# Patient Record
Sex: Male | Born: 1990
Health system: Southern US, Community
[De-identification: ages and names within clinical notes are randomized; demographics above are authoritative.]

## PROBLEM LIST (undated history)

## (undated) DIAGNOSIS — N200 Calculus of kidney: Secondary | ICD-10-CM

## (undated) DIAGNOSIS — Z87442 Personal history of urinary calculi: Secondary | ICD-10-CM

## (undated) DIAGNOSIS — F329 Major depressive disorder, single episode, unspecified: Secondary | ICD-10-CM

## (undated) DIAGNOSIS — Z8619 Personal history of other infectious and parasitic diseases: Secondary | ICD-10-CM

## (undated) DIAGNOSIS — B20 Human immunodeficiency virus [HIV] disease: Secondary | ICD-10-CM

## (undated) HISTORY — DX: Major depressive disorder, single episode, unspecified: F32.9

## (undated) HISTORY — DX: Personal history of other infectious and parasitic diseases: Z86.19

## (undated) HISTORY — DX: Human immunodeficiency virus (HIV) disease: B20

## (undated) HISTORY — PX: NOSE SURGERY: SHX723

## (undated) HISTORY — PX: FINGER SURGERY: SHX640

---
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
## 2013-01-10 ENCOUNTER — Encounter (HOSPITAL_COMMUNITY): Payer: Self-pay | Admitting: Emergency Medicine

## 2013-01-10 ENCOUNTER — Emergency Department (INDEPENDENT_AMBULATORY_CARE_PROVIDER_SITE_OTHER)
Admission: EM | Admit: 2013-01-10 | Discharge: 2013-01-10 | Disposition: A | Discharge status: Home / Self Care | Payer: Medicaid Other | Source: Home / Self Care | Attending: Family Medicine | Admitting: Family Medicine

## 2013-01-10 DIAGNOSIS — J069 Acute upper respiratory infection, unspecified: Secondary | ICD-10-CM

## 2013-01-10 DIAGNOSIS — B9789 Other viral agents as the cause of diseases classified elsewhere: Principal | ICD-10-CM

## 2013-01-10 MED ORDER — IPRATROPIUM BROMIDE 0.06 % NA SOLN: 2.0000 | Freq: Four times a day (QID) | NASAL | Status: DC

## 2013-01-10 MED ORDER — HYDROCODONE-ACETAMINOPHEN 5-325 MG PO TABS: 0.5000 | ORAL_TABLET | Freq: Every evening | ORAL | Status: DC | PRN

## 2013-01-10 NOTE — ED Provider Notes (Signed)
Steven [name] is a 23 y.o. male who presents to Urgent Care today for 2 weeks of cough congestion . No nausea vomiting diarrhea and no shortness of breath. Multiple sick contacts. No fevers or chills. Patient has tried multiple over-the-counter medications which have not helped much. He feels well otherwise.   History reviewed. No pertinent past medical history. History  Substance Use Topics  . Smoking status: Current Every Day Smoker    Types: Cigarettes  . Smokeless tobacco: Not on file  . Alcohol Use: Yes     Comment: occasional   ROS as above Medications reviewed. No current facility-administered medications for this encounter.   Current Outpatient Prescriptions  Medication Sig Dispense Refill  . HYDROcodone-acetaminophen (NORCO/VICODIN) 5-325 MG per tablet Take 0.5 tablets by mouth at bedtime as needed (cough).  6 tablet  0  . ipratropium (ATROVENT) 0.06 % nasal spray Place 2 sprays into both nostrils 4 (four) times daily.  15 mL  1  . lisdexamfetamine (VYVANSE) 40 MG capsule Take 40 mg by mouth every morning.          Exam:  BP 129/72  Pulse 77  Temp(Src) 98.6 F (37 C) (Oral)  SpO2 100% Gen: Well NAD HEENT: EOMI,  MMM posterior pharynx with cobblestoning. Tympanic membranes are normal appearing bilaterally. Lungs: Normal work of breathing. CTABL Heart: RRR no MRG Abd: NABS, Soft. NT, ND Exts: Non edematous BL  LE, warm and well perfused.    Assessment and Plan: 23 y.o. male with postviral cough. Plan treatment with Atrovent nasal spray and Norco cough medication. Followup with primary care provider if not getting better. Over-the-counter medications as needed. Discussed warning signs or symptoms. Please see discharge instructions. Patient expresses understanding.      Rodolph BongEvan S Halden Phegley, MD 01/10/13 [phone]

## 2013-01-10 NOTE — Discharge Instructions (Signed)
Thank you for coming in today. Use Atrovent nasal spray. Use one half tablet of Norco at night for cough as needed. Take 2 Aleve twice daily for pain fevers chills bodyaches. Call or go to the emergency room if you get worse, have trouble breathing, have chest pains, or palpitations.

## 2013-01-10 NOTE — ED Notes (Signed)
C/o  Persistent productive cough and chest congestion since 12/15. SOB. No relief with otc meds.  Denies fever but states felt really hot/feverish yesterday 1/5. No n/v/d

## 2013-03-11 ENCOUNTER — Emergency Department (INDEPENDENT_AMBULATORY_CARE_PROVIDER_SITE_OTHER)
Admission: EM | Admit: 2013-03-11 | Discharge: 2013-03-11 | Disposition: A | Discharge status: Home / Self Care | Payer: Medicaid Other | Source: Home / Self Care | Attending: Family Medicine | Admitting: Family Medicine

## 2013-03-11 ENCOUNTER — Emergency Department (INDEPENDENT_AMBULATORY_CARE_PROVIDER_SITE_OTHER): Payer: Medicaid Other

## 2013-03-11 ENCOUNTER — Encounter (HOSPITAL_COMMUNITY): Payer: Self-pay | Admitting: Emergency Medicine

## 2013-03-11 DIAGNOSIS — M549 Dorsalgia, unspecified: Secondary | ICD-10-CM

## 2013-03-11 LAB — POCT URINALYSIS DIP (DEVICE)
Bilirubin Urine: NEGATIVE
Glucose, UA: NEGATIVE mg/dL
Hgb urine dipstick: NEGATIVE
Ketones, ur: NEGATIVE mg/dL
Leukocytes, UA: NEGATIVE
Nitrite: NEGATIVE
Protein, ur: NEGATIVE mg/dL
Specific Gravity, Urine: 1.015 (ref 1.005–1.030)
Urobilinogen, UA: 0.2 mg/dL (ref 0.0–1.0)
pH: 8.5 — ABNORMAL HIGH (ref 5.0–8.0)

## 2013-03-11 MED ORDER — IBUPROFEN 800 MG PO TABS
800.0000 mg | ORAL_TABLET | Freq: Once | ORAL | Status: AC
  Administered 2013-03-11: 800 mg via ORAL

## 2013-03-11 MED ORDER — IBUPROFEN 800 MG PO TABS
ORAL_TABLET | ORAL | Status: AC
  Filled 2013-03-11: qty 1

## 2013-03-11 MED ORDER — DICLOFENAC SODIUM 50 MG PO TBEC: 50.0000 mg | DELAYED_RELEASE_TABLET | Freq: Three times a day (TID) | ORAL | Status: DC

## 2013-03-11 NOTE — Discharge Instructions (Signed)
Back Pain, Adult Low back pain is very common. About 1 in 5 people have back pain.The cause of low back pain is rarely dangerous. The pain often gets better over time.About half of people with a sudden onset of back pain feel better in just 2 weeks. About 8 in 10 people feel better by 6 weeks.  CAUSES Some common causes of back pain include:  Strain of the muscles or ligaments supporting the spine.  Wear and tear (degeneration) of the spinal discs.  Arthritis.  Direct injury to the back. DIAGNOSIS Most of the time, the direct cause of low back pain is not known.However, back pain can be treated effectively even when the exact cause of the pain is unknown.Answering your caregiver's questions about your overall health and symptoms is one of the most accurate ways to make sure the cause of your pain is not dangerous. If your caregiver needs more information, he or she may order lab work or imaging tests (X-rays or MRIs).However, even if imaging tests show changes in your back, this usually does not require surgery. HOME CARE INSTRUCTIONS For many people, back pain returns.Since low back pain is rarely dangerous, it is often a condition that people can learn to manageon their own.   Remain active. It is stressful on the back to sit or stand in one place. Do not sit, drive, or stand in one place for more than 30 minutes at a time. Take short walks on level surfaces as soon as pain allows.Try to increase the length of time you walk each day.  Do not stay in bed.Resting more than 1 or 2 days can delay your recovery.  Do not avoid exercise or work.Your body is made to move.It is not dangerous to be active, even though your back may hurt.Your back will likely heal faster if you return to being active before your pain is gone.  Pay attention to your body when you bend and lift. Many people have less discomfortwhen lifting if they bend their knees, keep the load close to their bodies,and  avoid twisting. Often, the most comfortable positions are those that put less stress on your recovering back.  Find a comfortable position to sleep. Use a firm mattress and lie on your side with your knees slightly bent. If you lie on your back, put a pillow under your knees.  Only take over-the-counter or prescription medicines as directed by your caregiver. Over-the-counter medicines to reduce pain and inflammation are often the most helpful.Your caregiver may prescribe muscle relaxant drugs.These medicines help dull your pain so you can more quickly return to your normal activities and healthy exercise.  Put ice on the injured area.  Put ice in a plastic bag.  Place a towel between your skin and the bag.  Leave the ice on for 15-20 minutes, 03-04 times a day for the first 2 to 3 days. After that, ice and heat may be alternated to reduce pain and spasms.  Ask your caregiver about trying back exercises and gentle massage. This may be of some benefit.  Avoid feeling anxious or stressed.Stress increases muscle tension and can worsen back pain.It is important to recognize when you are anxious or stressed and learn ways to manage it.Exercise is a great option. SEEK MEDICAL CARE IF:  You have pain that is not relieved with rest or medicine.  You have pain that does not improve in 1 week.  You have new symptoms.  You are generally not feeling well. SEEK   IMMEDIATE MEDICAL CARE IF:   You have pain that radiates from your back into your legs.  You develop new bowel or bladder control problems.  You have unusual weakness or numbness in your arms or legs.  You develop nausea or vomiting.  You develop abdominal pain.  You feel faint. Document Released: 12/22/2004 Document Revised: 06/23/2011 Document Reviewed: 05/12/2010 [employer] Patient Information 2014 [employer]. Back Exercises Back exercises help treat and prevent back injuries. The goal of back exercises is to increase  the strength of your abdominal and back muscles and the flexibility of your back. These exercises should be started when you no longer have back pain. Back exercises include:  Pelvic Tilt. Lie on your back with your knees bent. Tilt your pelvis until the lower part of your back is against the floor. Hold this position 5 to 10 sec and repeat 5 to 10 times.  Knee to Chest. Pull first 1 knee up against your chest and hold for 20 to 30 seconds, repeat this with the other knee, and then both knees. This may be done with the other leg straight or bent, whichever feels better.  Sit-Ups or Curl-Ups. Bend your knees 90 degrees. Start with tilting your pelvis, and do a partial, slow sit-up, lifting your trunk only 30 to 45 degrees off the floor. Take at least 2 to 3 seconds for each sit-up. Do not do sit-ups with your knees out straight. If partial sit-ups are difficult, simply do the above but with only tightening your abdominal muscles and holding it as directed.  Hip-Lift. Lie on your back with your knees flexed 90 degrees. Push down with your feet and shoulders as you raise your hips a couple inches off the floor; hold for 10 seconds, repeat 5 to 10 times.  Back arches. Lie on your stomach, propping yourself up on bent elbows. Slowly press on your hands, causing an arch in your low back. Repeat 3 to 5 times. Any initial stiffness and discomfort should lessen with repetition over time.  Shoulder-Lifts. Lie face down with arms beside your body. Keep hips and torso pressed to floor as you slowly lift your head and shoulders off the floor. Do not overdo your exercises, especially in the beginning. Exercises may cause you some mild back discomfort which lasts for a few minutes; however, if the pain is more severe, or lasts for more than 15 minutes, do not continue exercises until you see your caregiver. Improvement with exercise therapy for back problems is slow.  See your caregivers for assistance with  developing a proper back exercise program. Document Released: 01/30/2004 Document Revised: 03/16/2011 Document Reviewed: 10/23/2010 [employer] Patient Information 2014 [employer].  

## 2013-03-11 NOTE — ED Provider Notes (Signed)
CSN: [id number]     Arrival date & time 03/11/13  1522 History   First MD Initiated Contact with Patient 03/11/13 1729     Chief Complaint  Patient presents with  . Back Pain   (Consider location/radiation/quality/duration/timing/severity/associated sxs/prior Treatment) HPI Comments: 23 year old male presents for evaluation of lower back pain. This is been present for 3 days now. He has been taking over-the-counter medications without relief. He denies any injury. The pain is localized to his entire lower back. It does not radiate down his legs. He has no numbness in the genital area. No loss of bowel or bladder control.  Patient is a 23 y.o. male presenting with back pain.  Back Pain Associated symptoms: no abdominal pain, no chest pain, no dysuria, no fever, no numbness and no weakness     History reviewed. No pertinent past medical history. History reviewed. No pertinent past surgical history. History reviewed. No pertinent family history. History  Substance Use Topics  . Smoking status: Current Every Day Smoker    Types: Cigarettes  . Smokeless tobacco: Not on file  . Alcohol Use: Yes     Comment: occasional    Review of Systems  Constitutional: Negative for fever, chills and fatigue.  HENT: Negative for sore throat.   Eyes: Negative for visual disturbance.  Respiratory: Negative for cough and shortness of breath.   Cardiovascular: Negative for chest pain, palpitations and leg swelling.  Gastrointestinal: Negative for nausea, vomiting, abdominal pain, diarrhea and constipation.  Genitourinary: Negative for dysuria, urgency, frequency and hematuria.  Musculoskeletal: Positive for back pain. Negative for arthralgias, myalgias, neck pain and neck stiffness.  Skin: Negative for rash.  Neurological: Negative for dizziness, weakness, light-headedness and numbness.    Allergies  Review of patient's allergies indicates no known allergies.  Home Medications   Current Outpatient  Rx  Name  Route  Sig  Dispense  Refill  . diclofenac (VOLTAREN) 50 MG EC tablet   Oral   Take 1 tablet (50 mg total) by mouth 3 (three) times daily.   30 tablet   0   . HYDROcodone-acetaminophen (NORCO/VICODIN) 5-325 MG per tablet   Oral   Take 0.5 tablets by mouth at bedtime as needed (cough).   6 tablet   0   . ipratropium (ATROVENT) 0.06 % nasal spray   Each Nare   Place 2 sprays into both nostrils 4 (four) times daily.   15 mL   1   . lisdexamfetamine (VYVANSE) 40 MG capsule   Oral   Take 40 mg by mouth every morning.            BP 117/58  Pulse 83  Temp(Src) 98.7 F (37.1 C) (Oral)  Resp 20  SpO2 100% Physical Exam  Nursing note and vitals reviewed. Constitutional: He is oriented to person, place, and time. He appears well-developed and well-nourished. No distress.  HENT:  Head: Normocephalic.  Cardiovascular: Normal rate and regular rhythm.  Exam reveals friction rub.   No murmur heard. Pulmonary/Chest: Effort normal and breath sounds normal. No respiratory distress. He has no wheezes. He has no rales.  Musculoskeletal:       Lumbar back: He exhibits decreased range of motion, tenderness and bony tenderness.  Positive Waddell's signs - winces in pain when i lightly touched the skin of thoracic spine, but later denies pain in that area with palpation.  Cringes in severe pain with minimal extension of the legs with checking SLR but then observed walking with no difficulty.  Neurological: He is alert and oriented to person, place, and time. He has normal strength. He displays normal reflexes. No cranial nerve deficit or sensory deficit. He exhibits normal muscle tone. He displays a negative Romberg sign. Coordination and gait normal. GCS eye subscore is 4. GCS verbal subscore is 5. GCS motor subscore is 6.  Skin: Skin is warm and dry. No rash noted. He is not diaphoretic.  Psychiatric: He has a normal mood and affect. Judgment normal.    ED Course  Procedures  (including critical care time) Labs Review Labs Reviewed  POCT URINALYSIS DIP (DEVICE) - Abnormal; Notable for the following:    pH 8.5 (*)    All other components within normal limits   Imaging Review Dg Lumbar Spine Complete  03/11/2013   CLINICAL DATA:  Low back pain for 2 days.  EXAM: LUMBAR SPINE - COMPLETE 4+ VIEW  COMPARISON:  None.  FINDINGS: Mild levoscoliosis with the apex at L2. No fracture or spondylolisthesis. No degenerative changes. Normal soft tissues.  IMPRESSION: Mild levoscoliosis.  No other abnormality.   Electronically Signed   By: Amie Portland M.D.   On: 03/11/2013 18:30     MDM   1. Back pain    Mild levoscoliosis on the x-ray, otherwise normal. He is well appearing with normal vital signs, afebrile. No red flag symptoms. Followup with orthopedics if not improving. ED if he continues to worsen.   Meds ordered this encounter  Medications  . ibuprofen (ADVIL,MOTRIN) tablet 800 mg    Sig:   . diclofenac (VOLTAREN) 50 MG EC tablet    Sig: Take 1 tablet (50 mg total) by mouth 3 (three) times daily.    Dispense:  30 tablet    Refill:  0    Order Specific Question:  Supervising Provider    Answer:  Bradd Canary D [[id number]]       Graylon Good, PA-C 03/11/13 [phone]

## 2013-03-11 NOTE — ED Notes (Signed)
C/o  Severe lower back pain.   Pain is mainly in the center of the back.  Denies injury or heavy lifting/denies urinary symptoms.  On set 3 days ago.  No relief with otc meds.

## 2013-03-11 NOTE — ED Provider Notes (Signed)
Medical screening examination/treatment/procedure(s) were performed by resident physician or non-physician practitioner and as supervising physician I was immediately available for consultation/collaboration.   Ramiel Forti DOUGLAS MD.   Sefora Tietje D Kamie Korber, MD 03/11/13 1954 

## 2014-11-23 ENCOUNTER — Emergency Department (INDEPENDENT_AMBULATORY_CARE_PROVIDER_SITE_OTHER)
Admission: EM | Admit: 2014-11-23 | Discharge: 2014-11-23 | Disposition: A | Discharge status: Home / Self Care | Payer: Medicaid Other | Source: Home / Self Care

## 2014-11-23 ENCOUNTER — Encounter (HOSPITAL_COMMUNITY): Payer: Self-pay | Admitting: Emergency Medicine

## 2014-11-23 DIAGNOSIS — L259 Unspecified contact dermatitis, unspecified cause: Secondary | ICD-10-CM | POA: Diagnosis not present

## 2014-11-23 MED ORDER — PREDNISONE 20 MG PO TABS: ORAL_TABLET | ORAL | Status: DC

## 2014-11-23 NOTE — Discharge Instructions (Signed)
Contact Dermatitis Recommend taken in any histamines such as Allegra or Claritin daily. Dermatitis is redness, soreness, and swelling (inflammation) of the skin. Contact dermatitis is a reaction to certain substances that touch the skin. There are two types of contact dermatitis:   Irritant contact dermatitis. This type is caused by something that irritates your skin, such as dry hands from washing them too much. This type does not require previous exposure to the substance for a reaction to occur. This type is more common.  Allergic contact dermatitis. This type is caused by a substance that you are allergic to, such as a nickel allergy or poison ivy. This type only occurs if you have been exposed to the substance (allergen) before. Upon a repeat exposure, your body reacts to the substance. This type is less common. CAUSES  Many different substances can cause contact dermatitis. Irritant contact dermatitis is most commonly caused by exposure to:   Makeup.   Soaps.   Detergents.   Bleaches.   Acids.   Metal salts, such as nickel.  Allergic contact dermatitis is most commonly caused by exposure to:   Poisonous plants.   Chemicals.   Jewelry.   Latex.   Medicines.   Preservatives in products, such as clothing.  RISK FACTORS This condition is more likely to develop in:   People who have jobs that expose them to irritants or allergens.  People who have certain medical conditions, such as asthma or eczema.  SYMPTOMS  Symptoms of this condition may occur anywhere on your body where the irritant has touched you or is touched by you. Symptoms include:  Dryness or flaking.   Redness.   Cracks.   Itching.   Pain or a burning feeling.   Blisters.  Drainage of small amounts of blood or clear fluid from skin cracks. With allergic contact dermatitis, there may also be swelling in areas such as the eyelids, mouth, or genitals.  DIAGNOSIS  This condition is  diagnosed with a medical history and physical exam. A patch skin test may be performed to help determine the cause. If the condition is related to your job, you may need to see an occupational medicine specialist. TREATMENT Treatment for this condition includes figuring out what caused the reaction and protecting your skin from further contact. Treatment may also include:   Steroid creams or ointments. Oral steroid medicines may be needed in more severe cases.  Antibiotics or antibacterial ointments, if a skin infection is present.  Antihistamine lotion or an antihistamine taken by mouth to ease itching.  A bandage (dressing). HOME CARE INSTRUCTIONS Skin Care  Moisturize your skin as needed.   Apply cool compresses to the affected areas.  Try taking a bath with:  Epsom salts. Follow the instructions on the packaging. You can get these at your local pharmacy or grocery store.  Baking soda. Pour a small amount into the bath as directed by your health care provider.  Colloidal oatmeal. Follow the instructions on the packaging. You can get this at your local pharmacy or grocery store.  Try applying baking soda paste to your skin. Stir water into baking soda until it reaches a paste-like consistency.  Do not scratch your skin.  Bathe less frequently, such as every other day.  Bathe in lukewarm water. Avoid using hot water. Medicines  Take or apply over-the-counter and prescription medicines only as told by your health care provider.   If you were prescribed an antibiotic medicine, take or apply your antibiotic as  told by your health care provider. Do not stop using the antibiotic even if your condition starts to improve. General Instructions  Keep all follow-up visits as told by your health care provider. This is important.  Avoid the substance that caused your reaction. If you do not know what caused it, keep a journal to try to track what caused it. Write down:  What you  eat.  What cosmetic products you use.  What you drink.  What you wear in the affected area. This includes jewelry.  If you were given a dressing, take care of it as told by your health care provider. This includes when to change and remove it. SEEK MEDICAL CARE IF:   Your condition does not improve with treatment.  Your condition gets worse.  You have signs of infection such as swelling, tenderness, redness, soreness, or warmth in the affected area.  You have a fever.  You have new symptoms. SEEK IMMEDIATE MEDICAL CARE IF:   You have a severe headache, neck pain, or neck stiffness.  You vomit.  You feel very sleepy.  You notice red streaks coming from the affected area.  Your bone or joint underneath the affected area becomes painful after the skin has healed.  The affected area turns darker.  You have difficulty breathing.   This information is not intended to replace advice given to you by your health care provider. Make sure you discuss any questions you have with your health care provider.   Document Released: 12/20/1999 Document Revised: 09/12/2014 Document Reviewed: 05/09/2014 Elsevier Interactive Patient Education [employer]

## 2014-11-23 NOTE — ED Notes (Signed)
C/o rash all over face, abd, back, chest and arms onset x3 days Denies new meds, hygiene products, foods A&O x4... No acute distress.

## 2014-11-23 NOTE — ED Provider Notes (Signed)
CSN: [id number]     Arrival date & time 11/23/14  1301 History   None    Chief Complaint  Patient presents with  . Rash   (Consider location/radiation/quality/duration/timing/severity/associated sxs/prior Treatment) HPI Comments: 24 year old male presents with a generalized itchy rash. It is almost body surface areas. It occurred approximately 2 days ago. Patient states he is uncertain as to what may caused it. He denies any known swelling. No cough or breathing problems.   History reviewed. No pertinent past medical history. History reviewed. No pertinent past surgical history. No family history on file. Social History  Substance Use Topics  . Smoking status: Current Every Day Smoker    Types: Cigarettes  . Smokeless tobacco: None  . Alcohol Use: Yes     Comment: occasional    Review of Systems  Constitutional: Negative.   HENT: Negative.   Respiratory: Negative.  Negative for cough and shortness of breath.   Cardiovascular: Negative for chest pain.  Skin: Positive for rash.       As per history of present illness  Neurological: Negative.     Allergies  Review of patient's allergies indicates no known allergies.  Home Medications   Prior to Admission medications   Medication Sig Start Date End Date Taking? Authorizing Provider  diclofenac (VOLTAREN) 50 MG EC tablet Take 1 tablet (50 mg total) by mouth 3 (three) times daily. 03/11/13   Graylon GoodZachary H Baker, PA-C  HYDROcodone-acetaminophen (NORCO/VICODIN) 5-325 MG per tablet Take 0.5 tablets by mouth at bedtime as needed (cough). 01/10/13   Rodolph BongEvan S Corey, MD  ipratropium (ATROVENT) 0.06 % nasal spray Place 2 sprays into both nostrils 4 (four) times daily. 01/10/13   Rodolph BongEvan S Corey, MD  lisdexamfetamine (VYVANSE) 40 MG capsule Take 40 mg by mouth every morning.      Historical Provider, MD  predniSONE (DELTASONE) 20 MG tablet 3 Tabs PO Days 1-3, then 2 tabs PO Days 4-6, then 1 tab PO Day 7-9, then Half Tab PO Day 10-12. Take with food.  11/23/14   Hayden Rasmussenavid Vester Titsworth, NP   Meds Ordered and Administered this Visit  Medications - No data to display  BP 114/72 mmHg  Pulse 96  Temp(Src) 99.5 F (37.5 C) (Oral)  Resp 16  SpO2 100% No data found.   Physical Exam  Constitutional: He is oriented to person, place, and time. He appears well-developed and well-nourished. No distress.  HENT:  Mouth/Throat: Oropharynx is clear and moist.  No intraoral swelling, edema, erythema or other discoloration. Swallowing reflex is normal. Airway is widely patent.  Eyes: EOM are normal.  Neck: Normal range of motion.  Cardiovascular: Normal rate and regular rhythm.   Pulmonary/Chest: Effort normal and breath sounds normal. No respiratory distress. He has no wheezes. He has no rales.  Musculoskeletal: Normal range of motion. He exhibits no edema.  Neurological: He is alert and oriented to person, place, and time. He exhibits normal muscle tone.  Skin: Skin is warm and dry. Rash noted.  Generalized papular rash, mostly erythematous. No drainage, purulence or signs of bacterial infection.  Psychiatric: He has a normal mood and affect.  Nursing note and vitals reviewed.   ED Course  Procedures (including critical care time)  Labs Review Labs Reviewed - No data to display  Imaging Review No results found.   Visual Acuity Review  Right Eye Distance:   Left Eye Distance:   Bilateral Distance:    Right Eye Near:   Left Eye Near:  Bilateral Near:         MDM   1. Contact dermatitis    Prednisone taper dose Recommend taken in any histamines such as Allegra or Claritin daily.     Hayden Rasmussen, NP 11/23/14 1350

## 2015-01-24 ENCOUNTER — Emergency Department (HOSPITAL_COMMUNITY): Payer: Medicaid Other

## 2015-01-24 ENCOUNTER — Encounter (HOSPITAL_COMMUNITY): Payer: Self-pay | Admitting: Emergency Medicine

## 2015-01-24 ENCOUNTER — Emergency Department (HOSPITAL_COMMUNITY)
Admission: EM | Admit: 2015-01-24 | Discharge: 2015-01-24 | Disposition: A | Discharge status: Home / Self Care | Payer: Medicaid Other | Attending: Emergency Medicine | Admitting: Emergency Medicine

## 2015-01-24 DIAGNOSIS — F17210 Nicotine dependence, cigarettes, uncomplicated: Secondary | ICD-10-CM | POA: Insufficient documentation

## 2015-01-24 DIAGNOSIS — R1032 Left lower quadrant pain: Secondary | ICD-10-CM | POA: Diagnosis present

## 2015-01-24 DIAGNOSIS — N201 Calculus of ureter: Secondary | ICD-10-CM | POA: Insufficient documentation

## 2015-01-24 HISTORY — DX: Calculus of kidney: N20.0

## 2015-01-24 LAB — URINALYSIS, ROUTINE W REFLEX MICROSCOPIC
Glucose, UA: NEGATIVE mg/dL
Ketones, ur: >80 mg/dL — AB
Leukocytes, UA: NEGATIVE
Nitrite: NEGATIVE
Protein, ur: 100 mg/dL — AB
Specific Gravity, Urine: 1.026 (ref 1.005–1.030)
pH: 5.5 (ref 5.0–8.0)

## 2015-01-24 LAB — COMPREHENSIVE METABOLIC PANEL
ALT: 20 U/L (ref 17–63)
AST: 23 U/L (ref 15–41)
Albumin: 4.8 g/dL (ref 3.5–5.0)
Alkaline Phosphatase: 56 U/L (ref 38–126)
Anion gap: 15 (ref 5–15)
BUN: 9 mg/dL (ref 6–20)
CO2: 19 mmol/L — ABNORMAL LOW (ref 22–32)
Calcium: 9.7 mg/dL (ref 8.9–10.3)
Chloride: 104 mmol/L (ref 101–111)
Creatinine, Ser: 1.17 mg/dL (ref 0.61–1.24)
GFR calc Af Amer: >60 mL/min (ref >60)
GFR calc non Af Amer: >60 mL/min (ref >60)
Glucose, Bld: 146 mg/dL — ABNORMAL HIGH (ref 65–99)
Potassium: 4 mmol/L (ref 3.5–5.1)
Sodium: 138 mmol/L (ref 135–145)
Total Bilirubin: 1.1 mg/dL (ref 0.3–1.2)
Total Protein: 7.3 g/dL (ref 6.5–8.1)

## 2015-01-24 LAB — CBC
HCT: 43.8 % (ref 39.0–52.0)
Hemoglobin: 15.6 g/dL (ref 13.0–17.0)
MCH: 30 pg (ref 26.0–34.0)
MCHC: 35.6 g/dL (ref 30.0–36.0)
MCV: 84.2 fL (ref 78.0–100.0)
Platelets: 146 10*3/uL — ABNORMAL LOW (ref 150–400)
RBC: 5.2 MIL/uL (ref 4.22–5.81)
RDW: 12.7 % (ref 11.5–15.5)
WBC: 8.5 10*3/uL (ref 4.0–10.5)

## 2015-01-24 LAB — URINE MICROSCOPIC-ADD ON

## 2015-01-24 LAB — LIPASE, BLOOD: Lipase: 24 U/L (ref 11–51)

## 2015-01-24 MED ORDER — ONDANSETRON HCL 4 MG/2ML IJ SOLN
4.0000 mg | Freq: Once | INTRAMUSCULAR | Status: AC
  Administered 2015-01-24: 4 mg via INTRAVENOUS
  Filled 2015-01-24: qty 2

## 2015-01-24 MED ORDER — SODIUM CHLORIDE 0.9 % IV SOLN
1000.0000 mL | Freq: Once | INTRAVENOUS | Status: AC
  Administered 2015-01-24: 1000 mL via INTRAVENOUS

## 2015-01-24 MED ORDER — ONDANSETRON HCL 4 MG PO TABS: 4.0000 mg | ORAL_TABLET | Freq: Four times a day (QID) | ORAL | Status: DC

## 2015-01-24 MED ORDER — SODIUM CHLORIDE 0.9 % IV SOLN: 1000.0000 mL | INTRAVENOUS | Status: DC

## 2015-01-24 MED ORDER — HYDROMORPHONE HCL 1 MG/ML IJ SOLN
0.5000 mg | INTRAMUSCULAR | Status: DC | PRN
  Administered 2015-01-24 (×2): 0.5 mg via INTRAVENOUS
  Filled 2015-01-24 (×2): qty 1

## 2015-01-24 MED ORDER — HYDROCODONE-ACETAMINOPHEN 5-325 MG PO TABS: 1.0000 | ORAL_TABLET | ORAL | Status: DC | PRN

## 2015-01-24 MED ORDER — FENTANYL CITRATE (PF) 100 MCG/2ML IJ SOLN
50.0000 ug | Freq: Once | INTRAMUSCULAR | Status: AC
  Administered 2015-01-24: 50 ug via INTRAVENOUS

## 2015-01-24 MED ORDER — ONDANSETRON HCL 4 MG/2ML IJ SOLN
4.0000 mg | Freq: Once | INTRAMUSCULAR | Status: AC
  Administered 2015-01-24: 4 mg via INTRAVENOUS

## 2015-01-24 MED ORDER — ONDANSETRON HCL 4 MG/2ML IJ SOLN
INTRAMUSCULAR | Status: AC
  Administered 2015-01-24: 4 mg via INTRAVENOUS
  Filled 2015-01-24: qty 2

## 2015-01-24 MED ORDER — FENTANYL CITRATE (PF) 100 MCG/2ML IJ SOLN
INTRAMUSCULAR | Status: AC
  Administered 2015-01-24: 50 ug via INTRAVENOUS
  Filled 2015-01-24: qty 2

## 2015-01-24 NOTE — ED Provider Notes (Signed)
CSN: [id number]     Arrival date & time 01/24/15  0804 History   First MD Initiated Contact with Patient 01/24/15 [phone]     Chief Complaint  Patient presents with  . Flank Pain  . Abdominal Pain   Patient is a 25 y.o. male presenting with flank pain and abdominal pain. The history is provided by the patient.  Flank Pain This is a new problem. The current episode started 3 to 5 hours ago. The problem occurs constantly. The problem has not changed since onset.Associated symptoms include abdominal pain. Pertinent negatives include no chest pain, no headaches and no shortness of breath. Nothing aggravates the symptoms. Nothing relieves the symptoms.  Abdominal Pain Pain location:  LLQ Pain quality: sharp   Pain radiates to:  Back Onset quality:  Sudden Relieved by:  Nothing Associated symptoms: hematuria   Associated symptoms: no chest pain, no cough, no diarrhea, no dysuria, no fever, no shortness of breath and no vomiting   Risk factors comment:  Hx of prior kidney stones and this feels similar   Past Medical History  Diagnosis Date  . Kidney stone    History reviewed. No pertinent past surgical history. History reviewed. No pertinent family history. Social History  Substance Use Topics  . Smoking status: Current Every Day Smoker    Types: Cigarettes  . Smokeless tobacco: None  . Alcohol Use: Yes     Comment: occasional    Review of Systems  Constitutional: Negative for fever.  Respiratory: Negative for cough and shortness of breath.   Cardiovascular: Negative for chest pain.  Gastrointestinal: Positive for abdominal pain. Negative for vomiting and diarrhea.  Genitourinary: Positive for hematuria and flank pain. Negative for dysuria.  Neurological: Negative for headaches.  All other systems reviewed and are negative.     Allergies  Review of patient's allergies indicates no known allergies.  Home Medications   Prior to Admission medications   Medication Sig Start  Date End Date Taking? Authorizing Provider  HYDROcodone-acetaminophen (NORCO/VICODIN) 5-325 MG tablet Take 1-2 tablets by mouth every 4 (four) hours as needed. 01/24/15   Linwood Dibbles, MD  ondansetron (ZOFRAN) 4 MG tablet Take 1 tablet (4 mg total) by mouth every 6 (six) hours. 01/24/15   Linwood Dibbles, MD   BP 102/63 mmHg  Pulse 70  Temp(Src) 98.1 F (36.7 C) (Oral)  Resp 18  SpO2 98% Physical Exam  Constitutional: He appears well-developed and well-nourished. No distress.  HENT:  Head: Normocephalic and atraumatic.  Right Ear: External ear normal.  Left Ear: External ear normal.  Eyes: Conjunctivae are normal. Right eye exhibits no discharge. Left eye exhibits no discharge. No scleral icterus.  Neck: Neck supple. No tracheal deviation present.  Cardiovascular: Normal rate, regular rhythm and intact distal pulses.   Pulmonary/Chest: Effort normal and breath sounds normal. No stridor. No respiratory distress. He has no wheezes. He has no rales.  Abdominal: Soft. Bowel sounds are normal. He exhibits no distension. There is generalized tenderness. There is guarding. There is no rigidity and no rebound. No hernia.  Musculoskeletal: He exhibits no edema or tenderness.  Neurological: He is alert. He has normal strength. No cranial nerve deficit (no facial droop, extraocular movements intact, no slurred speech) or sensory deficit. He exhibits normal muscle tone. He displays no seizure activity. Coordination normal.  Skin: Skin is warm and dry. No rash noted.  Psychiatric: He has a normal mood and affect.  Nursing note and vitals reviewed.   ED Course  Procedures (including critical care time) Labs Review Labs Reviewed  COMPREHENSIVE METABOLIC PANEL - Abnormal; Notable for the following:    CO2 19 (*)    Glucose, Bld 146 (*)    All other components within normal limits  CBC - Abnormal; Notable for the following:    Platelets 146 (*)    All other components within normal limits  URINALYSIS,  ROUTINE W REFLEX MICROSCOPIC (NOT AT [facility]) - Abnormal; Notable for the following:    Color, Urine AMBER (*)    APPearance HAZY (*)    Hgb urine dipstick LARGE (*)    Bilirubin Urine SMALL (*)    Ketones, ur >80 (*)    Protein, ur 100 (*)    All other components within normal limits  URINE MICROSCOPIC-ADD ON - Abnormal; Notable for the following:    Squamous Epithelial / LPF 0-5 (*)    Bacteria, UA MANY (*)    All other components within normal limits  LIPASE, BLOOD    Imaging Review Ct Abdomen Pelvis Wo Contrast  01/24/2015  CLINICAL DATA:  Left flank pain with nausea and vomiting for 1 day EXAM: CT ABDOMEN AND PELVIS WITHOUT CONTRAST TECHNIQUE: Multidetector CT imaging of the abdomen and pelvis was performed following the standard protocol without oral or intravenous contrast material administration. COMPARISON:  November 01, 2008 FINDINGS: Lower chest: There is mild right posterior basilar atelectasis. Lung bases otherwise are clear. Hepatobiliary: No focal liver lesions are identified on this noncontrast enhanced study. Gallbladder wall is not appreciably thickened. There is no biliary duct dilatation. Pancreas: No pancreatic mass or inflammation. Spleen: No splenic lesions are identified. Adrenals/Urinary Tract: Adrenals appear normal bilaterally. There is no renal mass on either side. On the right, there is no hydronephrosis. There is no right-sided renal or ureteral calculus. On the left, there is mild hydronephrosis. There is no intrarenal calculus. There is a 2 mm calculus on the left in the proximal ureter at the L2-3 level. No other ureteral calculus is appreciable. Urinary bladder is midline with wall thickness within normal limits. Stomach/Bowel: There is no bowel obstruction. No free air or portal venous air. No bowel wall or mesenteric thickening. Vascular/Lymphatic: There is no abdominal aortic aneurysm. No vascular lesions are identified on this noncontrast enhanced study. There is no  adenopathy in the abdomen or pelvis. There are scattered subcentimeter mesenteric lymph nodes in the right abdomen. Reproductive: Prostate and seminal vesicles appear normal. No pelvic mass or pelvic fluid collection. Other: Appendix appears normal. No abscess or ascites in the abdomen or pelvis. Musculoskeletal: There are no blastic or lytic bone lesions. There are Schmorl's nodes in several lower thoracic vertebral bodies. No intramuscular or abdominal wall lesions. IMPRESSION: 2 mm calculus proximal left ureter at the L2-3 level with mild hydronephrosis on the left. No intrarenal calculi on either side. No right-sided hydronephrosis or ureteral calculus. Several subcentimeter lymph nodes in the right mid to lower abdomen are regarded as nonspecific. No adenopathy by size criteria is appreciable in the abdomen or pelvis. Appendix appears normal.  No bowel obstruction.  No abscess. Electronically Signed   By: Bretta Bang III M.D.   On: 01/24/2015 12:04   I have personally reviewed and evaluated these images and lab results as part of my medical decision-making.  Medications  0.9 %  sodium chloride infusion (1,000 mLs Intravenous New Bag/Given 01/24/15 1015)    Followed by  0.9 %  sodium chloride infusion (1,000 mLs Intravenous New Bag/Given 01/24/15 1016)  Followed by  0.9 %  sodium chloride infusion (not administered)  HYDROmorphone (DILAUDID) injection 0.5 mg (0.5 mg Intravenous Given 01/24/15 1145)  fentaNYL (SUBLIMAZE) injection 50 mcg (50 mcg Intravenous Given 01/24/15 0826)  ondansetron (ZOFRAN) injection 4 mg (4 mg Intravenous Given 01/24/15 0826)  ondansetron (ZOFRAN) injection 4 mg (4 mg Intravenous Given 01/24/15 1016)     MDM   Final diagnoses:  Left ureteral stone    The patient's CT scan shows a 2 mm left-sided ureteral stone. Labs were unremarkable with the exception of hematuria. This is consistent with his history and exam.  The patient's pain improved with treatment in the  emergency room.  Dc home with pain meds and antiemetics, follow up with urology    Linwood Dibbles, MD 01/24/15 1243

## 2015-01-24 NOTE — ED Notes (Signed)
Pt sts left sided flank and abd pain into back with some nausea and diarrhea starting last night; pt sts hx of kidney stones and feels same

## 2015-01-24 NOTE — ED Notes (Signed)
Patient transported to CT 

## 2015-01-24 NOTE — Discharge Instructions (Signed)
Kidney Stones °Kidney stones (urolithiasis) are deposits that form inside your kidneys. The intense pain is caused by the stone moving through the urinary tract. When the stone moves, the ureter goes into spasm around the stone. The stone is usually passed in the urine.  °CAUSES  °· A disorder that makes certain neck glands produce too much parathyroid hormone (primary hyperparathyroidism). °· A buildup of uric acid crystals, similar to gout in your joints. °· Narrowing (stricture) of the ureter. °· A kidney obstruction present at birth (congenital obstruction). °· Previous surgery on the kidney or ureters. °· Numerous kidney infections. °SYMPTOMS  °· Feeling sick to your stomach (nauseous). °· Throwing up (vomiting). °· Blood in the urine (hematuria). °· Pain that usually spreads (radiates) to the groin. °· Frequency or urgency of urination. °DIAGNOSIS  °· Taking a history and physical exam. °· Blood or urine tests. °· CT scan. °· Occasionally, an examination of the inside of the urinary bladder (cystoscopy) is performed. °TREATMENT  °· Observation. °· Increasing your fluid intake. °· Extracorporeal shock wave lithotripsy--This is a noninvasive procedure that uses shock waves to break up kidney stones. °· Surgery may be needed if you have severe pain or persistent obstruction. There are various surgical procedures. Most of the procedures are performed with the use of small instruments. Only small incisions are needed to accommodate these instruments, so recovery time is minimized. °The size, location, and chemical composition are all important variables that will determine the proper choice of action for you. Talk to your health care provider to better understand your situation so that you will minimize the risk of injury to yourself and your kidney.  °HOME CARE INSTRUCTIONS  °· Drink enough water and fluids to keep your urine clear or pale yellow. This will help you to pass the stone or stone fragments. °· Strain  all urine through the provided strainer. Keep all particulate matter and stones for your health care provider to see. The stone causing the pain may be as small as a grain of salt. It is very important to use the strainer each and every time you pass your urine. The collection of your stone will allow your health care provider to analyze it and verify that a stone has actually passed. The stone analysis will often identify what you can do to reduce the incidence of recurrences. °· Only take over-the-counter or prescription medicines for pain, discomfort, or fever as directed by your health care provider. °· Keep all follow-up visits as told by your health care provider. This is important. °· Get follow-up X-rays if required. The absence of pain does not always mean that the stone has passed. It may have only stopped moving. If the urine remains completely obstructed, it can cause loss of kidney function or even complete destruction of the kidney. It is your responsibility to make sure X-rays and follow-ups are completed. Ultrasounds of the kidney can show blockages and the status of the kidney. Ultrasounds are not associated with any radiation and can be performed easily in a matter of minutes. °· Make changes to your daily diet as told by your health care provider. You may be told to: °¨ Limit the amount of salt that you eat. °¨ Eat 5 or more servings of fruits and vegetables each day. °¨ Limit the amount of meat, poultry, fish, and eggs that you eat. °· Collect a 24-hour urine sample as told by your health care provider. You may need to collect another urine sample every 6-12   months. °SEEK MEDICAL CARE IF: °· You experience pain that is progressive and unresponsive to any pain medicine you have been prescribed. °SEEK IMMEDIATE MEDICAL CARE IF:  °· Pain cannot be controlled with the prescribed medicine. °· You have a fever or shaking chills. °· The severity or intensity of pain increases over 18 hours and is not  relieved by pain medicine. °· You develop a new onset of abdominal pain. °· You feel faint or pass out. °· You are unable to urinate. °  °This information is not intended to replace advice given to you by your health care provider. Make sure you discuss any questions you have with your health care provider. °  °Document Released: 12/22/2004 Document Revised: 09/12/2014 Document Reviewed: 05/25/2012 °Elsevier Interactive Patient Education ©2016 Elsevier Inc. ° °

## 2015-01-30 ENCOUNTER — Encounter (HOSPITAL_COMMUNITY): Payer: Self-pay

## 2015-01-30 ENCOUNTER — Emergency Department (HOSPITAL_COMMUNITY)
Admission: EM | Admit: 2015-01-30 | Discharge: 2015-01-30 | Disposition: A | Discharge status: Home / Self Care | Payer: Medicaid Other | Attending: Emergency Medicine | Admitting: Emergency Medicine

## 2015-01-30 DIAGNOSIS — F17210 Nicotine dependence, cigarettes, uncomplicated: Secondary | ICD-10-CM | POA: Diagnosis not present

## 2015-01-30 DIAGNOSIS — N200 Calculus of kidney: Secondary | ICD-10-CM | POA: Insufficient documentation

## 2015-01-30 DIAGNOSIS — R1032 Left lower quadrant pain: Secondary | ICD-10-CM | POA: Diagnosis present

## 2015-01-30 DIAGNOSIS — R112 Nausea with vomiting, unspecified: Secondary | ICD-10-CM

## 2015-01-30 LAB — BASIC METABOLIC PANEL
Anion gap: 10 (ref 5–15)
BUN: 8 mg/dL (ref 6–20)
CO2: 25 mmol/L (ref 22–32)
Calcium: 9.1 mg/dL (ref 8.9–10.3)
Chloride: 102 mmol/L (ref 101–111)
Creatinine, Ser: 1.05 mg/dL (ref 0.61–1.24)
GFR calc Af Amer: >60 mL/min (ref >60)
GFR calc non Af Amer: >60 mL/min (ref >60)
Glucose, Bld: 104 mg/dL — ABNORMAL HIGH (ref 65–99)
Potassium: 3.6 mmol/L (ref 3.5–5.1)
Sodium: 137 mmol/L (ref 135–145)

## 2015-01-30 LAB — URINALYSIS, ROUTINE W REFLEX MICROSCOPIC
Bilirubin Urine: NEGATIVE
Glucose, UA: NEGATIVE mg/dL
Ketones, ur: 40 mg/dL — AB
Leukocytes, UA: NEGATIVE
Nitrite: NEGATIVE
Protein, ur: NEGATIVE mg/dL
Specific Gravity, Urine: 1.013 (ref 1.005–1.030)
pH: 6 (ref 5.0–8.0)

## 2015-01-30 LAB — CBC WITH DIFFERENTIAL/PLATELET
Basophils Absolute: 0 10*3/uL (ref 0.0–0.1)
Basophils Relative: 1 %
Eosinophils Absolute: 0.1 10*3/uL (ref 0.0–0.7)
Eosinophils Relative: 2 %
HCT: 41.9 % (ref 39.0–52.0)
Hemoglobin: 14.4 g/dL (ref 13.0–17.0)
Lymphocytes Relative: 37 %
Lymphs Abs: 1.9 10*3/uL (ref 0.7–4.0)
MCH: 29.2 pg (ref 26.0–34.0)
MCHC: 34.4 g/dL (ref 30.0–36.0)
MCV: 85 fL (ref 78.0–100.0)
Monocytes Absolute: 0.6 10*3/uL (ref 0.1–1.0)
Monocytes Relative: 11 %
Neutro Abs: 2.6 10*3/uL (ref 1.7–7.7)
Neutrophils Relative %: 49 %
Platelets: 165 10*3/uL (ref 150–400)
RBC: 4.93 MIL/uL (ref 4.22–5.81)
RDW: 12.6 % (ref 11.5–15.5)
WBC: 5.1 10*3/uL (ref 4.0–10.5)

## 2015-01-30 LAB — URINE MICROSCOPIC-ADD ON

## 2015-01-30 MED ORDER — ONDANSETRON 4 MG PO TBDP
4.0000 mg | ORAL_TABLET | Freq: Once | ORAL | Status: AC
  Administered 2015-01-30: 4 mg via ORAL
  Filled 2015-01-30: qty 1

## 2015-01-30 MED ORDER — OXYCODONE-ACETAMINOPHEN 5-325 MG PO TABS: 1.0000 | ORAL_TABLET | Freq: Four times a day (QID) | ORAL | Status: DC | PRN

## 2015-01-30 MED ORDER — OXYCODONE-ACETAMINOPHEN 5-325 MG PO TABS
1.0000 | ORAL_TABLET | Freq: Once | ORAL | Status: AC
  Administered 2015-01-30: 1 via ORAL
  Filled 2015-01-30 (×2): qty 1

## 2015-01-30 MED ORDER — TAMSULOSIN HCL 0.4 MG PO CAPS: 0.4000 mg | ORAL_CAPSULE | Freq: Every day | ORAL | Status: DC

## 2015-01-30 MED ORDER — ONDANSETRON 4 MG PO TBDP: 4.0000 mg | ORAL_TABLET | Freq: Three times a day (TID) | ORAL | Status: DC | PRN

## 2015-01-30 NOTE — ED Notes (Signed)
Per EMS, pt from home.  Pt seen on 19th for same and dx with kidney stone.  Pt ran out of meds.  Pt c/o n/v and same pain.  Pt Had no n/v in route.  Vitals: 148/78, hr 70, 96% ra, resp 16.  Pain 7/10

## 2015-01-30 NOTE — ED Provider Notes (Signed)
CSN: [id number]     Arrival date & time 01/30/15  1227 History   First MD Initiated Contact with Patient 01/30/15 1412     Chief Complaint  Patient presents with  . Abdominal Pain     (Consider location/radiation/quality/duration/timing/severity/associated sxs/prior Treatment) HPI Comments: 25yo M w/ recent diagnosis kidney stone who p/w LLQ abdominal pain. Pt was evaluated here on 1/19 for LLQ pain radiating to flank. Workup revealed a 2 mm ureteral stone. He was discharged home with instructions to follow-up with urology and he states that he has a follow-up appointment tomorrow. He initially felt better when he was discharged home but states that the pain returned in the same area and he has continued to have severe abdominal pain associated with the same vomiting that he had previously. He ran out of pain medication at home. He has had some sweating but has not measured his temperature. He has not noticed any blood in his urine and he denies any dysuria.  Patient is a 25 y.o. male presenting with abdominal pain. The history is provided by the patient.  Abdominal Pain   Past Medical History  Diagnosis Date  . Kidney stone   . Kidney stone    History reviewed. No pertinent past surgical history. History reviewed. No pertinent family history. Social History  Substance Use Topics  . Smoking status: Current Every Day Smoker    Types: Cigarettes  . Smokeless tobacco: None  . Alcohol Use: Yes     Comment: occasional    Review of Systems  Gastrointestinal: Positive for abdominal pain.   10 Systems reviewed and are negative for acute change except as noted in the HPI.    Allergies  Review of patient's allergies indicates no known allergies.  Home Medications   Prior to Admission medications   Medication Sig Start Date End Date Taking? Authorizing Provider  ondansetron (ZOFRAN ODT) 4 MG disintegrating tablet Take 1 tablet (4 mg total) by mouth every 8 (eight) hours as needed  for nausea or vomiting. 01/30/15   Laurence Spates, MD  oxyCODONE-acetaminophen (PERCOCET) 5-325 MG tablet Take 1 tablet by mouth every 6 (six) hours as needed for severe pain. 01/30/15   Laurence Spates, MD  tamsulosin (FLOMAX) 0.4 MG CAPS capsule Take 1 capsule (0.4 mg total) by mouth daily. 01/30/15   Ambrose Finland Keirstyn Aydt, MD   BP 130/77 mmHg  Pulse 62  Temp(Src) 98.5 F (36.9 C) (Oral)  Resp 16  SpO2 99% Physical Exam  Constitutional: He is oriented to person, place, and time. He appears well-developed and well-nourished. No distress.  Uncomfortable  HENT:  Head: Normocephalic and atraumatic.  Moist mucous membranes  Eyes: Conjunctivae are normal. Pupils are equal, round, and reactive to light.  Neck: Neck supple.  Cardiovascular: Normal rate, regular rhythm and normal heart sounds.   No murmur heard. Pulmonary/Chest: Effort normal and breath sounds normal.  Abdominal: Soft. Bowel sounds are normal. He exhibits no distension.  Exam limited by patient non-compliance, generalized TTP without peritonitis  Musculoskeletal: He exhibits no edema.  Neurological: He is alert and oriented to person, place, and time.  Fluent speech  Skin: Skin is warm and dry.  Psychiatric: He has a normal mood and affect. Judgment normal.  Nursing note and vitals reviewed.   ED Course  Procedures (including critical care time) Labs Review Labs Reviewed  BASIC METABOLIC PANEL - Abnormal; Notable for the following:    Glucose, Bld 104 (*)    All other components within  normal limits  URINALYSIS, ROUTINE W REFLEX MICROSCOPIC (NOT AT [facility]) - Abnormal; Notable for the following:    Hgb urine dipstick TRACE (*)    Ketones, ur 40 (*)    All other components within normal limits  URINE MICROSCOPIC-ADD ON - Abnormal; Notable for the following:    Squamous Epithelial / LPF 0-5 (*)    Bacteria, UA RARE (*)    All other components within normal limits  CBC WITH DIFFERENTIAL/PLATELET    Imaging  Review No results found. I have personally reviewed and evaluated these lab results as part of my medical decision-making.   EKG Interpretation None     Medications  oxyCODONE-acetaminophen (PERCOCET/ROXICET) 5-325 MG per tablet 1 tablet (1 tablet Oral Given 01/30/15 1505)  ondansetron (ZOFRAN-ODT) disintegrating tablet 4 mg (4 mg Oral Given 01/30/15 1429)    MDM   Final diagnoses:  Kidney stone  Non-intractable vomiting with nausea, vomiting of unspecified type   Pt w/ recent diagnosis of kidney stone who presents with ongoing pain and vomiting. Patient states that the symptoms are the same symptoms which brought him in last time. On exam, he was uncomfortable but in no acute distress. Vital signs unremarkable. Abdominal exam was limited because of patient noncompliance but he demonstrated generalized abdominal tenderness without peritonitis. His lab work is reassuring and shows normal creatinine, normal WBC count, and UA without any evidence of infection, containing 0-5 rbc's. Given that he is afebrile and has reassuring round work, I feel he is safe for discharge with follow-up tomorrow with urology. Gave Zofran and Percocet in the ED and provided with short course of pain medications as well as Flomax to use at home. On reexamination, the patient was asleep in bed and comfortable. Return precautions including fever or any other infectious symptoms reviewed. Patient voiced understanding and was discharged in satisfactory condition.   Laurence Spates, MD 01/30/15 [phone]

## 2015-01-30 NOTE — ED Notes (Signed)
Patient is violently vomiting with has about 500cc of emesis at this time.

## 2015-01-30 NOTE — ED Notes (Signed)
Patient is refusing po pain medications, stating he is able to request what he wants at [facility]. Patient made aware that there was no order for IV pain medications but the provider would be made aware of this request. Dr. Clarene Duke notified.

## 2015-01-31 ENCOUNTER — Telehealth: Payer: Self-pay | Admitting: *Deleted

## 2015-01-31 NOTE — Telephone Encounter (Signed)
Pharmacy called related to Rx: ondansetron (ZOFRAN ODT) 4 MG disintegrating tablet .Marland KitchenMarland KitchenEDCM clarified with EDP to change Rx to: ondansetron (ZOFRAN ODT) 4 MG  Tablet as pt insurance does not pay for disintegrating.

## 2015-04-01 ENCOUNTER — Emergency Department (HOSPITAL_COMMUNITY)
Admission: EM | Admit: 2015-04-01 | Discharge: 2015-04-01 | Disposition: A | Discharge status: Home / Self Care | Payer: Medicaid Other | Attending: Emergency Medicine | Admitting: Emergency Medicine

## 2015-04-01 ENCOUNTER — Encounter (HOSPITAL_COMMUNITY): Payer: Self-pay | Admitting: Emergency Medicine

## 2015-04-01 DIAGNOSIS — L299 Pruritus, unspecified: Secondary | ICD-10-CM

## 2015-04-01 DIAGNOSIS — Z79899 Other long term (current) drug therapy: Secondary | ICD-10-CM | POA: Diagnosis not present

## 2015-04-01 DIAGNOSIS — Z87442 Personal history of urinary calculi: Secondary | ICD-10-CM | POA: Diagnosis not present

## 2015-04-01 DIAGNOSIS — K649 Unspecified hemorrhoids: Secondary | ICD-10-CM | POA: Insufficient documentation

## 2015-04-01 DIAGNOSIS — F17200 Nicotine dependence, unspecified, uncomplicated: Secondary | ICD-10-CM | POA: Diagnosis not present

## 2015-04-01 DIAGNOSIS — K5900 Constipation, unspecified: Secondary | ICD-10-CM | POA: Insufficient documentation

## 2015-04-01 DIAGNOSIS — R21 Rash and other nonspecific skin eruption: Secondary | ICD-10-CM | POA: Diagnosis present

## 2015-04-01 DIAGNOSIS — L259 Unspecified contact dermatitis, unspecified cause: Secondary | ICD-10-CM | POA: Insufficient documentation

## 2015-04-01 MED ORDER — DIPHENHYDRAMINE HCL 25 MG PO TABS: 25.0000 mg | ORAL_TABLET | Freq: Four times a day (QID) | ORAL | Status: DC | PRN

## 2015-04-01 MED ORDER — HYDROXYZINE HCL 10 MG PO TABS: 10.0000 mg | ORAL_TABLET | Freq: Four times a day (QID) | ORAL | Status: DC | PRN

## 2015-04-01 MED ORDER — POLYETHYLENE GLYCOL 3350 17 GM/SCOOP PO POWD: 17.0000 g | Freq: Two times a day (BID) | ORAL | Status: DC

## 2015-04-01 NOTE — ED Notes (Signed)
Pt sts he has hemorrhoids, itchy rash and "2 bumps" on the back of his neck

## 2015-04-01 NOTE — Discharge Instructions (Signed)
Hemorrhoids °Hemorrhoids are swollen veins around the rectum or anus. There are two types of hemorrhoids:  °· Internal hemorrhoids. These occur in the veins just inside the rectum. They may poke through to the outside and become irritated and painful. °· External hemorrhoids. These occur in the veins outside the anus and can be felt as a painful swelling or hard lump near the anus. °CAUSES °· Pregnancy.   °· Obesity.   °· Constipation or diarrhea.   °· Straining to have a bowel movement.   °· Sitting for long periods on the toilet. °· Heavy lifting or other activity that caused you to strain. °· Anal intercourse. °SYMPTOMS  °· Pain.   °· Anal itching or irritation.   °· Rectal bleeding.   °· Fecal leakage.   °· Anal swelling.   °· One or more lumps around the anus.   °DIAGNOSIS  °Your caregiver may be able to diagnose hemorrhoids by visual examination. Other examinations or tests that may be performed include:  °· Examination of the rectal area with a gloved hand (digital rectal exam).   °· Examination of anal canal using a small tube (scope).   °· A blood test if you have lost a significant amount of blood. °· A test to look inside the colon (sigmoidoscopy or colonoscopy). °TREATMENT °Most hemorrhoids can be treated at home. However, if symptoms do not seem to be getting better or if you have a lot of rectal bleeding, your caregiver may perform a procedure to help make the hemorrhoids get smaller or remove them completely. Possible treatments include:  °· Placing a rubber band at the base of the hemorrhoid to cut off the circulation (rubber band ligation).   °· Injecting a chemical to shrink the hemorrhoid (sclerotherapy).   °· Using a tool to burn the hemorrhoid (infrared light therapy).   °· Surgically removing the hemorrhoid (hemorrhoidectomy).   °· Stapling the hemorrhoid to block blood flow to the tissue (hemorrhoid stapling).   °HOME CARE INSTRUCTIONS  °· Eat foods with fiber, such as whole grains, beans,  nuts, fruits, and vegetables. Ask your doctor about taking products with added fiber in them (fiber supplements). °· Increase fluid intake. Drink enough water and fluids to keep your urine clear or pale yellow.   °· Exercise regularly.   °· Go to the bathroom when you have the urge to have a bowel movement. Do not wait.   °· Avoid straining to have bowel movements.   °· Keep the anal area dry and clean. Use wet toilet paper or moist towelettes after a bowel movement.   °· Medicated creams and suppositories may be used or applied as directed.   °· Only take over-the-counter or prescription medicines as directed by your caregiver.   °· Take warm sitz baths for 15-20 minutes, 3-4 times a day to ease pain and discomfort.   °· Place ice packs on the hemorrhoids if they are tender and swollen. Using ice packs between sitz baths may be helpful.   °¨ Put ice in a plastic bag.   °¨ Place a towel between your skin and the bag.   °¨ Leave the ice on for 15-20 minutes, 3-4 times a day.   °· Do not use a donut-shaped pillow or sit on the toilet for long periods. This increases blood pooling and pain.   °SEEK MEDICAL CARE IF: °· You have increasing pain and swelling that is not controlled by treatment or medicine. °· You have uncontrolled bleeding. °· You have difficulty or you are unable to have a bowel movement. °· You have pain or inflammation outside the area of the hemorrhoids. °MAKE SURE YOU: °· Understand these instructions. °·   Will watch your condition.  Will get help right away if you are not doing well or get worse.   This information is not intended to replace advice given to you by your health care provider. Make sure you discuss any questions you have with your health care provider.   Document Released: 12/20/1999 Document Revised: 12/09/2011 Document Reviewed: 10/27/2011 Elsevier Interactive Patient Education 2016 [employer].  Allergies An allergy is an abnormal reaction to a substance by the body's  defense system (immune system). Allergies can develop at any age. WHAT CAUSES ALLERGIES? An allergic reaction happens when the immune system mistakenly reacts to a normally harmless substance, called an allergen, as if it were harmful. The immune system releases antibodies to fight the substance. Antibodies eventually release a chemical called histamine into the bloodstream. The release of histamine is meant to protect the body from infection, but it also causes discomfort. An allergic reaction can be triggered by:  Eating an allergen.  Inhaling an allergen.  Touching an allergen. WHAT TYPES OF ALLERGIES ARE THERE? There are many types of allergies. Common types include:  Seasonal allergies. People with this type of allergy are usually allergic to substances that are only present during certain seasons, such as molds and pollens.  Food allergies.  Drug allergies.  Insect allergies.  Animal dander allergies. WHAT ARE SYMPTOMS OF ALLERGIES? Possible allergy symptoms include:  Swelling of the lips, face, tongue, mouth, or throat.  Sneezing, coughing, or wheezing.  Nasal congestion.  Tingling in the mouth.  Rash.  Itching.  Itchy, red, swollen areas of skin (hives).  Watery eyes.  Vomiting.  Diarrhea.  Dizziness.  Lightheadedness.  Fainting.  Trouble breathing or swallowing.  Chest tightness.  Rapid heartbeat. HOW ARE ALLERGIES DIAGNOSED? Allergies are diagnosed with a medical and family history and one or more of the following:  Skin tests.  Blood tests.  A food diary. A food diary is a record of all the foods and drinks you have in a day and of all the symptoms you experience.  The results of an elimination diet. An elimination diet involves eliminating foods from your diet and then adding them back in one by one to find out if a certain food causes an allergic reaction. HOW ARE ALLERGIES TREATED? There is no cure for allergies, but allergic reactions  can be treated with medicine. Severe reactions usually need to be treated at a hospital. HOW CAN REACTIONS BE PREVENTED? The best way to prevent an allergic reaction is by avoiding the substance you are allergic to. Allergy shots and medicines can also help prevent reactions in some cases. People with severe allergic reactions may be able to prevent a life-threatening reaction called anaphylaxis with a medicine given right after exposure to the allergen.   This information is not intended to replace advice given to you by your health care provider. Make sure you discuss any questions you have with your health care provider.   Document Released: 03/17/2002 Document Revised: 01/12/2014 Document Reviewed: 10/03/2013 Elsevier Interactive Patient Education 2016 Elsevier Inc. Seborrheic Dermatitis Seborrheic dermatitis involves pink or red skin with greasy, flaky scales. It usually occurs on the scalp, and it is often called dandruff. This condition may also affect the eyebrows, nose, ears, chest, and the bearded area of men's faces. It often occurs where skin has more oil (sebaceous) glands. It may come and go for no known reason, and it is often long-lasting (chronic). CAUSES The cause is not known. RISK FACTORS This condition  is more like to develop in:  People who are stressed or tired.  People who have skin conditions, such as acne.  People who have certain conditions, such as:  HIV (human immunodeficiency virus).  AIDS (acquired immunodeficiency syndrome).  Parkinson disease.  An eating disorder.  Stroke.  Depression.  Epilepsy.  Alcoholism.  People who live in places that have extreme weather.  People who have a family history of seborrheic dermatitis.  People who use skin creams that are made with alcohol.  People who are 5330-472 years old.  People who take certain medicines. SYMPTOMS Symptoms of this condition include:  Thick scales on the scalp.  Redness on the  face or in the armpits.  Skin that is flaky. The flakes may be white or yellow.  Skin that seems oily or dry but is not helped with moisturizers.  Itching or burning in the affected areas. DIAGNOSIS This condition is diagnosed with a medical history and physical exam. A sample of your skin may be tested (skin biopsy). You may need to see a skin specialist (dermatologist). TREATMENT There is no cure for this condition, but treatment can help to manage the symptoms. Treatment may include:  Cortisone (steroid) ointments, creams, and lotions.  Over-the-counter or prescription shampoos. HOME CARE INSTRUCTIONS  Apply over-the-counter and prescription medicines only as told by your health care provider.  Keep all follow-up visits as told by your health care provider. This is important.  Try to reduce your stress, such as with yoga or mediation. If you need help to reduce stress, ask your health care provider.  Shower or bathe as told by your health care provider.  Use any medicated shampoos as told by your health care provider. SEEK MEDICAL CARE IF:  Your symptoms do not improve with treatment.  Your symptoms get worse.  You have new symptoms.   This information is not intended to replace advice given to you by your health care provider. Make sure you discuss any questions you have with your health care provider.   Document Released: 12/22/2004 Document Revised: 09/12/2014 Document Reviewed: 05/09/2014 Elsevier Interactive Patient Education [employer] Elsevier Inc.

## 2015-04-01 NOTE — ED Provider Notes (Signed)
CSN: [id number]     Arrival date & time 04/01/15  1217 History  By signing my name below, I, Tanda Rockers, attest that this documentation has been prepared under the direction and in the presence of [name], PA-C. Electronically Signed: Tanda Rockers, ED Scribe. 04/01/2015. 2:26 PM.   Chief Complaint  Patient presents with  . Rash  . Hemorrhoids   The history is provided by the patient. No language interpreter was used.     HPI Comments: Steven [name] is a 25 y.o. male who presents to the Emergency Department with multiple complaints. Pt is complaining of gradual onset, constant, itching sensation to diffuse body, worse on the scalp, x a couple of days. He does mention that he has been exposed to a new dog recently and is concerned that he could be allergic. Pt also complains of hemorrhoids with bright red stool and an itching, painful sensation to rectum x 2-3 weeks. He admits to straining while having bowel movements. Pt has been applying hemorrhoid cream without relief.  Pt mentions that he has a scaling rash to his penile area as well that he noticed about 1 week ago. He mentions using oil to the area prior to the rash appearing. Denies penile tenderness, testicular tenderness, penile discharge, or any other associated symptoms.    Past Medical History  Diagnosis Date  . Kidney stone   . Kidney stone    History reviewed. No pertinent past surgical history. History reviewed. No pertinent family history. Social History  Substance Use Topics  . Smoking status: Current Every Day Smoker    Types: Cigarettes  . Smokeless tobacco: None  . Alcohol Use: Yes     Comment: occasional    Review of Systems  Constitutional: Negative for fever and chills.  HENT: Negative for congestion and sore throat.   Eyes: Negative for visual disturbance.  Respiratory: Negative for cough, shortness of breath and wheezing.   Cardiovascular: Negative for chest pain and palpitations.   Gastrointestinal: Positive for constipation, blood in stool and rectal pain. Negative for nausea, vomiting, abdominal pain and diarrhea.       + Hemorrhoids  Genitourinary: Negative for dysuria, discharge, penile pain and testicular pain.  Musculoskeletal: Negative for back pain and neck pain.  Skin: Positive for rash (Penile and testicular rash).       + Itching to body  Neurological: Negative for headaches.   Allergies  Review of patient's allergies indicates no known allergies.  Home Medications   Prior to Admission medications   Medication Sig Start Date End Date Taking? Authorizing Provider  hydrOXYzine (ATARAX/VISTARIL) 10 MG tablet Take 1 tablet (10 mg total) by mouth every 6 (six) hours as needed for itching. 04/01/15   [name], PA-C  ondansetron (ZOFRAN ODT) 4 MG disintegrating tablet Take 1 tablet (4 mg total) by mouth every 8 (eight) hours as needed for nausea or vomiting. 01/30/15   Laurence Spates, MD  oxyCODONE-acetaminophen (PERCOCET) 5-325 MG tablet Take 1 tablet by mouth every 6 (six) hours as needed for severe pain. 01/30/15   Laurence Spates, MD  polyethylene glycol powder (GLYCOLAX/MIRALAX) powder Take 17 g by mouth 2 (two) times daily. Until daily soft stools  OTC 04/01/15   [name], PA-C  tamsulosin (FLOMAX) 0.4 MG CAPS capsule Take 1 capsule (0.4 mg total) by mouth daily. 01/30/15   [name], MD   BP 129/81 mmHg  Pulse 83  Temp(Src) 98.7 F (37.1 C) (Oral)  Resp 18  SpO2 100%   Physical Exam  Constitutional: He appears well-developed and well-nourished. No distress.  Nontoxic appearing.  HENT:  Head: Normocephalic and atraumatic.  Mouth/Throat: Oropharynx is clear and moist.  Eyes: Conjunctivae are normal. Pupils are equal, round, and reactive to light. Right eye exhibits no discharge. Left eye exhibits no discharge.  Neck: Neck supple.  2 bumps on back of neck.   Cardiovascular: Normal rate, regular rhythm, normal heart  sounds and intact distal pulses.   Pulmonary/Chest: Effort normal and breath sounds normal. No respiratory distress. He has no wheezes. He has no rales.  Abdominal: Soft. There is no tenderness.  Genitourinary: Penis normal. Rectal exam shows no external hemorrhoid. No penile tenderness.  Chaperone present.  Dry slightly erythematous rash noted to penis and testicles that appears to be contact dermatitis.  Dry red skin to rectum. No external hemorrhoids. No thrombosed hemorrhoids. No fissures.   Lymphadenopathy:    He has no cervical adenopathy.  Neurological: He is alert. Coordination normal.  Skin: Skin is warm and dry. Rash noted. He is not diaphoretic.  Dry scaly skin noted to front of scalp. No vesicles or bulla.   Psychiatric: He has a normal mood and affect. His behavior is normal.  Nursing note and vitals reviewed.   ED Course  Procedures (including critical care time)  DIAGNOSTIC STUDIES: Oxygen Saturation is 100% on RA, normal by my interpretation.    COORDINATION OF CARE: 2:23 PM-Discussed treatment plan which includes Benadryl, dandruff shampoo, and following up with PCP with pt at bedside and pt agreed to plan.    MDM   Meds given in ED:  Medications - No data to display  New Prescriptions   HYDROXYZINE (ATARAX/VISTARIL) 10 MG TABLET    Take 1 tablet (10 mg total) by mouth every 6 (six) hours as needed for itching.   POLYETHYLENE GLYCOL POWDER (GLYCOLAX/MIRALAX) POWDER    Take 17 g by mouth 2 (two) times daily. Until daily soft stools  OTC    Final diagnoses:  Constipation, unspecified constipation type  Itchy scalp  Contact dermatitis    This is a 25 y.o. male who presents to the Emergency Department with multiple complaints. Pt is complaining of gradual onset, constant, itching sensation to diffuse body, worse on the scalp, x a couple of days. He does mention that he has been exposed to a new dog recently and is concerned that he could be allergic. Pt also  complains of hemorrhoids with bright red stool and an itching, painful sensation to rectum x 2-3 weeks. He admits to straining while having bowel movements. Pt has been applying hemorrhoid cream without relief.  Pt mentions that he has a scaling rash to his penile area as well that he noticed about 1 week ago. He mentions using oil to the area prior to the rash appearing. On exam the patient is afebrile nontoxic appearing. His abdomen is soft nontender to palpation. He has some dry scaly skin to the front of his scalp. Suspect some dandruff. No vesicles or other lesions. Patient has some dry erythematous patches to his penis and testicles. This appeared after he used a lotion on his penis and testicles while masturbating. I suspected contact dermatitis. I advised to stop using this lotion. No other lesions noted. Patient has no external hemorrhoids. He has some dry red skin around his rectum. Will provide with MiraLAX for his constipation I encouraged him to follow with health department for STD screening. I advised the patient to follow-up  with their primary care provider this week. I advised the patient to return to the emergency department with new or worsening symptoms or new concerns. The patient verbalized understanding and agreement with plan.    I personally performed the services described in this documentation, which was scribed in my presence. The recorded information has been reviewed and is accurate.          [name], PA-C 04/01/15 1438  Arby Barrette, MD 04/03/15 [phone]

## 2015-04-01 NOTE — ED Notes (Signed)
Declined W/C at D/C and was escorted to lobby by RN. 

## 2015-06-19 ENCOUNTER — Emergency Department (HOSPITAL_COMMUNITY)
Admission: EM | Admit: 2015-06-19 | Discharge: 2015-06-19 | Disposition: A | Discharge status: Home / Self Care | Payer: Medicaid Other | Attending: Emergency Medicine | Admitting: Emergency Medicine

## 2015-06-19 ENCOUNTER — Encounter (HOSPITAL_COMMUNITY): Payer: Self-pay

## 2015-06-19 DIAGNOSIS — Z79899 Other long term (current) drug therapy: Secondary | ICD-10-CM | POA: Diagnosis not present

## 2015-06-19 DIAGNOSIS — Z791 Long term (current) use of non-steroidal anti-inflammatories (NSAID): Secondary | ICD-10-CM | POA: Diagnosis not present

## 2015-06-19 DIAGNOSIS — J020 Streptococcal pharyngitis: Secondary | ICD-10-CM | POA: Diagnosis not present

## 2015-06-19 DIAGNOSIS — R0981 Nasal congestion: Secondary | ICD-10-CM | POA: Diagnosis present

## 2015-06-19 DIAGNOSIS — F17210 Nicotine dependence, cigarettes, uncomplicated: Secondary | ICD-10-CM | POA: Insufficient documentation

## 2015-06-19 LAB — RAPID STREP SCREEN (MED CTR MEBANE ONLY): Streptococcus, Group A Screen (Direct): POSITIVE — AB

## 2015-06-19 MED ORDER — PENICILLIN G BENZATHINE 1200000 UNIT/2ML IM SUSP
1.2000 10*6.[IU] | Freq: Once | INTRAMUSCULAR | Status: AC
  Administered 2015-06-19: 1.2 10*6.[IU] via INTRAMUSCULAR
  Filled 2015-06-19: qty 2

## 2015-06-19 MED ORDER — IBUPROFEN 800 MG PO TABS
800.0000 mg | ORAL_TABLET | Freq: Once | ORAL | Status: AC
  Administered 2015-06-19: 800 mg via ORAL
  Filled 2015-06-19: qty 1

## 2015-06-19 NOTE — ED Provider Notes (Signed)
CSN: [id number]     Arrival date & time 06/19/15  0005 History  By signing my name below, I, Steven [name], attest that this documentation has been prepared under the direction and in the presence of [name], MD. Electronically Signed: Angelene GiovanniEmmanuella [name], ED Scribe. 06/19/2015. 2:56 AM.     Chief Complaint  Patient presents with  . URI   Patient is a 25 y.o. male presenting with URI. The history is provided by the patient. No language interpreter was used.  URI Presenting symptoms: congestion and sore throat   Presenting symptoms: no cough and no fever   Severity:  Moderate Onset quality:  Gradual Duration:  2 days Timing:  Constant Progression:  Worsening Chronicity:  New Relieved by:  Nothing Worsened by:  Nothing tried Ineffective treatments:  OTC medications Associated symptoms: no arthralgias and no wheezing   Risk factors: not elderly and no sick contacts    HPI Comments: Steven [name] is a 25 y.o. male who presents to the Emergency Department complaining of gradually worsening URI symptoms onset 2 days ago. He reports associated sore throat, congestion, and chest wall pain. He denies any sick contacts. He states that he has tried motrin with no relief. No other alleviating factors noted. No cough, wheezing, fever, chills, or SOB.   Past Medical History  Diagnosis Date  . Kidney stone   . Kidney stone    History reviewed. No pertinent past surgical history. History reviewed. No pertinent family history. Social History  Substance Use Topics  . Smoking status: Current Every Day Smoker    Types: Cigarettes  . Smokeless tobacco: None  . Alcohol Use: Yes     Comment: occasional    Review of Systems  Constitutional: Negative for fever and chills.  HENT: Positive for congestion and sore throat. Negative for trouble swallowing and voice change.   Respiratory: Negative for cough, shortness of breath and wheezing.   Musculoskeletal: Negative for arthralgias.  All  other systems reviewed and are negative.     Allergies  Review of patient's allergies indicates no known allergies.  Home Medications   Prior to Admission medications   Medication Sig Start Date End Date Taking? Authorizing Provider  ibuprofen (ADVIL,MOTRIN) 200 MG tablet Take 600 mg by mouth every 6 (six) hours as needed for moderate pain.   Yes Historical Provider, MD  diphenhydrAMINE (BENADRYL) 25 MG tablet Take 1 tablet (25 mg total) by mouth every 6 (six) hours as needed for itching (Rash). Patient not taking: Reported on 06/19/2015 04/01/15   Everlene FarrierWilliam Dansie, PA-C  hydrOXYzine (ATARAX/VISTARIL) 10 MG tablet Take 1 tablet (10 mg total) by mouth every 6 (six) hours as needed for itching. Patient not taking: Reported on 06/19/2015 04/01/15   Everlene FarrierWilliam Dansie, PA-C  ondansetron (ZOFRAN ODT) 4 MG disintegrating tablet Take 1 tablet (4 mg total) by mouth every 8 (eight) hours as needed for nausea or vomiting. Patient not taking: Reported on 06/19/2015 01/30/15   [name], MD  oxyCODONE-acetaminophen (PERCOCET) 5-325 MG tablet Take 1 tablet by mouth every 6 (six) hours as needed for severe pain. Patient not taking: Reported on 06/19/2015 01/30/15   [name], MD  polyethylene glycol powder (GLYCOLAX/MIRALAX) powder Take 17 g by mouth 2 (two) times daily. Until daily soft stools  OTC Patient not taking: Reported on 06/19/2015 04/01/15   Everlene FarrierWilliam Dansie, PA-C  tamsulosin (FLOMAX) 0.4 MG CAPS capsule Take 1 capsule (0.4 mg total) by mouth daily. Patient not taking: Reported on 06/19/2015 01/30/15  [name], MD   BP 111/82 mmHg  Pulse 88  Temp(Src) 98.9 F (37.2 C) (Oral)  Resp 20  Ht  (1.803 m)  Wt 236 lb (107.049 kg)  BMI 32.93 kg/m2  SpO2 98% Physical Exam  Constitutional: He is oriented to person, place, and time. He appears well-developed and well-nourished.  HENT:  Head: Normocephalic and atraumatic.  Mouth/Throat: Oropharynx is clear and moist. No  oropharyngeal exudate.  Eyes: Pupils are equal, round, and reactive to light.  Neck: Normal range of motion. Neck supple. No tracheal deviation present.  No bruits intact phonation  Cardiovascular: Normal rate and regular rhythm.   Pulmonary/Chest: Effort normal and breath sounds normal. No stridor. He has no wheezes. He has no rales.  Lungs clear  Abdominal: Soft. Bowel sounds are normal. There is no tenderness. There is no rebound and no guarding.  Musculoskeletal: Normal range of motion.  Neurological: He is alert and oriented to person, place, and time.  Skin: Skin is warm and dry.  Psychiatric: He has a normal mood and affect.  Nursing note and vitals reviewed.   ED Course  Procedures (including critical care time) DIAGNOSTIC STUDIES: Oxygen Saturation is 98% on RA, normal by my interpretation.    COORDINATION OF CARE: 2:44 AM- Pt advised of plan for treatment and pt agrees. Pt will receive rapid strep screen for further evaluation.     Labs Review Labs Reviewed - No data to display  Imaging Review No results found.   [name], MD has personally reviewed and evaluated these images and lab results as part of her medical decision-making.   EKG Interpretation None      MDM   Final diagnoses:  None   Filed Vitals:   06/19/15 0011  BP: 111/82  Pulse: 88  Temp: 98.9 F (37.2 C)  Resp: 20   Results for orders placed or performed during the hospital encounter of 06/19/15  Rapid strep screen  Result Value Ref Range   Streptococcus, Group A Screen (Direct) POSITIVE (A) NEGATIVE   No results found.  Medications  penicillin g benzathine (BICILLIN LA) 1200000 UNIT/2ML injection 1.2 Million Units (not administered)    Tylenol and motrin at home for pain.  Follow up with your PMD strict return precautions given  I personally performed the services described in this documentation, which was scribed in my presence. The recorded information has been reviewed and  is accurate.     Cy Blamer, MD 06/19/15 [id number]

## 2015-06-19 NOTE — ED Notes (Signed)
Pt complains of flu like sx for two days

## 2015-06-19 NOTE — ED Notes (Signed)
Pt is waiting on his mom to get here to pick him up

## 2015-06-19 NOTE — Discharge Instructions (Signed)
Rapid Strep Test Strep throat is a bacterial infection caused by the bacteria Streptococcus pyogenes. A rapid strep test is the quickest way to check if these bacteria are causing your sore throat. The test can be done at your health care provider's office. Results are usually ready in 10-20 minutes. You may have this test if you have symptoms of strep throat. These include:   A red throat with yellow or white spots.  Neck swelling and tenderness.  Fever.  Loss of appetite.  Trouble breathing or swallowing.  Rash.  Dehydration. This test requires a sample of fluid from the back of your throat and tonsils. Your health care provider may hold down your tongue with a tongue depressor and use a swab to collect the sample.  Your health care provider may collect a second sample at the same time. The second sample may be used for a throat culture. In a culture test, the sample is combined with a substance that encourages bacteria to grow. It takes longer to get the results of the throat culture test, but they are more accurate. They can confirm the results from a rapid strep test, or show that those results were wrong. RESULTS  It is your responsibility to obtain your test results. Ask the lab or department performing the test when and how you will get your results. Contact your health care provider to discuss any questions you have about your results.  The results of the rapid strep test will be negative or positive.  Meaning of Negative Test Results If the result of your rapid strep test is negative, then it means:   It is likely that you do not have strep throat.  A virus may be causing your sore throat. Your health care provider may do a throat culture to confirm the results of the rapid strep test. The throat culture can also identify the different strains of strep bacteria. Meaning of Positive Test Results If the result of your rapid strep test is positive, then it means:  It is likely  that you do have strep throat.  You may have to take antibiotics. Your health care provider may do a throat culture to confirm the results of the rapid strep test. Strep throat usually requires a course of antibiotics.    This information is not intended to replace advice given to you by your health care provider. Make sure you discuss any questions you have with your health care provider.   Document Released: 01/30/2004 Document Revised: 01/12/2014 Document Reviewed: 03/30/2013 Elsevier Interactive Patient Education 2016 [employer]  

## 2015-09-20 ENCOUNTER — Emergency Department (HOSPITAL_COMMUNITY)
Admission: EM | Admit: 2015-09-20 | Discharge: 2015-09-20 | Disposition: A | Discharge status: Home / Self Care | Payer: Medicaid Other | Attending: Emergency Medicine | Admitting: Emergency Medicine

## 2015-09-20 ENCOUNTER — Encounter (HOSPITAL_COMMUNITY): Payer: Self-pay

## 2015-09-20 ENCOUNTER — Emergency Department (HOSPITAL_COMMUNITY): Payer: Medicaid Other

## 2015-09-20 DIAGNOSIS — R059 Cough, unspecified: Secondary | ICD-10-CM

## 2015-09-20 DIAGNOSIS — R05 Cough: Secondary | ICD-10-CM | POA: Diagnosis present

## 2015-09-20 DIAGNOSIS — F17210 Nicotine dependence, cigarettes, uncomplicated: Secondary | ICD-10-CM | POA: Insufficient documentation

## 2015-09-20 DIAGNOSIS — J020 Streptococcal pharyngitis: Secondary | ICD-10-CM | POA: Diagnosis not present

## 2015-09-20 LAB — RAPID STREP SCREEN (MED CTR MEBANE ONLY): Streptococcus, Group A Screen (Direct): POSITIVE — AB

## 2015-09-20 MED ORDER — PENICILLIN G BENZATHINE 1200000 UNIT/2ML IM SUSP
1.2000 10*6.[IU] | Freq: Once | INTRAMUSCULAR | Status: AC
  Administered 2015-09-20: 1.2 10*6.[IU] via INTRAMUSCULAR
  Filled 2015-09-20: qty 2

## 2015-09-20 MED ORDER — BENZONATATE 100 MG PO CAPS: 100.0000 mg | ORAL_CAPSULE | Freq: Three times a day (TID) | ORAL | 0 refills | Status: DC

## 2015-09-20 NOTE — ED Notes (Signed)
Pt departed in NAD.  

## 2015-09-20 NOTE — Discharge Instructions (Signed)
Please read and follow all provided instructions.  Your diagnoses today include:  1. Streptococcal pharyngitis   2. Cough     Tests performed today include:  Strep test: was POSITIVE for strep throat  Vital signs. See below for your results today.   Medications prescribed:  You were given a one-time shot of penicillin to treat your strep throat.    Tessalon Perles - cough suppressant medication  Take any medications prescribed only as directed.   Home care instructions:  Please read the educational materials provided and follow any instructions contained in this packet.  Follow-up instructions: Please follow-up with your primary care provider as needed for further evaluation of your symptoms.  Return instructions:   Please return to the Emergency Department if you experience worsening symptoms.   Return if you have worsening problems swallowing, your neck becomes swollen, you cannot swallow your saliva or your voice becomes muffled.   Return with high persistent fever, persistent vomiting, or if you have trouble breathing.   Please return if you have any other emergent concerns.  Additional Information:  Your vital signs today were: BP 107/73 (BP Location: Right Arm)    Pulse 71    Temp 98.3 F (36.8 C) (Oral)    Resp 18    Ht 5\' 11"  (1.803 m)    Wt 108.9 kg    SpO2 97%    BMI 33.47 kg/m  If your blood pressure (BP) was elevated above 135/85 this visit, please have this repeated by your doctor within one month. --------------

## 2015-09-20 NOTE — ED Provider Notes (Signed)
[facility]-EMERGENCY DEPT Provider Note   CSN: [id number] Arrival date & time: 09/20/15  1727     History   Chief Complaint Chief Complaint  Patient presents with  . Cough  . Chills  . Headache    HPI Steven [name] is a 25 y.o. male.  Patient presents with complaint of 10 days of nasal congestion, cough, sore throat, chills and subjective fevers. Cough is productive of yellow sputum at times. Over-the-counter medications have not been helping. No known sick contacts. No chest pain or shortness of breath. No abdominal pain, nausea, vomiting, or diarrhea. No urinary symptoms. Patient has had a mild headache. No neck pain or stiffness. No confusion. Onset of symptoms acute. Course is constant. Nothing makes symptoms better or worse.      Past Medical History:  Diagnosis Date  . Kidney stone   . Kidney stone     There are no active problems to display for this patient.   History reviewed. No pertinent surgical history.     Home Medications    Prior to Admission medications   Medication Sig Start Date End Date Taking? Authorizing Provider  ibuprofen (ADVIL,MOTRIN) 200 MG tablet Take 600 mg by mouth every 6 (six) hours as needed for moderate pain.   Yes Historical Provider, MD    Family History History reviewed. No pertinent family history.  Social History Social History  Substance Use Topics  . Smoking status: Current Some Day Smoker    Types: Cigarettes  . Smokeless tobacco: Never Used  . Alcohol use Yes     Comment: occasional     Allergies   Review of patient's allergies indicates no known allergies.   Review of Systems Review of Systems  Constitutional: Positive for chills and fever. Negative for fatigue.  HENT: Positive for congestion, rhinorrhea and sore throat. Negative for ear pain and sinus pressure.   Eyes: Negative for redness.  Respiratory: Positive for cough. Negative for wheezing.   Cardiovascular: Negative for chest pain.    Gastrointestinal: Negative for abdominal pain, diarrhea, nausea and vomiting.  Genitourinary: Negative for dysuria.  Musculoskeletal: Negative for myalgias and neck stiffness.  Skin: Negative for rash.  Neurological: Positive for headaches.  Hematological: Negative for adenopathy.     Physical Exam Updated Vital Signs BP 107/73 (BP Location: Right Arm)   Pulse 71   Temp 98.3 F (36.8 C) (Oral)   Resp 18   Ht 5\' 11"  (1.803 m)   Wt 108.9 kg   SpO2 97%   BMI 33.47 kg/m   Physical Exam  Constitutional: He appears well-developed and well-nourished.  HENT:  Head: Normocephalic and atraumatic.  Right Ear: Tympanic membrane, external ear and ear canal normal.  Left Ear: Tympanic membrane, external ear and ear canal normal.  Nose: Mucosal edema present. No rhinorrhea.  Mouth/Throat: Uvula is midline and mucous membranes are normal. Mucous membranes are not dry. No trismus in the jaw. No uvula swelling. Posterior oropharyngeal erythema present. No oropharyngeal exudate, posterior oropharyngeal edema or tonsillar abscesses.  Eyes: Conjunctivae are normal. Right eye exhibits no discharge. Left eye exhibits no discharge.  Neck: Normal range of motion. Neck supple.  Cardiovascular: Normal rate, regular rhythm and normal heart sounds.   Pulmonary/Chest: Effort normal and breath sounds normal. No respiratory distress. He has no wheezes. He has no rales.  Abdominal: Soft. There is no tenderness.  Neurological: He is alert.  Skin: Skin is warm and dry.  Psychiatric: He has a normal mood and affect.  Nursing note  and vitals reviewed.    ED Treatments / Results  Labs (all labs ordered are listed, but only abnormal results are displayed) Labs Reviewed  RAPID STREP SCREEN (NOT AT [facility]) - Abnormal; Notable for the following:       Result Value   Streptococcus, Group A Screen (Direct) POSITIVE (*)    All other components within normal limits     Radiology Dg Chest 2 View  Result  Date: 09/20/2015 CLINICAL DATA:  Patient with productive cough. EXAM: CHEST  2 VIEW COMPARISON:  Chest radiograph 04/30/2006 FINDINGS: The heart size and mediastinal contours are within normal limits. Both lungs are clear. The visualized skeletal structures are unremarkable. IMPRESSION: No active cardiopulmonary disease. Electronically Signed   By: Annia Beltrew  Davis M.D.   On: 09/20/2015 18:42    Procedures Procedures (including critical care time)  Medications Ordered in ED Medications  penicillin g benzathine (BICILLIN LA) 1200000 UNIT/2ML injection 1.2 Million Units (not administered)     Initial Impression / Assessment and Plan / ED Course  I have reviewed the triage vital signs and the nursing notes.  Pertinent labs & imaging results that were available during my care of the patient were reviewed by me and considered in my medical decision making (see chart for details).  Clinical Course   Patient seen and examined. Patient informed of results. Will treat possible streptococcal pharyngitis with intramuscular Bicillin. Will give prescription for Tessalon for home.   Vital signs reviewed and are as follows: BP 107/73 (BP Location: Right Arm)   Pulse 71   Temp 98.3 F (36.8 C) (Oral)   Resp 18   Ht 5\' 11"  (1.803 m)   Wt 108.9 kg   SpO2 97%   BMI 33.47 kg/m   Patient counseled on supportive care for viral URI and s/s to return including worsening symptoms, persistent fever, persistent vomiting, or if they have any other concerns. Urged to see PCP if symptoms persist for more than 3 days. Patient verbalizes understanding and agrees with plan.    Final Clinical Impressions(s) / ED Diagnoses   Final diagnoses:  Streptococcal pharyngitis  Cough   Patient with constellation of symptoms suggestive of viral syndrome, however he does have a positive strep test here. Several positive strep test over the past several years. It is possible that he may be colonized, however with sore throat  will treat with penicillin. Otherwise, symptomatic treatment. Return instructions as above. Patient appears well, nontoxic. Discussed good hygiene as patient is around a family member who is pregnant.   New Prescriptions New Prescriptions   No medications on file     Renne CriglerJoshua Martisha Toulouse, Cordelia Poche-C 09/20/15 2218    Zadie Rhineonald Wickline, MD 09/20/15 2330

## 2015-09-20 NOTE — ED Triage Notes (Signed)
Pt here with c/o of generalized body aches, headache and chills X10 days. Pt also reports headache. No fever in triage. Skin warm and dry.

## 2015-10-14 ENCOUNTER — Emergency Department (HOSPITAL_COMMUNITY)
Admission: EM | Admit: 2015-10-14 | Discharge: 2015-10-14 | Disposition: A | Discharge status: Home / Self Care | Payer: Medicaid Other | Attending: Emergency Medicine | Admitting: Emergency Medicine

## 2015-10-14 ENCOUNTER — Encounter (HOSPITAL_COMMUNITY): Payer: Self-pay

## 2015-10-14 ENCOUNTER — Emergency Department (HOSPITAL_COMMUNITY)
Admission: EM | Admit: 2015-10-14 | Discharge: 2015-10-14 | Discharge status: Against Medical Advice | Payer: Medicaid Other

## 2015-10-14 DIAGNOSIS — F17210 Nicotine dependence, cigarettes, uncomplicated: Secondary | ICD-10-CM | POA: Insufficient documentation

## 2015-10-14 DIAGNOSIS — M791 Myalgia: Secondary | ICD-10-CM | POA: Insufficient documentation

## 2015-10-14 DIAGNOSIS — Z5321 Procedure and treatment not carried out due to patient leaving prior to being seen by health care provider: Secondary | ICD-10-CM | POA: Insufficient documentation

## 2015-10-14 LAB — URINALYSIS, ROUTINE W REFLEX MICROSCOPIC
Bilirubin Urine: NEGATIVE
Glucose, UA: NEGATIVE mg/dL
Hgb urine dipstick: NEGATIVE
Ketones, ur: 15 mg/dL — AB
Leukocytes, UA: NEGATIVE
Nitrite: NEGATIVE
Protein, ur: NEGATIVE mg/dL
Specific Gravity, Urine: 1.031 — ABNORMAL HIGH (ref 1.005–1.030)
pH: 6 (ref 5.0–8.0)

## 2015-10-14 LAB — CBC
HCT: 45.5 % (ref 39.0–52.0)
Hemoglobin: 15.5 g/dL (ref 13.0–17.0)
MCH: 29.2 pg (ref 26.0–34.0)
MCHC: 34.1 g/dL (ref 30.0–36.0)
MCV: 85.7 fL (ref 78.0–100.0)
Platelets: 125 10*3/uL — ABNORMAL LOW (ref 150–400)
RBC: 5.31 MIL/uL (ref 4.22–5.81)
RDW: 12.6 % (ref 11.5–15.5)
WBC: 3.7 10*3/uL — ABNORMAL LOW (ref 4.0–10.5)

## 2015-10-14 LAB — COMPREHENSIVE METABOLIC PANEL
ALT: 30 U/L (ref 17–63)
AST: 27 U/L (ref 15–41)
Albumin: 4.4 g/dL (ref 3.5–5.0)
Alkaline Phosphatase: 51 U/L (ref 38–126)
Anion gap: 8 (ref 5–15)
BUN: 7 mg/dL (ref 6–20)
CO2: 27 mmol/L (ref 22–32)
Calcium: 9.1 mg/dL (ref 8.9–10.3)
Chloride: 100 mmol/L — ABNORMAL LOW (ref 101–111)
Creatinine, Ser: 1.15 mg/dL (ref 0.61–1.24)
GFR calc Af Amer: >60 mL/min (ref >60)
GFR calc non Af Amer: >60 mL/min (ref >60)
Glucose, Bld: 83 mg/dL (ref 65–99)
Potassium: 3.9 mmol/L (ref 3.5–5.1)
Sodium: 135 mmol/L (ref 135–145)
Total Bilirubin: 0.3 mg/dL (ref 0.3–1.2)
Total Protein: 6.8 g/dL (ref 6.5–8.1)

## 2015-10-14 LAB — LIPASE, BLOOD: Lipase: 32 U/L (ref 11–51)

## 2015-10-14 NOTE — ED Triage Notes (Signed)
Pt complaining of body aches since this am. Pt states 4 episodes of diarrhea today. Pt denies any nausea or vomiting.

## 2015-10-14 NOTE — ED Notes (Signed)
Pt just walked outside in a hurry on cell phone

## 2015-10-14 NOTE — ED Notes (Signed)
[facility] pt [facility], pt very understanding. Nurse first also aware of move.

## 2015-10-14 NOTE — ED Notes (Signed)
Called for pt in lobby for rooming, no response.

## 2015-10-14 NOTE — ED Notes (Signed)
Pt called for triage. No response.  

## 2015-10-14 NOTE — ED Notes (Signed)
Called for pt again for reassess, no response.

## 2015-10-14 NOTE — ED Notes (Signed)
Calleed patient to triage. Unable to locate patient at this time..Marland Kitchen

## 2015-10-16 ENCOUNTER — Encounter (HOSPITAL_COMMUNITY): Payer: Self-pay | Admitting: *Deleted

## 2015-10-16 ENCOUNTER — Emergency Department (HOSPITAL_COMMUNITY): Payer: Medicaid Other

## 2015-10-16 ENCOUNTER — Emergency Department (HOSPITAL_COMMUNITY)
Admission: EM | Admit: 2015-10-16 | Discharge: 2015-10-16 | Disposition: A | Discharge status: Home / Self Care | Payer: Medicaid Other | Attending: Emergency Medicine | Admitting: Emergency Medicine

## 2015-10-16 DIAGNOSIS — K529 Noninfective gastroenteritis and colitis, unspecified: Secondary | ICD-10-CM | POA: Diagnosis not present

## 2015-10-16 DIAGNOSIS — F17210 Nicotine dependence, cigarettes, uncomplicated: Secondary | ICD-10-CM | POA: Insufficient documentation

## 2015-10-16 DIAGNOSIS — R197 Diarrhea, unspecified: Secondary | ICD-10-CM | POA: Diagnosis present

## 2015-10-16 LAB — COMPREHENSIVE METABOLIC PANEL
ALT: 26 U/L (ref 17–63)
AST: 28 U/L (ref 15–41)
Albumin: 4.1 g/dL (ref 3.5–5.0)
Alkaline Phosphatase: 46 U/L (ref 38–126)
Anion gap: 8 (ref 5–15)
BUN: 7 mg/dL (ref 6–20)
CO2: 22 mmol/L (ref 22–32)
Calcium: 8.8 mg/dL — ABNORMAL LOW (ref 8.9–10.3)
Chloride: 105 mmol/L (ref 101–111)
Creatinine, Ser: 0.87 mg/dL (ref 0.61–1.24)
GFR calc Af Amer: >60 mL/min (ref >60)
GFR calc non Af Amer: >60 mL/min (ref >60)
Glucose, Bld: 92 mg/dL (ref 65–99)
Potassium: 4 mmol/L (ref 3.5–5.1)
Sodium: 135 mmol/L (ref 135–145)
Total Bilirubin: 0.3 mg/dL (ref 0.3–1.2)
Total Protein: 7 g/dL (ref 6.5–8.1)

## 2015-10-16 LAB — URINALYSIS, ROUTINE W REFLEX MICROSCOPIC
Bilirubin Urine: NEGATIVE
Glucose, UA: NEGATIVE mg/dL
Ketones, ur: NEGATIVE mg/dL
Leukocytes, UA: NEGATIVE
Nitrite: NEGATIVE
Protein, ur: NEGATIVE mg/dL
Specific Gravity, Urine: 1.029 (ref 1.005–1.030)
pH: 6 (ref 5.0–8.0)

## 2015-10-16 LAB — CBC
HCT: 42.9 % (ref 39.0–52.0)
Hemoglobin: 15.1 g/dL (ref 13.0–17.0)
MCH: 29 pg (ref 26.0–34.0)
MCHC: 35.2 g/dL (ref 30.0–36.0)
MCV: 82.5 fL (ref 78.0–100.0)
Platelets: 105 10*3/uL — ABNORMAL LOW (ref 150–400)
RBC: 5.2 MIL/uL (ref 4.22–5.81)
RDW: 12.9 % (ref 11.5–15.5)
WBC: 3.3 10*3/uL — ABNORMAL LOW (ref 4.0–10.5)

## 2015-10-16 LAB — URINE MICROSCOPIC-ADD ON

## 2015-10-16 LAB — RAPID HIV SCREEN (HIV 1/2 AB+AG)
HIV 1/2 Antibodies: NONREACTIVE
HIV-1 P24 Antigen - HIV24: NONREACTIVE

## 2015-10-16 LAB — LIPASE, BLOOD: Lipase: 28 U/L (ref 11–51)

## 2015-10-16 MED ORDER — HYDROCODONE-ACETAMINOPHEN 5-325 MG PO TABS
1.0000 | ORAL_TABLET | Freq: Once | ORAL | Status: AC
  Administered 2015-10-16: 1 via ORAL
  Filled 2015-10-16: qty 1

## 2015-10-16 MED ORDER — SODIUM CHLORIDE 0.9 % IV SOLN
INTRAVENOUS | Status: DC
  Administered 2015-10-16: 17:00:00 via INTRAVENOUS

## 2015-10-16 MED ORDER — ONDANSETRON HCL 4 MG PO TABS: 4.0000 mg | ORAL_TABLET | Freq: Four times a day (QID) | ORAL | 0 refills | Status: DC

## 2015-10-16 MED ORDER — ONDANSETRON HCL 4 MG/2ML IJ SOLN
4.0000 mg | Freq: Once | INTRAMUSCULAR | Status: AC
  Administered 2015-10-16: 4 mg via INTRAVENOUS
  Filled 2015-10-16: qty 2

## 2015-10-16 MED ORDER — DEXTROSE 5 % IN LACTATED RINGERS IV BOLUS
500.0000 mL | Freq: Once | INTRAVENOUS | Status: AC
  Administered 2015-10-16: 500 mL via INTRAVENOUS

## 2015-10-16 NOTE — ED Notes (Signed)
Pt up to bathroom. C/o aches in muscles in leg.

## 2015-10-16 NOTE — Discharge Instructions (Signed)
Rest and be sure you are drinking plenty of fluids. Return as needed for worsening symptoms.

## 2015-10-16 NOTE — ED Provider Notes (Signed)
[facility] Provider Note   CSN: [id number] Arrival date & time: 10/16/15  1337     History   Chief Complaint Chief Complaint  Patient presents with  . Fever  . Diarrhea    HPI Steven [name] is a 25 y.o. male who presents to the ED with diarrhea that started 2 days ago. He has lower abdominal cramping. The pain is worse when the diarrhea comes. Patient reports having at least 3 watery yellow/green stools per day. Patient has not taken his temperature at home. Patient also reports coughing up "black stuff" for the past month. Patient complains of night sweats. He denies weight loss. He has a new male sex partner x 2 months. 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 He reports at that time he had "spots all over his body".   HPI  Past Medical History:  Diagnosis Date  . Kidney stone   . Kidney stone     There are no active problems to display for this patient.   History reviewed. No pertinent surgical history.     Home Medications    Prior to Admission medications   Medication Sig Start Date End Date Taking? Authorizing Provider  benzonatate (TESSALON) 100 MG capsule Take 1 capsule (100 mg total) by mouth every 8 (eight) hours. 09/20/15   Renne Crigler, PA-C  ibuprofen (ADVIL,MOTRIN) 200 MG tablet Take 600 mg by mouth every 6 (six) hours as needed for moderate pain.    Historical Provider, MD  ondansetron (ZOFRAN) 4 MG tablet Take 1 tablet (4 mg total) by mouth every 6 (six) hours. 10/16/15   Filbert Craze Orlene Och, NP    Family History History reviewed. No pertinent family history.  Social History Social History  Substance Use Topics  . Smoking status: Current Some Day Smoker    Types: Cigarettes  . Smokeless tobacco: Never Used  . Alcohol use Yes     Comment: occasional     Allergies   Review of patient's allergies indicates no known allergies.   Review of Systems Review  of Systems  Constitutional: Positive for chills. Negative for unexpected weight change. Fever: ?  HENT: Positive for congestion. Negative for sore throat.   Eyes: Negative for pain, redness and visual disturbance.  Respiratory: Positive for shortness of breath. Negative for chest tightness.   Cardiovascular: Negative for chest pain.  Gastrointestinal: Positive for abdominal pain, diarrhea and nausea. Negative for vomiting.  Genitourinary: Negative for decreased urine volume, difficulty urinating, discharge, dysuria, frequency, penile pain, scrotal swelling and testicular pain.  Musculoskeletal: Positive for back pain and myalgias.  Skin: Negative for rash.  Neurological: Positive for dizziness and headaches. Negative for syncope.  Hematological: Negative for adenopathy.  Psychiatric/Behavioral: Negative for confusion.       Hx of depression but has not taking medication.     Physical Exam Updated Vital Signs BP 116/64 (BP Location: Right Arm)   Pulse 86   Temp 99.2 F (37.3 C) (Oral)   Resp 16   SpO2 96%   Physical Exam  Constitutional: He is oriented to person, place, and time. He appears well-developed and well-nourished. No distress.  HENT:  Head: Normocephalic and atraumatic.  Right Ear: Tympanic membrane normal.  Left Ear: Tympanic membrane normal.  Mouth/Throat: Uvula is midline. No posterior oropharyngeal edema or posterior oropharyngeal erythema.  Eyes: EOM are normal. Pupils are equal, round, and reactive to light.  Neck:  Neck supple.  Cardiovascular: Normal rate and regular rhythm.   Pulmonary/Chest: Effort normal.  Abdominal: Soft. Bowel sounds are normal. There is tenderness in the epigastric area.  Musculoskeletal: Normal range of motion.  Neurological: He is alert and oriented to person, place, and time. No cranial nerve deficit.  Skin: Skin is warm and dry.  Psychiatric: He has a normal mood and affect. His behavior is normal.  Nursing note and vitals  reviewed.    ED Treatments / Results  Labs (all labs ordered are listed, but only abnormal results are displayed) Labs Reviewed  COMPREHENSIVE METABOLIC PANEL - Abnormal; Notable for the following:       Result Value   Calcium 8.8 (*)    All other components within normal limits  CBC - Abnormal; Notable for the following:    WBC 3.3 (*)    Platelets 105 (*)    All other components within normal limits  URINALYSIS, ROUTINE W REFLEX MICROSCOPIC (NOT [facility]) - Abnormal; Notable for the following:    Hgb urine dipstick SMALL (*)    All other components within normal limits  URINE MICROSCOPIC-ADD ON - Abnormal; Notable for the following:    Squamous Epithelial / LPF 0-5 (*)    Bacteria, UA FEW (*)    All other components within normal limits  LIPASE, BLOOD  RAPID HIV SCREEN (HIV 1/2 AB+AG)    Radiology Dg Chest 2 View  Result Date: 10/16/2015 CLINICAL DATA:  Cough since September. Body aches, fever and diarrhea. EXAM: CHEST  2 VIEW COMPARISON:  09/20/2015 FINDINGS: The heart size and mediastinal contours are within normal limits. Both lungs are clear. The visualized skeletal structures are unremarkable. IMPRESSION: Normal chest x-ray. Electronically Signed   By: Rudie MeyerP.  Gallerani M.D.   On: 10/16/2015 17:50    Procedures Procedures (including critical care time)  Medications Ordered in ED Medications  0.9 %  sodium chloride infusion ( Intravenous Stopped 10/16/15 1903)  dextrose 5% lactated ringers bolus 500 mL (500 mLs Intravenous New Bag/Given 10/16/15 2058)  ondansetron (ZOFRAN) injection 4 mg (4 mg Intravenous Given 10/16/15 1718)  HYDROcodone-acetaminophen (NORCO/VICODIN) 5-325 MG per tablet 1 tablet (1 tablet Oral Given 10/16/15 1936)     Initial Impression / Assessment and Plan / ED Course  I have reviewed the triage vital signs and the nursing notes.  Pertinent labs & imaging results that were available during my care of the patient were reviewed by me and considered  in my medical decision making (see chart for details).  Clinical Course    Final Clinical Impressions(s) / ED Diagnoses  25 y.o. male with n/v/d stable for d/c after IV hydration and Zofran. Patient feeling better and eating and drinking without difficulty. Discussed with the patient clinical and lab findings and plan of care. All questioned fully answered. He will call me if any problems arise.   Final diagnoses:  Gastroenteritis    New Prescriptions New Prescriptions   ONDANSETRON (ZOFRAN) 4 MG TABLET    Take 1 tablet (4 mg total) by mouth every 6 (six) hours.     [place] Miguel[place] M Arieh Bogue, [place]NP 10/16/15 2143    Jerelyn ScottMartha Linker, MD 10/16/15 [phone]

## 2015-10-16 NOTE — ED Triage Notes (Signed)
Pt reports having bodyaches, fever, diarrhea, nausea and fatigue since yesterday. No acute distress noted at triage.

## 2015-10-17 ENCOUNTER — Telehealth (HOSPITAL_BASED_OUTPATIENT_CLINIC_OR_DEPARTMENT_OTHER): Payer: Self-pay | Admitting: Emergency Medicine

## 2016-03-02 ENCOUNTER — Encounter (HOSPITAL_COMMUNITY): Payer: Self-pay | Admitting: Emergency Medicine

## 2016-03-02 ENCOUNTER — Emergency Department (HOSPITAL_COMMUNITY)
Admission: EM | Admit: 2016-03-02 | Discharge: 2016-03-02 | Disposition: A | Discharge status: Home / Self Care | Payer: Medicaid Other | Attending: Emergency Medicine | Admitting: Emergency Medicine

## 2016-03-02 DIAGNOSIS — Z79899 Other long term (current) drug therapy: Secondary | ICD-10-CM | POA: Insufficient documentation

## 2016-03-02 DIAGNOSIS — F17210 Nicotine dependence, cigarettes, uncomplicated: Secondary | ICD-10-CM | POA: Diagnosis not present

## 2016-03-02 DIAGNOSIS — R220 Localized swelling, mass and lump, head: Secondary | ICD-10-CM | POA: Diagnosis present

## 2016-03-02 DIAGNOSIS — R599 Enlarged lymph nodes, unspecified: Secondary | ICD-10-CM | POA: Diagnosis not present

## 2016-03-02 DIAGNOSIS — R590 Localized enlarged lymph nodes: Secondary | ICD-10-CM

## 2016-03-02 DIAGNOSIS — B350 Tinea barbae and tinea capitis: Secondary | ICD-10-CM | POA: Diagnosis not present

## 2016-03-02 MED ORDER — GRISEOFULVIN ULTRAMICROSIZE 125 MG PO TABS: 375.0000 mg | ORAL_TABLET | Freq: Every day | ORAL | 0 refills | Status: DC

## 2016-03-02 NOTE — ED Triage Notes (Signed)
Pt reports an abscess to the back of his head. Pt states it was bigger but has gone down in size. Pt reports this has been going on now for 2 weeks 

## 2016-03-02 NOTE — ED Provider Notes (Signed)
[facility] Provider Note   CSN: [id number] Arrival date & time: 03/02/16  1914     History   Chief Complaint Chief Complaint  Patient presents with  . Abscess    back of head    HPI Steven [name] is a 26 y.o. male.  Patient presents with complaint of swelling to the back of his head for 2 weeks. Patient also notes flaking to his scalp. Patient states that he recently had braids in. The swelling on the back of the head became more painful prompting emergency department visit. No fever, nausea or vomiting. The onset of this condition was acute. The course is gradually worse. Aggravating factors: none. Alleviating factors: none.        Past Medical History:  Diagnosis Date  . Kidney stone   . Kidney stone     There are no active problems to display for this patient.   History reviewed. No pertinent surgical history.     Home Medications    Prior to Admission medications   Medication Sig Start Date End Date Taking? Authorizing Provider  benzonatate (TESSALON) 100 MG capsule Take 1 capsule (100 mg total) by mouth every 8 (eight) hours. 09/20/15   Renne Crigler, PA-C  griseofulvin (GRIS-PEG) 125 MG tablet Take 3 tablets (375 mg total) by mouth daily. Take for 6 weeks. 03/02/16   Renne Crigler, PA-C  ibuprofen (ADVIL,MOTRIN) 200 MG tablet Take 600 mg by mouth every 6 (six) hours as needed for moderate pain.    Historical Provider, MD  ondansetron (ZOFRAN) 4 MG tablet Take 1 tablet (4 mg total) by mouth every 6 (six) hours. 10/16/15   Hope Orlene Och, NP    Family History No family history on file.  Social History Social History  Substance Use Topics  . Smoking status: Current Some Day Smoker    Packs/day: 0.50    Types: Cigarettes  . Smokeless tobacco: Never Used  . Alcohol use Yes     Comment: occasional     Allergies   Patient has no known allergies.   Review of Systems Review of Systems  Constitutional: Negative for fever.  Gastrointestinal:  Negative for nausea and vomiting.  Skin: Negative for color change.       Positive for scalp swelling  Hematological: Negative for adenopathy.     Physical Exam Updated Vital Signs BP 101/64 (BP Location: Right Arm)   Pulse 80   Temp 97.8 F (36.6 C) (Oral)   Resp 17   Ht 5\' 11"  (1.803 m)   Wt 108.9 kg   SpO2 100%   BMI 33.47 kg/m   Physical Exam  Constitutional: He appears well-developed and well-nourished.  HENT:  Head: Normocephalic and atraumatic.  Patient was swollen area to the occiput. There is mild scabbing. No fluctuance. Also severe flaking of the anterior scalp consistent with tinea capitis.  Eyes: Conjunctivae are normal.  Neck: Normal range of motion. Neck supple.  Pulmonary/Chest: No respiratory distress.  Neurological: He is alert.  Skin: Skin is warm and dry.  Psychiatric: He has a normal mood and affect.  Nursing note and vitals reviewed.    ED Treatments / Results   Procedures Procedures (including critical care time)  Medications Ordered in ED Medications - No data to display   Initial Impression / Assessment and Plan / ED Course  I have reviewed the triage vital signs and the nursing notes.  Pertinent labs & imaging results that were available during my care of the patient were  reviewed by me and considered in my medical decision making (see chart for details).     Patient seen and examined.   Vital signs reviewed and are as follows: BP 101/64 (BP Location: Right Arm)   Pulse 80   Temp 97.8 F (36.6 C) (Oral)   Resp 17   Ht 5\' 11"  (1.803 m)   Wt 108.9 kg   SpO2 100%   BMI 33.47 kg/m   Will place on griseofulvin 6 weeks. Patient encouraged to return with worsening pain, purulent drainage. Also encouraged to use Selsun Blue shampoo on the flaky areas.  Final Clinical Impressions(s) / ED Diagnoses   Final diagnoses:  Tinea capitis  Occipital lymphadenopathy   Patient with what appears to be tinea capitis on the scalp. Area of  swelling on the occiput is likely a reactive lymph node. Less likely karion. Will treat as above. Patient appears well, nontoxic. Return instructions as above.   New Prescriptions Discharge Medication List as of 03/02/2016  7:18 AM    START taking these medications   Details  griseofulvin (GRIS-PEG) 125 MG tablet Take 3 tablets (375 mg total) by mouth daily. Take for 6 weeks., Starting Mon 03/02/2016, Print         Charter OakJoshua Chiyeko Ferre, PA-C 03/02/16 [id number]    Nira ConnPedro Eduardo Cardama, MD 03/02/16 [phone]

## 2016-03-02 NOTE — Discharge Instructions (Signed)
Please read and follow all provided instructions.  Your diagnoses today include:  1. Tinea capitis   2. Occipital lymphadenopathy     Tests performed today include:  Vital signs. See below for your results today.   Medications prescribed:   Griseofulvin - medication for ringworm  Take any prescribed medications only as directed.  Home care instructions:  Follow any educational materials contained in this packet.  BE VERY CAREFUL not to take multiple medicines containing Tylenol (also called acetaminophen). Doing so can lead to an overdose which can damage your liver and cause liver failure and possibly death.   Follow-up instructions: Please follow-up with your primary care provider in the next 3 days for further evaluation of your symptoms.   Return instructions:   Please return to the Emergency Department if you experience worsening symptoms.   Please return if you have any other emergent concerns.  Additional Information:  Your vital signs today were: BP 116/71 (BP Location: Right Arm)    Pulse 99    Temp 97.8 F (36.6 C) (Oral)    Resp 20    Ht 5\' 11"  (1.803 m)    Wt 108.9 kg    SpO2 99%    BMI 33.47 kg/m  If your blood pressure (BP) was elevated above 135/85 this visit, please have this repeated by your doctor within one month. --------------

## 2016-03-02 NOTE — ED Notes (Signed)
Pt reports an abscess to the back of his head. Pt states it was bigger but has gone down in size. Pt reports this has been going on now for 2 weeks

## 2016-05-03 ENCOUNTER — Emergency Department (HOSPITAL_COMMUNITY)
Admission: EM | Admit: 2016-05-03 | Discharge: 2016-05-03 | Disposition: A | Discharge status: Home / Self Care | Payer: Medicaid Other | Attending: Emergency Medicine | Admitting: Emergency Medicine

## 2016-05-03 ENCOUNTER — Encounter (HOSPITAL_COMMUNITY): Payer: Self-pay

## 2016-05-03 DIAGNOSIS — H60501 Unspecified acute noninfective otitis externa, right ear: Secondary | ICD-10-CM | POA: Diagnosis not present

## 2016-05-03 DIAGNOSIS — H9201 Otalgia, right ear: Secondary | ICD-10-CM | POA: Diagnosis present

## 2016-05-03 DIAGNOSIS — F17210 Nicotine dependence, cigarettes, uncomplicated: Secondary | ICD-10-CM | POA: Insufficient documentation

## 2016-05-03 MED ORDER — OFLOXACIN 0.3 % OT SOLN: 5.0000 [drp] | Freq: Every day | OTIC | 0 refills | Status: DC

## 2016-05-03 NOTE — Discharge Instructions (Signed)
Take the ear drops as directed.  You can alternate ibuprofen and Tylenol for pain.  Return to the emergency department for any worsening pain, fever, difficult hearing, chest pain or difficulty breathing.  If you do not have a primary care doctor you see regularly, please you the list below. Please call them to arrange for follow-up.    No Primary Care Doctor Call [facility]  [phone] Other agencies that provide inexpensive medical care    [facility]  [phone]    [facility]  [phone]    [facility]  [phone]    [facility]  [phone]    [facility]  [phone]    [facility]  [phone]

## 2016-05-03 NOTE — ED Triage Notes (Signed)
Patient complains of right ear pain x 2 weeks. Reports that his ear feels swollen, NAD

## 2016-05-03 NOTE — ED Provider Notes (Signed)
[facility] Provider Note   CSN: [id number] Arrival date & time: 05/03/16  1049     History   Chief Complaint Chief Complaint  Patient presents with  . Otalgia    HPI Steven [name] is a 26 y.o. male who presents with 2 weeks of progressively worsening right ear pain. He states his pain is an 8 out of 10 currently describes it as a "throbbing." He reports associated intermittent headache and facial pain secondary to his ear pain. He denies any drainage from the ear but states that he feels like it is swollen. He denies any hearing loss or tinnitus. He has taken ibuprofen with minimal relief of pain. He states that he does use Q-tips in his ear every day. He denies any fevers, chest pain, difficulty breathing, vomiting, weakness or any other concerns.  The history is provided by the patient.    Past Medical History:  Diagnosis Date  . Kidney stone   . Kidney stone     There are no active problems to display for this patient.   History reviewed. No pertinent surgical history.     Home Medications    Prior to Admission medications   Medication Sig Start Date End Date Taking? Authorizing Provider  benzonatate (TESSALON) 100 MG capsule Take 1 capsule (100 mg total) by mouth every 8 (eight) hours. 09/20/15   Renne Crigler, PA-C  griseofulvin (GRIS-PEG) 125 MG tablet Take 3 tablets (375 mg total) by mouth daily. Take for 6 weeks. 03/02/16   Renne Crigler, PA-C  ibuprofen (ADVIL,MOTRIN) 200 MG tablet Take 600 mg by mouth every 6 (six) hours as needed for moderate pain.    Historical Provider, MD  ofloxacin (FLOXIN OTIC) 0.3 % otic solution Place 5 drops into the right ear daily. 05/03/16   Maxwell Caul, PA-C  ondansetron (ZOFRAN) 4 MG tablet Take 1 tablet (4 mg total) by mouth every 6 (six) hours. 10/16/15   Hope Orlene Och, NP    Family History No family history on file.  Social History Social History  Substance Use Topics  . Smoking status: Current Some Day Smoker     Packs/day: 0.50    Types: Cigarettes  . Smokeless tobacco: Never Used  . Alcohol use Yes     Comment: occasional     Allergies   Patient has no known allergies.   Review of Systems Review of Systems  Constitutional: Negative for fever.  HENT: Positive for ear pain. Negative for congestion, ear discharge, hearing loss, sore throat and tinnitus.   Respiratory: Negative for shortness of breath.   Cardiovascular: Negative for chest pain.  Gastrointestinal: Negative for abdominal pain, nausea and vomiting.  Genitourinary: Negative for dysuria and hematuria.  Musculoskeletal: Negative for back pain and neck pain.  Skin: Negative for rash.  Neurological: Positive for headaches. Negative for weakness and numbness.  All other systems reviewed and are negative.    Physical Exam Updated Vital Signs BP 125/84 (BP Location: Left Arm)   Pulse 65   Temp 98 F (36.7 C) (Oral)   Resp 12   Ht  (1.778 m)   Wt 104.4 kg   SpO2 100%   BMI 33.02 kg/m   Physical Exam  Constitutional: He appears well-developed and well-nourished.  HENT:  Head: Normocephalic and atraumatic.  Right Ear: No drainage. No mastoid tenderness. Tympanic membrane is erythematous. Tympanic membrane is not injected.  Left Ear: No mastoid tenderness. Tympanic membrane is not injected and not erythematous.  Mouth/Throat: Uvula  is midline, oropharynx is clear and moist and mucous membranes are normal.  Ears in normal position and is not pushed forward bilaterally. Bilateral TMs are intact without rupture. Left auditory canal with some cerumen but TM is normal. Right external auditory canal with significant erythema. TM is slightly erythematous but no injection. Patient has pain with movement of the right tragus. No mastoid tenderness, swelling, erythema or warmth. No facial swelling, warmth, tenderness.  Multiple dental caries scattered throughout but no signs of gingival swelling or dental abscess. No trismus.    No tenderness to palpation of the scalp.   Eyes: Conjunctivae and EOM are normal. Right eye exhibits no discharge. Left eye exhibits no discharge. No scleral icterus.  Pulmonary/Chest: Effort normal.  Musculoskeletal: He exhibits no deformity.  Lymphadenopathy:       Head (right side): Posterior auricular adenopathy present.       Head (left side): No posterior auricular adenopathy present.  Neurological: He is alert.  Skin: Skin is warm and dry.  Psychiatric: He has a normal mood and affect. His speech is normal and behavior is normal.     ED Treatments / Results  Labs (all labs ordered are listed, but only abnormal results are displayed) Labs Reviewed - No data to display  EKG  EKG Interpretation None       Radiology No results found.  Procedures Procedures (including critical care time)  Medications Ordered in ED Medications - No data to display   Initial Impression / Assessment and Plan / ED Course  I have reviewed the triage vital signs and the nursing notes.  Pertinent labs & imaging results that were available during my care of the patient were reviewed by me and considered in my medical decision making (see chart for details).     27 year old male who presents with 2 weeks of worsening right ear pain. No hearing loss or tinnitus associated with symptoms. No fever. Patient is afebrile, non-toxic appearing, sitting comfortably on examination table. Physical examination shows a right external auditory canal is very erythematous. TM is slightly erythematous but no injection. He has significant pain with palpation of the right tragus. No erythema, swelling or tenderness to mastoid process bilaterally. History/physical exam is not concerning for mastoiditis. Consider otitis externa given physical exam findings.  Discussed findings with patient. Will plan to treat with ofloxacin eardrops. Provided patient with a list of clinic resources to use if he does not have a PCP.  Instructed to call them today to arrange follow-up in the next 24-48 hours. Return precautions discussed. Patient expresses understanding and agreement to plan.     Final Clinical Impressions(s) / ED Diagnoses   Final diagnoses:  Acute otitis externa of right ear, unspecified type    New Prescriptions New Prescriptions   OFLOXACIN (FLOXIN OTIC) 0.3 % OTIC SOLUTION    Place 5 drops into the right ear daily.     Maxwell Caul, PA-C 05/03/16 1313    Samuel Jester, DO 05/06/16 1952

## 2016-05-11 ENCOUNTER — Emergency Department (HOSPITAL_COMMUNITY)
Admission: EM | Admit: 2016-05-11 | Discharge: 2016-05-11 | Disposition: A | Discharge status: Home / Self Care | Payer: Medicaid Other | Attending: Physician Assistant | Admitting: Physician Assistant

## 2016-05-11 ENCOUNTER — Encounter (HOSPITAL_COMMUNITY): Payer: Self-pay | Admitting: Emergency Medicine

## 2016-05-11 DIAGNOSIS — H6691 Otitis media, unspecified, right ear: Secondary | ICD-10-CM | POA: Insufficient documentation

## 2016-05-11 DIAGNOSIS — H9201 Otalgia, right ear: Secondary | ICD-10-CM | POA: Diagnosis present

## 2016-05-11 DIAGNOSIS — Z79899 Other long term (current) drug therapy: Secondary | ICD-10-CM | POA: Insufficient documentation

## 2016-05-11 DIAGNOSIS — F17210 Nicotine dependence, cigarettes, uncomplicated: Secondary | ICD-10-CM | POA: Insufficient documentation

## 2016-05-11 MED ORDER — AMOXICILLIN 500 MG PO CAPS
500.0000 mg | ORAL_CAPSULE | Freq: Once | ORAL | Status: AC
  Administered 2016-05-11: 500 mg via ORAL
  Filled 2016-05-11: qty 1

## 2016-05-11 MED ORDER — AMOXICILLIN 500 MG PO CAPS: 500.0000 mg | ORAL_CAPSULE | Freq: Three times a day (TID) | ORAL | 0 refills | Status: DC

## 2016-05-11 MED ORDER — NAPROXEN 500 MG PO TABS: 500.0000 mg | ORAL_TABLET | Freq: Two times a day (BID) | ORAL | 0 refills | Status: DC

## 2016-05-11 MED ORDER — NEOMYCIN-POLYMYXIN-HC 3.5-10000-1 OT SUSP: 4.0000 [drp] | Freq: Three times a day (TID) | OTIC | 0 refills | Status: DC

## 2016-05-11 NOTE — Discharge Instructions (Signed)
Take the prescribed medication as directed.  Can continue using warm compresses at home. Follow-up with ENT if you are having ongoing issues. Return to the ED for new or worsening symptoms.

## 2016-05-11 NOTE — ED Triage Notes (Signed)
Pt c/o continues to have right ear pain. Pt seen here for same on the 29th of April. Pt reports that he has tried prescribed medications and over the counter remedies without relief.

## 2016-05-11 NOTE — ED Provider Notes (Signed)
[facility]PT Provider Note   CSN: [id number] Arrival date & time: 05/11/16  9629     History   Chief Complaint Chief Complaint  Patient presents with  . Otalgia    HPI Steven [name] is a 26 y.o. male.  The history is provided by the patient and medical records.  Otalgia     26 year old male here with continued right ear pain. States this is been ongoing since 04/20/2016. He was seen here on 05/03/2016 and prescribed Ciprodex drops which she use as directed without any noted relief. States now his ear actually feels worse. He reports he has also noticed a swollen lymph node in the right side of his neck. He denies any difficulty swallowing. States he can still hear out of his right ear, but it doesn't sound muffled. He has not had any drainage or bleeding from the ear. States he has been taking over-the-counter pain relievers and using warm compresses as well.  Past Medical History:  Diagnosis Date  . Kidney stone   . Kidney stone     There are no active problems to display for this patient.   History reviewed. No pertinent surgical history.     Home Medications    Prior to Admission medications   Medication Sig Start Date End Date Taking? Authorizing Provider  benzonatate (TESSALON) 100 MG capsule Take 1 capsule (100 mg total) by mouth every 8 (eight) hours. 09/20/15   Renne Crigler, PA-C  griseofulvin (GRIS-PEG) 125 MG tablet Take 3 tablets (375 mg total) by mouth daily. Take for 6 weeks. 03/02/16   Renne Crigler, PA-C  ibuprofen (ADVIL,MOTRIN) 200 MG tablet Take 600 mg by mouth every 6 (six) hours as needed for moderate pain.    [provider]  ofloxacin (FLOXIN OTIC) 0.3 % otic solution Place 5 drops into the right ear daily. 05/03/16   Maxwell Caul, PA-C  ondansetron (ZOFRAN) 4 MG tablet Take 1 tablet (4 mg total) by mouth every 6 (six) hours. 10/16/15   Janne Napoleon, NP    Family History No family history on file.  Social History Social  History  Substance Use Topics  . Smoking status: Current Some Day Smoker    Packs/day: 0.50    Types: Cigarettes  . Smokeless tobacco: Never Used  . Alcohol use Yes     Comment: occasional     Allergies   Patient has no known allergies.   Review of Systems Review of Systems  HENT: Positive for ear pain.   All other systems reviewed and are negative.    Physical Exam Updated Vital Signs BP 132/76 (BP Location: Left Arm)   Pulse 80   Temp 98 F (36.7 C) (Oral)   Resp 18   SpO2 96%   Physical Exam  Constitutional: He is oriented to person, place, and time. He appears well-developed and well-nourished.  HENT:  Head: Normocephalic and atraumatic.  Mouth/Throat: Oropharynx is clear and moist.  Right EAC is swollen but TM still visible and appears erythematous; there is no drainage or signs of perforation  Eyes: Conjunctivae and EOM are normal. Pupils are equal, round, and reactive to light.  Neck: Normal range of motion.  Cardiovascular: Normal rate, regular rhythm and normal heart sounds.   Pulmonary/Chest: Effort normal and breath sounds normal.  Abdominal: Soft. Bowel sounds are normal.  Musculoskeletal: Normal range of motion.  Lymphadenopathy:       Head (right side): Submental adenopathy present. No tonsillar adenopathy present.  Neurological: He  is alert and oriented to person, place, and time.  Skin: Skin is warm and dry.  Psychiatric: He has a normal mood and affect.  Nursing note and vitals reviewed.    ED Treatments / Results  Labs (all labs ordered are listed, but only abnormal results are displayed) Labs Reviewed - No data to display  EKG  EKG Interpretation None       Radiology No results found.  Procedures Procedures (including critical care time)  Medications Ordered in ED Medications  amoxicillin (AMOXIL) capsule 500 mg (500 mg Oral Given 05/11/16 0714)     Initial Impression / Assessment and Plan / ED Course  I have reviewed the  triage vital signs and the nursing notes.  Pertinent labs & imaging results that were available during my care of the patient were reviewed by me and considered in my medical decision making (see chart for details).  26 y.o. M here with right ear pain.  Appears to have ongoing OE as well as OM at this time.  He has some enlarged lymph nodes on the right side as well which I suspect are reactive.  Will treat with amoxicillin and cortisporin.  He was given ENT follow-up for any ongoing issues.  Discussed plan with patient, he acknowledged understanding and agreed with plan of care.  Return precautions given for new or worsening symptoms.  Final Clinical Impressions(s) / ED Diagnoses   Final diagnoses:  Acute ear infection, right    New Prescriptions Discharge Medication List as of 05/11/2016  7:06 AM    START taking these medications   Details  amoxicillin (AMOXIL) 500 MG capsule Take 1 capsule (500 mg total) by mouth 3 (three) times daily., Starting Mon 05/11/2016, Print    naproxen (NAPROSYN) 500 MG tablet Take 1 tablet (500 mg total) by mouth 2 (two) times daily with a meal., Starting Mon 05/11/2016, Print    neomycin-polymyxin-hydrocortisone (CORTISPORIN) 3.5-10000-1 otic suspension Place 4 drops into the right ear 3 (three) times daily., Starting Mon 05/11/2016, Print         Garlon HatchetSanders, Ravan Schlemmer M, PA-C 05/11/16 0758    Abelino DerrickMackuen, Courteney Lyn, MD 05/14/16 [phone]

## 2016-06-10 ENCOUNTER — Encounter (HOSPITAL_COMMUNITY): Payer: Self-pay

## 2016-06-10 ENCOUNTER — Emergency Department (HOSPITAL_COMMUNITY): Payer: Medicaid Other

## 2016-06-10 ENCOUNTER — Emergency Department (HOSPITAL_COMMUNITY)
Admission: EM | Admit: 2016-06-10 | Discharge: 2016-06-10 | Disposition: A | Discharge status: Home / Self Care | Payer: Medicaid Other | Attending: Emergency Medicine | Admitting: Emergency Medicine

## 2016-06-10 DIAGNOSIS — H9201 Otalgia, right ear: Secondary | ICD-10-CM | POA: Diagnosis present

## 2016-06-10 DIAGNOSIS — F17210 Nicotine dependence, cigarettes, uncomplicated: Secondary | ICD-10-CM | POA: Insufficient documentation

## 2016-06-10 DIAGNOSIS — R591 Generalized enlarged lymph nodes: Secondary | ICD-10-CM | POA: Diagnosis not present

## 2016-06-10 LAB — CBC WITH DIFFERENTIAL/PLATELET
Basophils Absolute: 0 10*3/uL (ref 0.0–0.1)
Basophils Relative: 1 %
Eosinophils Absolute: 0.6 10*3/uL (ref 0.0–0.7)
Eosinophils Relative: 14 %
HCT: 36.7 % — ABNORMAL LOW (ref 39.0–52.0)
Hemoglobin: 12.5 g/dL — ABNORMAL LOW (ref 13.0–17.0)
Lymphocytes Relative: 47 %
Lymphs Abs: 2.1 10*3/uL (ref 0.7–4.0)
MCH: 28.3 pg (ref 26.0–34.0)
MCHC: 34.1 g/dL (ref 30.0–36.0)
MCV: 83.2 fL (ref 78.0–100.0)
Monocytes Absolute: 0.3 10*3/uL (ref 0.1–1.0)
Monocytes Relative: 7 %
Neutro Abs: 1.3 10*3/uL — ABNORMAL LOW (ref 1.7–7.7)
Neutrophils Relative %: 31 %
Platelets: 138 10*3/uL — ABNORMAL LOW (ref 150–400)
RBC: 4.41 MIL/uL (ref 4.22–5.81)
RDW: 12.9 % (ref 11.5–15.5)
WBC: 4.3 10*3/uL (ref 4.0–10.5)

## 2016-06-10 MED ORDER — IOPAMIDOL (ISOVUE-300) INJECTION 61%
INTRAVENOUS | Status: AC
  Administered 2016-06-10: 75 mL
  Filled 2016-06-10: qty 75

## 2016-06-10 MED ORDER — KETOROLAC TROMETHAMINE 30 MG/ML IJ SOLN
30.0000 mg | Freq: Once | INTRAMUSCULAR | Status: AC
  Administered 2016-06-10: 30 mg via INTRAVENOUS
  Filled 2016-06-10: qty 1

## 2016-06-10 NOTE — ED Notes (Signed)
Patient transported to CT 

## 2016-06-10 NOTE — ED Notes (Signed)
Unable to pull blood from IV. CT tech states she "does not have to have it for the CT." Pt taken to CT.

## 2016-06-10 NOTE — ED Notes (Signed)
PA in. 

## 2016-06-10 NOTE — Discharge Planning (Signed)
Spoke with pt at bedside regarding process of changing PCP on Medicaid Card.  Pt states he will contact DSS promptly [facility].

## 2016-06-10 NOTE — ED Provider Notes (Signed)
[facility] Provider Note   CSN: [id number] Arrival date & time: 06/10/16  0944   By signing my name below, I, Freida Busman, attest that this documentation has been prepared under the direction and in the presence of  Tonika Eden PA-C. Electronically Signed: Freida Busman, Scribe. 06/10/2016. 10:29 AM.  History   Chief Complaint Chief Complaint  Patient presents with  . Otalgia    The history is provided by the patient. No language interpreter was used.    HPI Comments:  Steven [name] is a 26 y.o. male who presents to the Emergency Department complaining of persistent right ear pain that initially began ~1.5 months ago. Pt has been evaluated for the same twice and has been treated with ofloxacin and amoxicillin. He states the amoxicillin provided relief for a few days, but his ear pain returned as soon as he stopped the abx. No recent trauma. No recent swimming. He has associated high pitched tinnitus on the right. He also notes nasal congestion that he has had for ~ 2 months. He denies  fever, sore throat, cough, SOB, CP, sinus pressure, chills, eye pain, and vision changes.  Pt is a current smoker. He denies illicit drug use. He Reports having an intermittent male sexual partner, and he was last tested in March for STDs. At that time he was positive for syphilis, and he thinks he got treatment.    Past Medical History:  Diagnosis Date  . Kidney stone   . Kidney stone     There are no active problems to display for this patient.   History reviewed. No pertinent surgical history.     Home Medications    Prior to Admission medications   Medication Sig Start Date End Date Taking? Authorizing Provider  amoxicillin (AMOXIL) 500 MG capsule Take 1 capsule (500 mg total) by mouth 3 (three) times daily. 05/11/16   Garlon Hatchet, PA-C  benzonatate (TESSALON) 100 MG capsule Take 1 capsule (100 mg total) by mouth every 8 (eight) hours. 09/20/15   Renne Crigler, PA-C    griseofulvin (GRIS-PEG) 125 MG tablet Take 3 tablets (375 mg total) by mouth daily. Take for 6 weeks. 03/02/16   Renne Crigler, PA-C  ibuprofen (ADVIL,MOTRIN) 200 MG tablet Take 600 mg by mouth every 6 (six) hours as needed for moderate pain.    [provider]  naproxen (NAPROSYN) 500 MG tablet Take 1 tablet (500 mg total) by mouth 2 (two) times daily with a meal. 05/11/16   Garlon Hatchet, PA-C  neomycin-polymyxin-hydrocortisone (CORTISPORIN) 3.5-10000-1 otic suspension Place 4 drops into the right ear 3 (three) times daily. 05/11/16   Garlon Hatchet, PA-C  ofloxacin (FLOXIN OTIC) 0.3 % otic solution Place 5 drops into the right ear daily. 05/03/16   Maxwell Caul, PA-C  ondansetron (ZOFRAN) 4 MG tablet Take 1 tablet (4 mg total) by mouth every 6 (six) hours. 10/16/15   Janne Napoleon, NP    Family History No family history on file.  Social History Social History  Substance Use Topics  . Smoking status: Current Some Day Smoker    Packs/day: 0.50    Types: Cigarettes  . Smokeless tobacco: Never Used  . Alcohol use Yes     Comment: occasional     Allergies   Patient has no known allergies.   Review of Systems Review of Systems  Constitutional: Negative for chills and fever.  HENT: Positive for congestion, ear pain and tinnitus. Negative for ear discharge, sinus pain,  sinus pressure and sore throat.   Eyes: Negative for pain and visual disturbance.  Respiratory: Negative for shortness of breath.   Cardiovascular: Negative for chest pain.  Gastrointestinal: Negative for abdominal pain.  Genitourinary: Negative for discharge, dysuria, flank pain, frequency, hematuria, penile pain, penile swelling, scrotal swelling and testicular pain.  Musculoskeletal: Negative for neck pain and neck stiffness.  Skin: Negative for rash.  Allergic/Immunologic: Negative for immunocompromised state.  Neurological: Negative for dizziness, light-headedness and headaches.  Hematological:  Positive for adenopathy.     Physical Exam Updated Vital Signs BP 102/77 (BP Location: Right Arm)   Pulse 69   Temp 98 F (36.7 C) (Oral)   Resp 18   SpO2 100%   Physical Exam  Constitutional: He is oriented to person, place, and time. He appears well-developed and well-nourished. No distress.  HENT:  Head: Normocephalic and atraumatic.  Right Ear: Tympanic membrane, external ear and ear canal normal. Tympanic membrane is not injected, not erythematous, not retracted and not bulging.  Left Ear: Tympanic membrane, external ear and ear canal normal. Tympanic membrane is not injected, not erythematous, not retracted and not bulging.  Nose: Mucosal edema present. Right sinus exhibits no maxillary sinus tenderness and no frontal sinus tenderness. Left sinus exhibits no maxillary sinus tenderness and no frontal sinus tenderness.  Mouth/Throat: Uvula is midline, oropharynx is clear and moist and mucous membranes are normal. No dental caries.  Posterior auricular lymphadenopathy on the right  TTP to external ear   Eyes: Conjunctivae, EOM and lids are normal. Pupils are equal, round, and reactive to light.  Cardiovascular: Normal rate, regular rhythm, normal heart sounds and intact distal pulses.   Pulmonary/Chest: Effort normal and breath sounds normal. No respiratory distress.  Abdominal: He exhibits no distension.  Lymphadenopathy:    He has cervical adenopathy (LAD visible and TTP).  Neurological: He is alert and oriented to person, place, and time.  Skin: Skin is warm and dry.  Nursing note and vitals reviewed.    ED Treatments / Results  DIAGNOSTIC STUDIES:  Oxygen Saturation is 98% on RA, normal by my interpretation.    COORDINATION OF CARE:  10:19 AM Discussed treatment plan with pt at bedside and pt agreed to plan.  Labs (all labs ordered are listed, but only abnormal results are displayed) Labs Reviewed  CBC WITH DIFFERENTIAL/PLATELET - Abnormal; Notable for the  following:       Result Value   Hemoglobin 12.5 (*)    HCT 36.7 (*)    Platelets 138 (*)    Neutro Abs 1.3 (*)    All other components within normal limits  RPR  HIV ANTIBODY (ROUTINE TESTING)  I-STAT CHEM 8, ED  GC/CHLAMYDIA PROBE AMP ([place]) NOT AT [facility]    EKG  EKG Interpretation None       Radiology Ct Soft Tissue Neck W Contrast  Result Date: 06/10/2016 CLINICAL DATA:  26 year old male with right side year and neck pain for 2 weeks. Swelling anterior to the ear. Some hearing loss. Lymphadenopathy. EXAM: CT NECK WITH CONTRAST TECHNIQUE: Multidetector CT imaging of the neck was performed using the standard protocol following the bolus administration of intravenous contrast. CONTRAST:  75mL ISOVUE-300 IOPAMIDOL (ISOVUE-300) INJECTION 61% COMPARISON:  Head CT without contrast 01/12/2011. FINDINGS: Pharynx and larynx: Negative larynx. The hypopharynx is within normal limits. Effaced oropharyngeal and nasopharyngeal airways appears related to generalized tonsillar hypertrophy. No discrete tonsillar mass or heterogeneity. Negative parapharyngeal spaces. Negative retropharyngeal space. Salivary glands: Sublingual space and  submandibular glands appear normal. The parotid glands appear within normal limits, although there are multiple rounded hyperdense nodules in both parotid spaces which appear to be increased bilateral parotid space lymph nodes. See series 3, images 19-21. There are also associated right greater than left enlarged pre-auricular lymph nodes. See sagittal image 9 on the right. Thyroid: Mild thyromegaly. Lymph nodes: Generalized bilateral cervical lymphadenopathy. All nodal stations appear a affected, including level 5 nodes up to 11 mm, level 1 a nodes up to 14 mm, and level 4 nodes up to 13 mm short axis. The largest nodes are at the level IIa stations measuring up to 14 mm short axis. No cystic or necrotic nodes are identified. Vascular: Major vascular structures in the neck  and at the skullbase remain patent. Limited intracranial: Negative. Visualized orbits: Negative. Mastoids and visualized paranasal sinuses: Bilateral tympanic cavities and mastoids are well pneumatized. There may be some chronic mastoid sclerosis which is greater on the right. The paranasal sinuses are clear aside from mild mucosal thickening in the left maxillary alveolar recess. Skeleton: No acute or suspicious osseous lesion identified. There is some bilateral molar dental caries. Upper chest: Possible at hypertrophy of the thymus in the visible anterior superior mediastinum on series 3, image 108. No other superior mediastinal lymphadenopathy identified. Negative lung apices. IMPRESSION: 1. Generalized lymphadenopathy throughout the neck, including probable increased bilateral parotid space and pre-auricular nodes. The appearance is suspicious for a Lymphoproliferative disorder, including Lymphoma and Leukemia. The largest nodes should be amenable to ultrasound-guided needle biopsy if necessary. 2. Superimposed nonspecific tonsillar hypertrophy, and possibly also thymus hypertrophy in the anterior superior mediastinum, which can also be seen with lymphoproliferative diseases. Electronically Signed   By: Odessa Fleming M.D.   On: 06/10/2016 12:34    Procedures Procedures (including critical care time)  Medications Ordered in ED Medications  iopamidol (ISOVUE-300) 61 % injection (75 mLs  Contrast Given 06/10/16 1211)  ketorolac (TORADOL) 30 MG/ML injection 30 mg (30 mg Intravenous Given 06/10/16 1309)     Initial Impression / Assessment and Plan / ED Course  I have reviewed the triage vital signs and the nursing notes.  Pertinent labs & imaging results that were available during my care of the patient were reviewed by me and considered in my medical decision making (see chart for details).     Initial assessment concerning for patient's visible and palpable lymphadenopathy along his neck. On further  evaluation, patient's ear pain is not within or on the ear, but on his neck where the lymphadenopathy is. Physical exam showed no abnormalities of the ear, ear canal, or tympanic membrane. Will order CT of neck for further evaluation for lymphadenopathy. Lab with slightly decreased platelets.  CT shows no sign of abscess or infection the neck, but concern for etiology of lymphadenopathy. Suggest biopsy for further evaluation. Will work with case management to set up follow-up appointment primary care so that primary care can arrange for biopsy. Patient to return to ED if pain worsens, he develops fever, chills, or symptoms change. Patient to continue to manage pain using over-the-counter antiinflammatories. Patient agrees to plan, and will assist in trying to arrange appointment with primary care.   Final Clinical Impressions(s) / ED Diagnoses   Final diagnoses:  Lymphadenopathy    New Prescriptions Discharge Medication List as of 06/10/2016  2:07 PM     I personally performed the services described in this documentation, which was scribed in my presence. The recorded information has been reviewed and  is accurate.     Alveria Apley, PA-C 06/10/16 1423    Nira Conn, MD 06/13/16 [phone]

## 2016-06-10 NOTE — Discharge Instructions (Signed)
Follow up with primary care (medicaid assigned or [facility]) as soon as possible. Can also contact ENT for follow up (number above). Return to ED if there is change in symptoms, including worsening pain, neck stiffness, fever, or chills. Can take anti-inflammatories as needed for pain.

## 2016-06-10 NOTE — Discharge Planning (Signed)
EDCM contacted to assist with follow-up appointment.  Pt has [employer] with an assigned PCP  [facility]   Address: [address] [place], [place] [id number] [phone]   Pt must contact DSS to have PCP changed in order to be seen by another MD.   Jaleena Viviani J. Lucretia RoersWood, RN, BSN, [place] [phone]

## 2016-06-10 NOTE — ED Triage Notes (Signed)
Patient complains of ongoing right ear pain x 1 month, no other associated symptoms

## 2016-06-11 LAB — RPR: RPR Ser Ql: NONREACTIVE

## 2016-06-11 LAB — GC/CHLAMYDIA PROBE AMP (~~LOC~~) NOT AT ARMC
Chlamydia: NEGATIVE
Neisseria Gonorrhea: NEGATIVE

## 2016-06-11 LAB — HIV ANTIBODY (ROUTINE TESTING W REFLEX): HIV Screen 4th Generation wRfx: REACTIVE — AB

## 2016-06-12 LAB — HIV 1/2 AB DIFFERENTIATION
HIV 1 Ab: POSITIVE — AB
HIV 2 Ab: NEGATIVE

## 2016-06-28 ENCOUNTER — Emergency Department (HOSPITAL_COMMUNITY): Payer: Medicaid Other

## 2016-06-28 ENCOUNTER — Emergency Department (HOSPITAL_COMMUNITY)
Admission: EM | Admit: 2016-06-28 | Discharge: 2016-06-28 | Disposition: A | Discharge status: Home / Self Care | Payer: Medicaid Other | Attending: Emergency Medicine | Admitting: Emergency Medicine

## 2016-06-28 DIAGNOSIS — M5441 Lumbago with sciatica, right side: Secondary | ICD-10-CM | POA: Insufficient documentation

## 2016-06-28 DIAGNOSIS — M545 Low back pain: Secondary | ICD-10-CM | POA: Diagnosis present

## 2016-06-28 DIAGNOSIS — F17210 Nicotine dependence, cigarettes, uncomplicated: Secondary | ICD-10-CM | POA: Insufficient documentation

## 2016-06-28 MED ORDER — METHYLPREDNISOLONE 4 MG PO TBPK: ORAL_TABLET | ORAL | 0 refills | Status: DC

## 2016-06-28 MED ORDER — KETOROLAC TROMETHAMINE 30 MG/ML IJ SOLN
30.0000 mg | Freq: Once | INTRAMUSCULAR | Status: AC
  Administered 2016-06-28: 30 mg via INTRAMUSCULAR
  Filled 2016-06-28: qty 1

## 2016-06-28 MED ORDER — CYCLOBENZAPRINE HCL 10 MG PO TABS: 10.0000 mg | ORAL_TABLET | Freq: Three times a day (TID) | ORAL | 0 refills | Status: DC | PRN

## 2016-06-28 MED ORDER — CYCLOBENZAPRINE HCL 10 MG PO TABS
5.0000 mg | ORAL_TABLET | Freq: Once | ORAL | Status: AC
  Administered 2016-06-28: 5 mg via ORAL
  Filled 2016-06-28: qty 1

## 2016-06-28 NOTE — Discharge Instructions (Signed)
X-ray showed no signs of fracture. This may be related to your sciatic nerve pain. Please the the Flexeril for muscle relaxation. This medication will make you drowsy so avoid situation that could place you in danger. Take the Medrol Dosepak which is a steroid as prescribed help with inflammation. Motrin and Tylenol for pain. Warm compresses. Low back stretches. Follow-up with orthopedist.\  Workup has been normal. Please take medications as prescribed and instructed.  SEEK IMMEDIATE MEDICAL ATTENTION IF: New numbness, tingling, weakness, or problem with the use of your arms or legs.  Severe back pain not relieved with medications.  Change in bowel or bladder control.  Urinary retention.  Numbness in your groin.  Increasing pain in any areas of the body (such as chest or abdominal pain).  Shortness of breath, dizziness or fainting.  Nausea (feeling sick to your stomach), vomiting, fever, or sweats.

## 2016-06-28 NOTE — ED Notes (Signed)
Called pt and notified him that he had left his prescriptions.  Pt stated he thought the 1 shot he received was going to heal him, but that he would come to pick it up.

## 2016-06-28 NOTE — ED Provider Notes (Signed)
[facility]-EMERGENCY DEPT Provider Note   CSN: [id number] Arrival date & time: 06/28/16  [id number]  By signing my name below, I, Steven [name], attest that this documentation has been prepared under the direction and in the presence of Demetrios LollKenneth Yanni Ruberg, PA-C. Electronically Signed: Karren CobbleNy'kea [name], ED Scribe. 06/28/16. 10:17 AM.   History   Chief Complaint Chief Complaint  Patient presents with  . Back Pain  . Leg Pain   HPI HPI Comments: Steven [name] is a 26 y.o. male with a history of nephrolithiasis brought in by ambulance, who presents to the Emergency Department complaining sudden onset, persistent right sided lower back pain that radiates down into the leg, than began yesterday. Pt notes associated Intermittent paresthesias. Yesterday pt had an onset of lower back that he reports worsens with positional movements. Patient thinks that it is "his sciatica". Patient states the pain is worse with standing and sitting up for prolonged periods. Denies any known trauma or injury. He denies any loss of bowel or bladder, urinary retention, saddle paresthesias, urinary symptoms including dysuria, hematuria. Patient is not currently for her symptoms prior to arrival.  Past Medical History:  Diagnosis Date  . Kidney stone   . Kidney stone    There are no active problems to display for this patient.  No past surgical history on file.  Home Medications    Prior to Admission medications   Medication Sig Start Date End Date Taking? Authorizing Provider  amoxicillin (AMOXIL) 500 MG capsule Take 1 capsule (500 mg total) by mouth 3 (three) times daily. 05/11/16   Garlon HatchetSanders, Lisa M, PA-C  benzonatate (TESSALON) 100 MG capsule Take 1 capsule (100 mg total) by mouth every 8 (eight) hours. 09/20/15   Renne CriglerGeiple, Joshua, PA-C  griseofulvin (GRIS-PEG) 125 MG tablet Take 3 tablets (375 mg total) by mouth daily. Take for 6 weeks. 03/02/16   Renne CriglerGeiple, Joshua, PA-C  ibuprofen (ADVIL,MOTRIN) 200 MG tablet Take 600 mg by mouth  every 6 (six) hours as needed for moderate pain.    [provider]  naproxen (NAPROSYN) 500 MG tablet Take 1 tablet (500 mg total) by mouth 2 (two) times daily with a meal. 05/11/16   Garlon HatchetSanders, Lisa M, PA-C  neomycin-polymyxin-hydrocortisone (CORTISPORIN) 3.5-10000-1 otic suspension Place 4 drops into the right ear 3 (three) times daily. 05/11/16   Garlon HatchetSanders, Lisa M, PA-C  ofloxacin (FLOXIN OTIC) 0.3 % otic solution Place 5 drops into the right ear daily. 05/03/16   Maxwell CaulLayden, Lindsey A, PA-C  ondansetron (ZOFRAN) 4 MG tablet Take 1 tablet (4 mg total) by mouth every 6 (six) hours. 10/16/15   Janne NapoleonNeese, Hope M, NP    Family History No family history on file.  Social History Social History  Substance Use Topics  . Smoking status: Current Some Day Smoker    Packs/day: 0.50    Types: Cigarettes  . Smokeless tobacco: Never Used  . Alcohol use Yes     Comment: occasional   Allergies   Patient has no known allergies.  Review of Systems Review of Systems  Genitourinary: Negative for dysuria and hematuria.  Musculoskeletal: Positive for back pain.  Neurological: Positive for numbness.       Tingling reported of the right leg.    Physical Exam Updated Vital Signs BP 110/65 (BP Location: Right Arm)   Pulse 70   Resp 16   Ht 5\' 10"  (1.778 m)   SpO2 98%   Physical Exam  Constitutional: He is oriented to person, place, and time. He appears well-developed  and well-nourished.  Patient is overall well-appearing. Nontoxic-appearing testing on the cell phone in no acute distress  HENT:  Head: Normocephalic.  Eyes: EOM are normal.  Neck: Normal range of motion.  Cardiovascular: Intact distal pulses.   DP pulses are 2+ bilaterally.  Pulmonary/Chest: Effort normal.  Abdominal: He exhibits no distension.  Musculoskeletal: Normal range of motion.  No midline T spine or L spine tenderness. No deformities or step offs noted. Full ROM. Pelvis is stable.  Patient with right-sided paraspinal  lumbar tenderness that radiates to his right buttocks down to his right thigh. No lower extremity edema or calf tenderness.   Neurological: He is alert and oriented to person, place, and time.  Strength 5 out of 5 in lower extremities. Sensation intact to sharp/dull.  Skin: Skin is warm and dry. Capillary refill takes less than 2 seconds.  Psychiatric: He has a normal mood and affect.  Nursing note and vitals reviewed.  ED Treatments / Results  DIAGNOSTIC STUDIES: Oxygen Saturation is 98% on RA, normal by my interpretation.   COORDINATION OF CARE: 10:11 AM-Discussed next steps with pt. Pt verbalized understanding and is agreeable with the plan.   Labs (all labs ordered are listed, but only abnormal results are displayed) Labs Reviewed - No data to display  EKG  EKG Interpretation None      Radiology Dg Lumbar Spine Complete  Result Date: 06/28/2016 CLINICAL DATA:  Sudden onset persisted right-sided low back pain radiating down NG leg beginning yesterday. History of nephrolithiasis. EXAM: LUMBAR SPINE - COMPLETE 4+ VIEW COMPARISON:  KUB dated 02/06/2015. FINDINGS: Minimal levoscoliosis is stable. No acute or suspicious osseous lesion. No significant degenerative change. No evidence of pars interarticularis defect. Paravertebral soft tissues are unremarkable. Small calcification in the left lower pelvis is stable, compatible with benign phlebolith. IMPRESSION: No acute or significant findings. Electronically Signed   By: Bary Richard M.D.   On: 06/28/2016 10:38    Procedures Procedures (including critical care time)  Medications Ordered in ED Medications - No data to display  Initial Impression / Assessment and Plan / ED Course  I have reviewed the triage vital signs and the nursing notes.  Pertinent labs & imaging results that were available during my care of the patient were reviewed by me and considered in my medical decision making (see chart for details).     Patient  with back pain.  Patient does have history of kidney stones however he denies any urinary symptoms and states is not like a kidney stone. Low suspicion for ureteral stone at this time. No neurological deficits and normal neuro exam.  Patient can walk but states is painful.  X-ray of lumbar region reveals no nephrolithiasis or acute findings. No loss of bowel or bladder control.  No concern for cauda equina.  No fever, night sweats, weight loss, h/o cancer, IVDU.  Patient's symptoms seemed consistent with sciatic nerve pain. RICE protocol and pain medicine indicated and discussed with patient. We'll give Medrol Dosepak and muscle relaxers. Have given orthopedic follow-up. Patient able to abnormal gait.   Pt is hemodynamically stable, in NAD, & able to ambulate in the ED. Evaluation does not show pathology that would require ongoing emergent intervention or inpatient treatment. I explained the diagnosis to the patient. Pain has been managed & has no complaints prior to dc. Pt is comfortable with above plan and is stable for discharge at this time. All questions were answered prior to disposition. Strict return precautions for f/u to  the ED were discussed. Encouraged follow up with PCP.    Final Clinical Impressions(s) / ED Diagnoses   Final diagnoses:  Acute right-sided low back pain with right-sided sciatica   New Prescriptions Discharge Medication List as of 06/28/2016 11:06 AM    START taking these medications   Details  cyclobenzaprine (FLEXERIL) 10 MG tablet Take 1 tablet (10 mg total) by mouth 3 (three) times daily as needed for muscle spasms., Starting Sun 06/28/2016, Print    methylPREDNISolone (MEDROL DOSEPAK) 4 MG TBPK tablet Take as prescribed, Print      I personally performed the services described in this documentation, which was scribed in my presence. The recorded information has been reviewed and is accurate.     Rise Mu, PA-C 06/28/16 1721    Alvira Monday,  MD 07/01/16 [phone]

## 2016-06-28 NOTE — ED Triage Notes (Signed)
Pt. Brought in by EMS for back pain and leg pain for 2 days.

## 2016-06-29 ENCOUNTER — Encounter: Payer: Self-pay | Admitting: Infectious Diseases

## 2016-06-29 ENCOUNTER — Ambulatory Visit: Payer: Medicaid Other | Admitting: Infectious Diseases

## 2016-06-29 DIAGNOSIS — Z21 Asymptomatic human immunodeficiency virus [HIV] infection status: Secondary | ICD-10-CM

## 2016-06-29 DIAGNOSIS — B20 Human immunodeficiency virus [HIV] disease: Secondary | ICD-10-CM

## 2016-06-29 HISTORY — DX: Human immunodeficiency virus (HIV) disease: B20

## 2016-06-29 HISTORY — DX: Asymptomatic human immunodeficiency virus (hiv) infection status: Z21

## 2016-07-01 ENCOUNTER — Inpatient Hospital Stay (INDEPENDENT_AMBULATORY_CARE_PROVIDER_SITE_OTHER): Payer: Medicaid Other | Admitting: Physician Assistant

## 2016-07-09 ENCOUNTER — Ambulatory Visit (INDEPENDENT_AMBULATORY_CARE_PROVIDER_SITE_OTHER): Payer: Medicaid Other | Admitting: Infectious Diseases

## 2016-07-09 ENCOUNTER — Other Ambulatory Visit (HOSPITAL_COMMUNITY)
Admission: RE | Admit: 2016-07-09 | Discharge: 2016-07-09 | Disposition: A | Discharge status: Home / Self Care | Payer: Medicaid Other | Source: Ambulatory Visit | Attending: Infectious Diseases | Admitting: Infectious Diseases

## 2016-07-09 ENCOUNTER — Encounter: Payer: Self-pay | Admitting: Infectious Diseases

## 2016-07-09 VITALS — BP 103/68 | HR 78 | Temp 98.3°F | Wt 238.0 lb

## 2016-07-09 DIAGNOSIS — Z8619 Personal history of other infectious and parasitic diseases: Secondary | ICD-10-CM

## 2016-07-09 DIAGNOSIS — R454 Irritability and anger: Secondary | ICD-10-CM

## 2016-07-09 DIAGNOSIS — B20 Human immunodeficiency virus [HIV] disease: Secondary | ICD-10-CM | POA: Diagnosis not present

## 2016-07-09 DIAGNOSIS — Z113 Encounter for screening for infections with a predominantly sexual mode of transmission: Secondary | ICD-10-CM

## 2016-07-09 DIAGNOSIS — F32A Depression, unspecified: Secondary | ICD-10-CM

## 2016-07-09 HISTORY — DX: Depression, unspecified: F32.A

## 2016-07-09 HISTORY — DX: Personal history of other infectious and parasitic diseases: Z86.19

## 2016-07-09 LAB — CBC WITH DIFFERENTIAL/PLATELET
Basophils Absolute: 0 cells/uL (ref 0–200)
Basophils Relative: 0 %
Eosinophils Absolute: 136 cells/uL (ref 15–500)
Eosinophils Relative: 4 %
HCT: 38.8 % (ref 38.5–50.0)
Hemoglobin: 13.3 g/dL (ref 13.2–17.1)
Lymphocytes Relative: 33 %
Lymphs Abs: 1122 cells/uL (ref 850–3900)
MCH: 28.4 pg (ref 27.0–33.0)
MCHC: 34.3 g/dL (ref 32.0–36.0)
MCV: 82.7 fL (ref 80.0–100.0)
MPV: 9.3 fL (ref 7.5–12.5)
Monocytes Absolute: 340 cells/uL (ref 200–950)
Monocytes Relative: 10 %
Neutro Abs: 1802 cells/uL (ref 1500–7800)
Neutrophils Relative %: 53 %
Platelets: 144 10*3/uL (ref 140–400)
RBC: 4.69 MIL/uL (ref 4.20–5.80)
RDW: 13.9 % (ref 11.0–15.0)
WBC: 3.4 10*3/uL — ABNORMAL LOW (ref 3.8–10.8)

## 2016-07-09 MED ORDER — BICTEGRAVIR-EMTRICITAB-TENOFOV 50-200-25 MG PO TABS: 1.0000 | ORAL_TABLET | Freq: Every day | ORAL | 5 refills | Status: DC

## 2016-07-09 MED FILL — BIKTARVY 50-200-25 MG TABS: 50-200-25 | 30 days supply | Qty: 30 | Fill #0

## 2016-07-09 NOTE — Assessment & Plan Note (Signed)
New patient here to establish for HIV care. I discussed with Steven [name] treatment options/side effects, benefits of treatment and long-term outcomes. I discussed how HIV is transmitted and the process of untreated HIV including increased risk for opportunistic infections, cancer, dementia and renal failure. He was counseled on routine HIV care including medication adherence, blood monitoring, necessary vaccines and follow up visits. Counseled regarding safe sex practices including: condom use, partner disclosure, limiting partners. He spent time talking with our pharmacist Ladona Ridgelaylor regarding successful practices of ART and understands to reach out to our clinic in the future with questions.   I decided to start him on Biktarvy today. He currently has Medicaid and draws SSI for income. Will return to clinic in 4 weeks to review lab work, continue HIV education and monitor for drug tolerability.  I spent greater than 45 minutes with the patient today. Greater than 50% of the time spent face-to-face counseling and coordination of care re: HIV and health maintenance.
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
## 2016-07-09 NOTE — Patient Instructions (Addendum)
Wonderful to meet you today.   We are going to start you on a new medication called BIKTARVY. Take this medication every day around the same time with or without food.   We will check lab work today - please set up your MyChart so you can have direct access to them.   When you come back to see pharmacy please bring your medications you are taking.   You will come back to see Steven [name] in 6 weeks with labs the week before.

## 2016-07-09 NOTE — Progress Notes (Signed)
HPI: Steven [name] is a 26 y.o. male who presents today for his initial HIV visit.  Allergies: No Known Allergies  Vitals: Temp: 98.3 F (36.8 C) (07/05 1104) Temp Source: Oral (07/05 1104) BP: 103/68 (07/05 1104) Pulse Rate: 78 (07/05 1104)  Past Medical History: Past Medical History:  Diagnosis Date  . History of syphilis 07/09/2016  . HIV (human immunodeficiency virus infection) ([facility]) 06/29/2016   Dx 06/10/2016  . Kidney Braden Deloach     Social History: Social History   Social History  . Marital status: Single    Spouse name: N/A  . Number of children: N/A  . Years of education: N/A   Social History Main Topics  . Smoking status: Current Some Day Smoker    Packs/day: 0.50    Types: Cigarettes  . Smokeless tobacco: Never Used  . Alcohol use Yes     Comment: occasional  . Drug use: No  . Sexual activity: Yes    Partners: Male    Birth control/ protection: Condom   Other Topics Concern  . None   Social History Narrative  . None    Previous Regimen: N/A  Current Regimen: None  Labs: No results found for: HIV1RNAQUANT, HIV1RNAVL, CD4TABS, HEPBSAB, HEPBSAG, HCVAB  CrCl: CrCl cannot be calculated (Patient's most recent lab result is older than the maximum 21 days allowed.).  Lipids: No results found for: CHOL, TRIG, HDL, CHOLHDL, VLDL, LDLCALC  Assessment: Mr. Steven [name] is a 26 year old male who presents today for his initial HIV visit to discuss starting treatment. He currently takes a daily depression medication but cannot recall the name. [facility] and will bring it at his next appointment.  We discussed the importance of therapy and not missing any doses. He was concerned that having HIV meant "it will kill me". I reassured him that if he takes ART daily and does not miss doses he should live a very similar life as someone without HIV. He was relieved and confirmed that he will take his medication everyday.   After discussion with  Rexene AlbertsStephanie Dixon, NP Steven [name] will start Biktarvy for treatment of his HIV. Steven [name] mentioned that he has started taking vitamins and probiotics since finding out he was HIV-positive. I informed him that his multivitamins may interact with Biktarvy and advised him to stop taking those for now. I also recommended he call us if he is going to start any new medications, OTCs or herbal supplements.   Steven [name] [facility] and have it sent to his home. I confirmed his address and telephone number. I also provided the patient with a pill box and key chain to help with adherence.  Recommendations: - Start Biktarvy daily - [facility] and mail to patient's home - Follow up appointment scheduled with pharmacy clinic 8/2 at 10:30 AM  Steven [name], PharmD, BCPS PGY-2 Infectious Diseases Pharmacy Resident Pager: [phone] 07/09/2016, 11:59 AM

## 2016-07-09 NOTE — Assessment & Plan Note (Addendum)
Will reassess titer today. No active s/s concerning for reinfection, however he is at high risk. Urethral and rectal swabs to assess for GC/C. Suspect chlamydia infection with complaints of intermittent perineal/scrotal pain.

## 2016-07-09 NOTE — Assessment & Plan Note (Addendum)
Currently being treated with medications but uncertain as to name. [facility]. Will see if we can request medication records to ensure no interaction with ART. Seems to be under control with new medication and he reports incorporating calming techniques into his life and making healthier changes that do not trigger anger/depression. I informed him that we have a counselor here that can assist should he need that. He declined at this time and [facility].

## 2016-07-10 LAB — COMPLETE METABOLIC PANEL WITH GFR
ALT: 25 U/L (ref 9–46)
AST: 24 U/L (ref 10–40)
Albumin: 4.3 g/dL (ref 3.6–5.1)
Alkaline Phosphatase: 37 U/L — ABNORMAL LOW (ref 40–115)
BUN: 12 mg/dL (ref 7–25)
CO2: 24 mmol/L (ref 20–31)
Calcium: 9 mg/dL (ref 8.6–10.3)
Chloride: 100 mmol/L (ref 98–110)
Creat: 0.83 mg/dL (ref 0.60–1.35)
GFR, Est African American: >89 mL/min (ref >=60)
GFR, Est Non African American: >89 mL/min (ref >=60)
Glucose, Bld: 80 mg/dL (ref 65–99)
Potassium: 4.3 mmol/L (ref 3.5–5.3)
Sodium: 135 mmol/L (ref 135–146)
Total Bilirubin: 0.3 mg/dL (ref 0.2–1.2)
Total Protein: 7 g/dL (ref 6.1–8.1)

## 2016-07-10 LAB — HEPATITIS A ANTIBODY, TOTAL: Hep A Total Ab: NONREACTIVE

## 2016-07-10 LAB — URINALYSIS
Bilirubin Urine: NEGATIVE
Glucose, UA: NEGATIVE
Hgb urine dipstick: NEGATIVE
Ketones, ur: NEGATIVE
Leukocytes, UA: NEGATIVE
Nitrite: NEGATIVE
Protein, ur: NEGATIVE
Specific Gravity, Urine: 1.018 (ref 1.001–1.035)
pH: 6 (ref 5.0–8.0)

## 2016-07-10 LAB — HEPATITIS C ANTIBODY: HCV Ab: NEGATIVE

## 2016-07-10 LAB — CYTOLOGY, (ORAL, ANAL, URETHRAL) ANCILLARY ONLY
Chlamydia: NEGATIVE
Neisseria Gonorrhea: NEGATIVE

## 2016-07-10 LAB — LIPID PANEL
Cholesterol: 107 mg/dL (ref <200)
HDL: 57 mg/dL (ref >40)
LDL Cholesterol: 44 mg/dL (ref <100)
Total CHOL/HDL Ratio: 1.9 Ratio (ref <5.0)
Triglycerides: 30 mg/dL (ref <150)
VLDL: 6 mg/dL (ref <30)

## 2016-07-10 LAB — T-HELPER CELL (CD4) - (RCID CLINIC ONLY)
CD4 % Helper T Cell: 21 % — ABNORMAL LOW (ref 33–55)
CD4 T Cell Abs: 280 /uL — ABNORMAL LOW (ref 400–2700)

## 2016-07-10 LAB — HEPATITIS B SURFACE ANTIGEN: Hepatitis B Surface Ag: NEGATIVE

## 2016-07-10 LAB — HEPATITIS B SURFACE ANTIBODY,QUALITATIVE: Hep B S Ab: NEGATIVE

## 2016-07-10 LAB — RPR

## 2016-07-12 LAB — QUANTIFERON TB GOLD ASSAY (BLOOD)
Interferon Gamma Release Assay: NEGATIVE
Mitogen-Nil: 8.26 IU/mL
Quantiferon Nil Value: 0.1 IU/mL
Quantiferon Tb Ag Minus Nil Value: <0 IU/mL

## 2016-07-13 ENCOUNTER — Telehealth: Payer: Self-pay | Admitting: *Deleted

## 2016-07-13 LAB — CYTOLOGY, (ORAL, ANAL, URETHRAL) ANCILLARY ONLY
Chlamydia: NEGATIVE
Neisseria Gonorrhea: NEGATIVE

## 2016-07-13 NOTE — Telephone Encounter (Signed)
Call from StarkeGloria in the lab stating she has a specimen and the tube is labeled oral; however the order was placed as urethral. As the patient was having penile issues, as noted in chart she wanted to be sure that it was suppose to be oral. Message [facility] to [facility], pharmacist, who is contacting NP, [name]. Wendall MolaJacqueline Ariana Cavenaugh

## 2016-07-13 NOTE — Telephone Encounter (Signed)
Spoke with Theotis BurrowStepahnie Dixon, NP. Confirmed patient had urethral and rectal swab completed at last visit on 7/5.  Casilda Carlsaylor Hairo Garraway, PharmD, BCPS PGY-2 Infectious Diseases Pharmacy Resident Pager: [phone] 07/13/2016, 10:42 AM

## 2016-07-13 NOTE — Telephone Encounter (Signed)
Malachi BondsGloria at lab notified.

## 2016-07-14 LAB — HLA B*5701: HLA-B*5701 w/rflx HLA-B High: NEGATIVE

## 2016-07-14 LAB — HIV-1 RNA,QN PCR W/REFLEX GENOTYPE
HIV-1 RNA, QN PCR: 4.74 Log cps/mL — ABNORMAL HIGH
HIV-1 RNA, QN PCR: 55300 Copies/mL — ABNORMAL HIGH

## 2016-07-24 LAB — HIV-1 GENOTYPR PLUS

## 2016-08-05 ENCOUNTER — Encounter (INDEPENDENT_AMBULATORY_CARE_PROVIDER_SITE_OTHER): Payer: Self-pay | Admitting: Physician Assistant

## 2016-08-05 ENCOUNTER — Ambulatory Visit (INDEPENDENT_AMBULATORY_CARE_PROVIDER_SITE_OTHER): Payer: Medicaid Other | Admitting: Physician Assistant

## 2016-08-05 VITALS — BP 108/75 | HR 77 | Temp 98.2°F | Ht 71.26 in | Wt 219.8 lb

## 2016-08-05 DIAGNOSIS — Z0000 Encounter for general adult medical examination without abnormal findings: Secondary | ICD-10-CM

## 2016-08-05 DIAGNOSIS — M5441 Lumbago with sciatica, right side: Secondary | ICD-10-CM

## 2016-08-05 MED ORDER — ACETAMINOPHEN 500 MG PO TABS: 1000.0000 mg | ORAL_TABLET | Freq: Three times a day (TID) | ORAL | 0 refills | Status: AC | PRN

## 2016-08-05 MED ORDER — CYCLOBENZAPRINE HCL 10 MG PO TABS: 10.0000 mg | ORAL_TABLET | Freq: Every day | ORAL | 0 refills | Status: DC

## 2016-08-05 MED FILL — BIKTARVY 50-200-25 MG TABS: 50-200-25 | 30 days supply | Qty: 30 | Fill #1

## 2016-08-05 NOTE — Patient Instructions (Signed)
Sciatica Sciatica is pain, numbness, weakness, or tingling along the path of the sciatic nerve. The sciatic nerve starts in the lower back and runs down the back of each leg. The nerve controls the muscles in the lower leg and in the back of the knee. It also provides feeling (sensation) to the back of the thigh, the lower leg, and the sole of the foot. Sciatica is a symptom of another medical condition that pinches or puts pressure on the sciatic nerve. Generally, sciatica only affects one side of the body. Sciatica usually goes away on its own or with treatment. In some cases, sciatica may keep coming back (recur). What are the causes? This condition is caused by pressure on the sciatic nerve, or pinching of the sciatic nerve. This may be the result of:  A disk in between the bones of the spine (vertebrae) bulging out too far (herniated disk).  Age-related changes in the spinal disks (degenerative disk disease).  A pain disorder that affects a muscle in the buttock (piriformis syndrome).  Extra bone growth (bone spur) near the sciatic nerve.  An injury or break (fracture) of the pelvis.  Pregnancy.  Tumor (rare). What increases the risk? The following factors may make you more likely to develop this condition:  Playing sports that place pressure or stress on the spine, such as football or weight lifting.  Having poor strength and flexibility.  A history of back injury.  A history of back surgery.  Sitting for long periods of time.  Doing activities that involve repetitive bending or lifting.  Obesity. What are the signs or symptoms? Symptoms can vary from mild to very severe, and they may include:  Any of these problems in the lower back, leg, hip, or buttock:  Mild tingling or dull aches.  Burning sensations.  Sharp pains.  Numbness in the back of the calf or the sole of the foot.  Leg weakness.  Severe back pain that makes movement difficult. These symptoms may  get worse when you cough, sneeze, or laugh, or when you sit or stand for long periods of time. Being overweight may also make symptoms worse. In some cases, symptoms may recur over time. How is this diagnosed? This condition may be diagnosed based on:  Your symptoms.  A physical exam. Your health care provider may ask you to do certain movements to check whether those movements trigger your symptoms.  You may have tests, including:  Blood tests.  X-rays.  MRI.  CT scan. How is this treated? In many cases, this condition improves on its own, without any treatment. However, treatment may include:  Reducing or modifying physical activity during periods of pain.  Exercising and stretching to strengthen your abdomen and improve the flexibility of your spine.  Icing and applying heat to the affected area.  Medicines that help:  To relieve pain and swelling.  To relax your muscles.  Injections of medicines that help to relieve pain, irritation, and inflammation around the sciatic nerve (steroids).  Surgery. Follow these instructions at home: Medicines   Take over-the-counter and prescription medicines only as told by your health care provider.  Do not drive or operate heavy machinery while taking prescription pain medicine. Managing pain   If directed, apply ice to the affected area.  Put ice in a plastic bag.  Place a towel between your skin and the bag.  Leave the ice on for 20 minutes, 2-3 times a day.  After icing, apply heat to the   affected area before you exercise or as often as told by your health care provider. Use the heat source that your health care provider recommends, such as a moist heat pack or a heating pad.  Place a towel between your skin and the heat source.  Leave the heat on for 20-30 minutes.  Remove the heat if your skin turns bright red. This is especially important if you are unable to feel pain, heat, or cold. You may have a greater risk of  getting burned. Activity   Return to your normal activities as told by your health care provider. Ask your health care provider what activities are safe for you.  Avoid activities that make your symptoms worse.  Take brief periods of rest throughout the day. Resting in a lying or standing position is usually better than sitting to rest.  When you rest for longer periods, mix in some mild activity or stretching between periods of rest. This will help to prevent stiffness and pain.  Avoid sitting for long periods of time without moving. Get up and move around at least one time each hour.  Exercise and stretch regularly, as told by your health care provider.  Do not lift anything that is heavier than 10 lb (4.5 kg) while you have symptoms of sciatica. When you do not have symptoms, you should still avoid heavy lifting, especially repetitive heavy lifting.  When you lift objects, always use proper lifting technique, which includes:  Bending your knees.  Keeping the load close to your body.  Avoiding twisting. General instructions   Use good posture.  Avoid leaning forward while sitting.  Avoid hunching over while standing.  Maintain a healthy weight. Excess weight puts extra stress on your back and makes it difficult to maintain good posture.  Wear supportive, comfortable shoes. Avoid wearing high heels.  Avoid sleeping on a mattress that is too soft or too hard. A mattress that is firm enough to support your back when you sleep may help to reduce your pain.  Keep all follow-up visits as told by your health care provider. This is important. Contact a health care provider if:  You have pain that wakes you up when you are sleeping.  You have pain that gets worse when you lie down.  Your pain is worse than you have experienced in the past.  Your pain lasts longer than 4 weeks.  You experience unexplained weight loss. Get help right away if:  You lose control of your bowel  or bladder (incontinence).  You have:  Weakness in your lower back, pelvis, buttocks, or legs that gets worse.  Redness or swelling of your back.  A burning sensation when you urinate. This information is not intended to replace advice given to you by your health care provider. Make sure you discuss any questions you have with your health care provider. Document Released: 12/16/2000 Document Revised: 05/28/2015 Document Reviewed: 08/31/2014 Elsevier Interactive Patient Education  2017 Elsevier Inc.  

## 2016-08-05 NOTE — Progress Notes (Signed)
Subjective:  Patient ID: Steven [name], male    DOB: 01/19/1990  Age: 26 y.o. MRN: [id number]  CC: annual physical  HPI Steven Polkarimaine Coy is a 26 y.o. male with a PMH of depression, syphilis, HIV, and kidney stones presents for an annual physical. Complains of occasional right sided LBP with right sided sciatica. No saddle paresthesia, urinary incontinence, or fecal incontinence. Denies CP, palpitations, SOB, HA, abdominal pain, f/c/n/v, rash, swelling, or GI/GU sxs.    Outpatient Medications Prior to Visit  Medication Sig Dispense Refill  . bictegravir-emtricitabine-tenofovir AF (BIKTARVY) 50-200-25 MG TABS tablet Take 1 tablet by mouth daily. Try to take at the same time each day with or without food. 30 tablet 5  . cyclobenzaprine (FLEXERIL) 10 MG tablet Take 1 tablet (10 mg total) by mouth 3 (three) times daily as needed for muscle spasms. 12 tablet 0  . griseofulvin (GRIS-PEG) 125 MG tablet Take 3 tablets (375 mg total) by mouth daily. Take for 6 weeks. 126 tablet 0  . ibuprofen (ADVIL,MOTRIN) 200 MG tablet Take 600 mg by mouth every 6 (six) hours as needed for moderate pain.    . methylPREDNISolone (MEDROL DOSEPAK) 4 MG TBPK tablet Take as prescribed 21 tablet 0  . naproxen (NAPROSYN) 500 MG tablet Take 1 tablet (500 mg total) by mouth 2 (two) times daily with a meal. 30 tablet 0  . neomycin-polymyxin-hydrocortisone (CORTISPORIN) 3.5-10000-1 otic suspension Place 4 drops into the right ear 3 (three) times daily. 10 mL 0  . ofloxacin (FLOXIN OTIC) 0.3 % otic solution Place 5 drops into the right ear daily. 5 mL 0   No facility-administered medications prior to visit.      ROS Review of Systems  Constitutional: Negative for chills, fever and malaise/fatigue.  Eyes: Negative for blurred vision.  Respiratory: Negative for shortness of breath.   Cardiovascular: Negative for chest pain and palpitations.  Gastrointestinal: Negative for abdominal pain and nausea.  Genitourinary:  Negative for dysuria and hematuria.  Musculoskeletal: Negative for joint pain and myalgias.  Skin: Negative for rash.  Neurological: Negative for tingling and headaches.  Psychiatric/Behavioral: Negative for depression. The patient is not nervous/anxious.     Objective:  BP 108/75 (BP Location: Right Arm, Patient Position: Sitting, Cuff Size: Large)   Pulse 77   Temp 98.2 F (36.8 C) (Oral)   Ht 5' 11.26" (1.81 m)   Wt 219 lb 12.8 oz (99.7 kg)   SpO2 96%   BMI 30.43 kg/m   BP/Weight 08/05/2016 07/09/2016 06/28/2016  Systolic BP 108 103 110  Diastolic BP 75 68 65  Wt. (Lbs) 219.8 238 -  BMI 30.43 34.15 -  Some encounter information is confidential and restricted. Go to Review Flowsheets activity to see all data.      Physical Exam  Constitutional: He is oriented to person, place, and time.  Well developed, well nourished, NAD, polite  HENT:  Head: Normocephalic and atraumatic.  Eyes: Conjunctivae and EOM are normal. No scleral icterus.  Neck: Normal range of motion. Neck supple. No thyromegaly present.  Cardiovascular: Normal rate, regular rhythm and normal heart sounds.   Pulmonary/Chest: Effort normal and breath sounds normal.  Abdominal: Soft. Bowel sounds are normal. There is no tenderness.  Musculoskeletal: He exhibits no edema.  LEs, UEs, and back with full aROM  Neurological: He is alert and oriented to person, place, and time. He has normal reflexes. No cranial nerve deficit. Coordination normal.  Skin: Skin is warm and dry. No rash noted. No erythema.  No pallor.  Psychiatric: He has a normal mood and affect. His behavior is normal. Thought content normal.  Vitals reviewed.    Assessment & Plan:   1. Annual physical exam - CBC with Differential - Comprehensive metabolic panel - Lipid Panel  2. Acute right-sided low back pain with right-sided sciatica - Ambulatory referral to Physical Therapy - cyclobenzaprine (FLEXERIL) 10 MG tablet; Take 1 tablet (10 mg  total) by mouth at bedtime.  Dispense: 10 tablet; Refill: 0 - acetaminophen (TYLENOL) 500 MG tablet; Take 2 tablets (1,000 mg total) by mouth every 8 (eight) hours as needed.  Dispense: 21 tablet; Refill: 0   Meds ordered this encounter  Medications  . cyclobenzaprine (FLEXERIL) 10 MG tablet    Sig: Take 1 tablet (10 mg total) by mouth at bedtime.    Dispense:  10 tablet    Refill:  0    Order Specific Question:   Supervising Provider    Answer:   Quentin AngstJEGEDE, OLUGBEMIGA E [id number]]  . acetaminophen (TYLENOL) 500 MG tablet    Sig: Take 2 tablets (1,000 mg total) by mouth every 8 (eight) hours as needed.    Dispense:  21 tablet    Refill:  0    Order Specific Question:   Supervising Provider    Answer:   Quentin AngstJEGEDE, OLUGBEMIGA E [id number]    Follow-up: Return if symptoms worsen or fail to improve.   Loletta Specteroger David Arcola Freshour PA

## 2016-08-06 ENCOUNTER — Ambulatory Visit: Payer: Medicaid Other

## 2016-08-06 LAB — CBC WITH DIFFERENTIAL/PLATELET
Basophils Absolute: 0 10*3/uL (ref 0.0–0.2)
Basos: 1 %
EOS (ABSOLUTE): 0.2 10*3/uL (ref 0.0–0.4)
Eos: 4 %
Hematocrit: 41.4 % (ref 37.5–51.0)
Hemoglobin: 14.8 g/dL (ref 13.0–17.7)
Immature Grans (Abs): 0 10*3/uL (ref 0.0–0.1)
Immature Granulocytes: 1 %
Lymphocytes Absolute: 2.3 10*3/uL (ref 0.7–3.1)
Lymphs: 47 %
MCH: 29.3 pg (ref 26.6–33.0)
MCHC: 35.7 g/dL (ref 31.5–35.7)
MCV: 82 fL (ref 79–97)
Monocytes Absolute: 0.4 10*3/uL (ref 0.1–0.9)
Monocytes: 8 %
Neutrophils Absolute: 1.9 10*3/uL (ref 1.4–7.0)
Neutrophils: 39 %
Platelets: 188 10*3/uL (ref 150–379)
RBC: 5.05 x10E6/uL (ref 4.14–5.80)
RDW: 13.3 % (ref 12.3–15.4)
WBC: 4.8 10*3/uL (ref 3.4–10.8)

## 2016-08-06 LAB — COMPREHENSIVE METABOLIC PANEL
ALT: 22 IU/L (ref 0–44)
AST: 20 IU/L (ref 0–40)
Albumin/Globulin Ratio: 1.5 (ref 1.2–2.2)
Albumin: 4.6 g/dL (ref 3.5–5.5)
Alkaline Phosphatase: 54 IU/L (ref 39–117)
BUN/Creatinine Ratio: 17 (ref 9–20)
BUN: 16 mg/dL (ref 6–20)
Bilirubin Total: 0.4 mg/dL (ref 0.0–1.2)
CO2: 24 mmol/L (ref 20–29)
Calcium: 9.4 mg/dL (ref 8.7–10.2)
Chloride: 101 mmol/L (ref 96–106)
Creatinine, Ser: 0.95 mg/dL (ref 0.76–1.27)
GFR calc Af Amer: 130 mL/min/{1.73_m2} (ref >59)
GFR calc non Af Amer: 112 mL/min/{1.73_m2} (ref >59)
Globulin, Total: 3 g/dL (ref 1.5–4.5)
Glucose: 92 mg/dL (ref 65–99)
Potassium: 4.3 mmol/L (ref 3.5–5.2)
Sodium: 140 mmol/L (ref 134–144)
Total Protein: 7.6 g/dL (ref 6.0–8.5)

## 2016-08-06 LAB — LIPID PANEL
Chol/HDL Ratio: 2.3 ratio (ref 0.0–5.0)
Cholesterol, Total: 141 mg/dL (ref 100–199)
HDL: 61 mg/dL (ref >39)
LDL Calculated: 73 mg/dL (ref 0–99)
Triglycerides: 33 mg/dL (ref 0–149)
VLDL Cholesterol Cal: 7 mg/dL (ref 5–40)

## 2016-08-11 ENCOUNTER — Telehealth (INDEPENDENT_AMBULATORY_CARE_PROVIDER_SITE_OTHER): Payer: Self-pay | Admitting: Physician Assistant

## 2016-08-11 ENCOUNTER — Telehealth: Payer: Self-pay | Admitting: *Deleted

## 2016-08-11 ENCOUNTER — Telehealth: Payer: Self-pay

## 2016-08-11 DIAGNOSIS — B20 Human immunodeficiency virus [HIV] disease: Secondary | ICD-10-CM

## 2016-08-11 NOTE — Telephone Encounter (Signed)
Referral received to connect with the patient. It appears he has a lot of good questions and I would love to connect with him.  RN contacted the patient and had to leave a message with my contact information. Asked the patient to please return my call. Offered my number, email address and text

## 2016-08-11 NOTE — Telephone Encounter (Signed)
Pt said that he received a call from you but I don't see nothing in his chart  Thank you

## 2016-08-11 NOTE — Telephone Encounter (Signed)
Thank you for your help Steven [name]. I appreciate you reaching out to support [name].

## 2016-08-11 NOTE — Telephone Encounter (Signed)
Thanks Ambre  He was very receptive to care.  He should call you back. If not I will make a follow up call.

## 2016-08-11 NOTE — Telephone Encounter (Signed)
Patient calling with multiple questions.  He first wants to confirm HIV testing since he really does not believe he is positive.   I confirmed HIV testing as positive.  Patient stated he really just needed to hear it again.  He has been taking his ARV and missed 3 days since starting.   I explained his test results as it relates to HIV and stressed the importance of adherence.  Patient had many questions but within our conversation I discovered he thought participating in un protective sex was ok since he was taking HIV medications.  He thought the medications prevented transmission.   On yesterday he was sexually active as the receptive partner without protection. He states his sexual partner knew he was HIV positive but they both thought the medication protected them.   I have offered assistance for education in our office but patient prefers someone visit his home.  I think this would be a perfect referral for Ambre. Patient was very receptive to suggestion and looking forward to hearing from CurticeAmbre.  [facility]     Patient reminded of upcoming return visit with Rexene AlbertsStephanie Dixon, NP   Laurell Josephsammy K Tanielle Emigh, RN

## 2016-08-11 NOTE — Telephone Encounter (Signed)
Thank you for your help with Rayne, Tammy.

## 2016-08-18 ENCOUNTER — Ambulatory Visit: Payer: Self-pay | Admitting: *Deleted

## 2016-08-18 ENCOUNTER — Telehealth: Payer: Self-pay | Admitting: *Deleted

## 2016-08-18 DIAGNOSIS — B20 Human immunodeficiency virus [HIV] disease: Secondary | ICD-10-CM

## 2016-08-18 NOTE — Telephone Encounter (Signed)
F/U on referral and was able to speak with Steven [name]. We had a great conversation about current HIV treatment, s/s of Viral load elevation, importance of medication adherence, high risk behaviors and ways to protect himself. Home visit will be conducted today for consent to be signed and begin services

## 2016-08-20 ENCOUNTER — Ambulatory Visit: Payer: Medicaid Other | Admitting: Infectious Diseases

## 2016-08-24 ENCOUNTER — Ambulatory Visit: Payer: Self-pay | Admitting: *Deleted

## 2016-08-24 DIAGNOSIS — B20 Human immunodeficiency virus [HIV] disease: Secondary | ICD-10-CM

## 2016-08-24 NOTE — Progress Notes (Signed)
RN made a home visit with the patient. Patient is not open about his status so visit is conducted in the car. Steven [name] stated prior to our visit that he did not have transportation for his upcoming appt. Prior to our visit today RN traveled to [facility] and was able to get 4 bus passes to ensure that the patient has transportation for tomorrow's appts. Steven [name] stated he is taking his medications each day but states he has been having some diarrhea. Patient stated the diarrhea is about 3 times a day and is soft but not watery. He also stated he at times does not always have food to eat. We discussed the different food pantries and he was already aware of them.

## 2016-08-25 ENCOUNTER — Ambulatory Visit: Payer: Medicaid Other

## 2016-08-25 ENCOUNTER — Ambulatory Visit: Payer: Medicaid Other | Admitting: Infectious Diseases

## 2016-08-25 ENCOUNTER — Ambulatory Visit (INDEPENDENT_AMBULATORY_CARE_PROVIDER_SITE_OTHER): Payer: Medicaid Other | Admitting: Infectious Diseases

## 2016-08-25 ENCOUNTER — Other Ambulatory Visit (HOSPITAL_COMMUNITY)
Admission: RE | Admit: 2016-08-25 | Discharge: 2016-08-25 | Disposition: A | Discharge status: Home / Self Care | Payer: Medicaid Other | Source: Ambulatory Visit | Attending: Infectious Diseases | Admitting: Infectious Diseases

## 2016-08-25 ENCOUNTER — Telehealth: Payer: Self-pay | Admitting: *Deleted

## 2016-08-25 VITALS — BP 115/79 | HR 80 | Temp 98.5°F | Wt 231.0 lb

## 2016-08-25 DIAGNOSIS — Z202 Contact with and (suspected) exposure to infections with a predominantly sexual mode of transmission: Secondary | ICD-10-CM | POA: Insufficient documentation

## 2016-08-25 DIAGNOSIS — Z113 Encounter for screening for infections with a predominantly sexual mode of transmission: Secondary | ICD-10-CM

## 2016-08-25 DIAGNOSIS — B20 Human immunodeficiency virus [HIV] disease: Secondary | ICD-10-CM | POA: Diagnosis not present

## 2016-08-25 DIAGNOSIS — Z0000 Encounter for general adult medical examination without abnormal findings: Secondary | ICD-10-CM | POA: Diagnosis not present

## 2016-08-25 DIAGNOSIS — Z23 Encounter for immunization: Secondary | ICD-10-CM

## 2016-08-25 DIAGNOSIS — A64 Unspecified sexually transmitted disease: Secondary | ICD-10-CM | POA: Diagnosis not present

## 2016-08-25 MED ORDER — DIPHENHYDRAMINE HCL 25 MG PO CAPS: 25.0000 mg | ORAL_CAPSULE | Freq: Four times a day (QID) | ORAL | 1 refills | Status: DC | PRN

## 2016-08-25 MED ORDER — LOPERAMIDE HCL 2 MG PO CAPS: 2.0000 mg | ORAL_CAPSULE | ORAL | 0 refills | Status: DC | PRN

## 2016-08-25 MED ORDER — CEFTRIAXONE SODIUM 250 MG IJ SOLR
250.0000 mg | Freq: Once | INTRAMUSCULAR | Status: AC
  Administered 2016-08-25: 250 mg via INTRAMUSCULAR

## 2016-08-25 MED ORDER — AZITHROMYCIN 250 MG PO TABS
1000.0000 mg | ORAL_TABLET | Freq: Once | ORAL | Status: AC
  Administered 2016-08-25: 1000 mg via ORAL

## 2016-08-25 NOTE — Patient Instructions (Addendum)
Nice to see you today!  Continue taking your Biktarvy every day. Can do imodium for diarrhea and benadryl at night for itching.   We did labs today and I can call you with these results.   Please come back to see me in 1 month.   Eating Healthy on a Budget There are many ways to save money at the grocery store and continue to eat healthy. You can be successful if you plan your meals according to your budget, purchase according to your budget and grocery list, and prepare food yourself. How can I buy more food on a limited budget? Plan  Plan meals and snacks according to a grocery list and budget you create.  Look for recipes where you can cook once and make enough food for two meals.  Include meals that will "stretch" more expensive foods such as stews, casseroles, and stir-fry dishes.  Make a grocery list and make sure to bring it with you to the store. If you have a smart phone, you could use your phone to create your shopping list. Purchase  When grocery shopping, buy only the items on your grocery list and go only to the areas of the store that have the items on your list. Prepare  Some meal items can be prepared in advance. Pre-cook on days when you have extra time.  Make extra food (such as by doubling recipes) and freeze the extras in meal-sized containers or in individual portions for fast meals and snacks.  Use leftovers in your meal plan for the week.  Try some meatless meals or try "no cook" meals like salads.  When you come home from the grocery store, wash and prepare your fruits and vegetables so they are ready to use and eat. This will help reduce food waste. How can I buy more food on a limited budget? Try these tips the next time you go shopping:  [employer] store brands or generic brands.  Use coupons only for foods and brands you normally buy. Avoid buying items you wouldn't normally buy simply because they are on sale.  Check online and in newspapers for weekly  deals.  Buy healthy items from the bulk bins when available, such as herbs, spices, flours, pastas, nuts, and dried fruit.  Buy fruits and vegetables that are in season. Prices are usually lower on in-season produce.  Compare and contrast different items. You can do this by looking at the unit price on the price tag. Use it to compare different brands and sizes to find out which item is the best deal.  Choose naturally low-cost healthy items, such as carrots, potatoes, apples, bananas, and oranges. Dried or canned beans are a low-cost protein source.  Buy in bulk and freeze extra food. Items you can buy in bulk include meats, fish, poultry, frozen fruits, and frozen vegetables.  Limit the purchase of prepared or "ready-to-eat" foods, such as pre-cut fruits and vegetables and pre-made salads.  If possible, shop around to discover which grocery store offers the best prices. Some stores charge much more than other stores for the same items.  Do not shop when you are hungry. If you shop while hungry, It may be hard to stick to your list and budget.  Stick to your list and resist impulse buys. Treat your list as your official plan for the week.  Buy a variety of vegetables and fruit by purchasing fresh, frozen, and canned items.  Look beyond eye level. Foods at eye level (adult  or child eye level) are more expensive. Look at the top and bottom shelves for deals.  Be efficient with your time when shopping. The more time you spend at the store, the more money you are likely to spend.  Consider other retailers such as dollar stores, larger [employer], local fruit and vegetable stands, and farmers markets.  What are some tips for less expensive food substitutions? When choosing more expensive foods like meats and dairy, try these tips to save money:  Choose cheaper cuts of meat, such as bone-in chicken thighs and drumsticks instead skinless and boneless chicken. When you are ready to  prepare the chicken, you can remove the skin yourself to make it healthier.  Choose lean meats like chicken or [place]. When choosing ground beef, make sure it is lean ground beef (92% lean, 8% fat). If you do buy a fattier ground beef, drain the fat before eating.  Buy dried beans and peas, such as lentils, split peas, or kidney beans.  For seafood, choose canned tuna, salmon, or sardines.  Eggs are a low-cost source of protein.  Buy the larger tubs of yogurt instead of individual-sized containers.  Choose water instead of sodas and other sweetened beverages.  Skip buying chips, cookies, and other "junk food". These items are usually expensive, high in calories, and low in nutritional value.  How can I prepare the foods I buy in the healthiest way? Practice these tips for cooking foods in the healthiest way to reduce excess fat and calorie intake:  Steam, saute, grill, or bake foods instead of frying them.  Make sure half your plate is filled with fruits or vegetables. Choose from fresh, frozen, or canned fruits and vegetables. If eating canned, remember to rinse them before eating. This will remove any excess salt added for packaging.  Trim all fat from meat before cooking. Remove the skin from chicken or [place].  Spoon off fat from meat dishes once they have been chilled in the refrigerator and the fat has hardened on the top.  Use skim milk, low-fat milk, or evaporated skim milk when making cream sauces, soups, or puddings.  Substitute low-fat yogurt, sour cream, or cottage cheese for sour cream and mayonnaise in dips and dressings.  Try lemon juice, herbs, or spices to season food instead of salt, butter, or margarine.  This information is not intended to replace advice given to you by your health care provider. Make sure you discuss any questions you have with your health care provider. Document Released: 08/25/2013 Document Revised: 07/12/2015 Document Reviewed:  07/25/2013 Elsevier Interactive Patient Education  [employer].

## 2016-08-25 NOTE — Assessment & Plan Note (Addendum)
Continue Biktarvy. Not sure if it is this making him itch but we discussed ways to manage symptoms because he likes this medication. Will try Benadryl at night. Imodium for diarrhea. VL and CD4 today to monitor. Will return in 4 weeks. Counseled regarding safe sex practices. Condoms given today. Reinforced education of lab work, medication adherence as well as transmission of virus.

## 2016-08-25 NOTE — Assessment & Plan Note (Addendum)
Provided oral sex to possible partner with gonorrhea. Endorses urethral burning today although no insertive intercourse on his behalf. Urinated just before coming in today so unable to provide sample. Will assess oral and urethral swabs for gonorrhea and chlamydia. Discussed empiric treatment today as he has had a known exposure and limited transportation - will tx with azithromycin and rocephin today.

## 2016-08-25 NOTE — Assessment & Plan Note (Addendum)
Pneumovax today. Hepatitis series in 1 month to start for A and B. Will also benefit from HPV series and Menveo. Counseled on reducing processed foods and sugars and increasing exercise. Information provided.

## 2016-08-25 NOTE — Telephone Encounter (Signed)
Received a call from [facility] stating currently his rent deposit did not process for he month of August. He went to get a money order and paid the rental office but today he is concerned because the rental office is requiring a late fee of 17 dollars plus the rent.

## 2016-08-25 NOTE — Progress Notes (Signed)
PCP - Steven Specter, PA-C    Patient Active Problem List   Diagnosis Date Noted  . Exposure to gonorrhea 08/25/2016  . Healthcare maintenance 08/25/2016  . History of syphilis 07/09/2016  . Outbursts of anger 07/09/2016  . HIV (human immunodeficiency virus infection) (HCC) 06/29/2016    Patient's Medications  New Prescriptions   DIPHENHYDRAMINE (BENADRYL) 25 MG CAPSULE    Take 1-2 capsules (25-50 mg total) by mouth every 6 (six) hours as needed for itching.   LOPERAMIDE (IMODIUM) 2 MG CAPSULE    Take 1 capsule (2 mg total) by mouth as needed for diarrhea or loose stools.  Previous Medications   BICTEGRAVIR-EMTRICITABINE-TENOFOVIR AF (BIKTARVY) 50-200-25 MG TABS TABLET    Take 1 tablet by mouth daily. Try to take at the same time each day with or without food.   CYCLOBENZAPRINE (FLEXERIL) 10 MG TABLET    Take 1 tablet (10 mg total) by mouth at bedtime.   GRISEOFULVIN (GRIS-PEG) 125 MG TABLET    Take 3 tablets (375 mg total) by mouth daily. Take for 6 weeks.   METHYLPREDNISOLONE (MEDROL DOSEPAK) 4 MG TBPK TABLET    Take as prescribed   NEOMYCIN-POLYMYXIN-HYDROCORTISONE (CORTISPORIN) 3.5-10000-1 OTIC SUSPENSION    Place 4 drops into the right ear 3 (three) times daily.   OFLOXACIN (FLOXIN OTIC) 0.3 % OTIC SOLUTION    Place 5 drops into the right ear daily.  Modified Medications   No medications on file  Discontinued Medications   No medications on file    Subjective: Steven [name] presents to clinic today for routine follow up care for his HIV infection.  HPI:  HIV =  Has missed a few appointments d/t transportation issues - enjoys working with Steven [name] and appreciates her help getting him here today. Currently on regimen of BIKTARVY and tolerating this well aside from some reported itching of the flexural areas of arms and legs. Apparently this only happens at night. No visible rashes noted. Also endorses some diarrhea - 3 stools a day and only a little loose. Endorses no  complaints today suggestive of associated opportunistic infection or advancing HIV disease such as fevers, night sweats, weight loss, anorexia, cough, SOB, nausea, vomiting, diarrhea, headache, sensory changes, lymphadenopathy or oral thrush.   Exposure to Gonorrhea with recent oral sex encounter he performed on a male partner that contacted him last week after confirmation of gonorrhea infection. Steven [name] endorses some associated burning with urination. No condom use.   Health Maintenance = Steven [name] occasional exercise walking around track at apt complex. Eaties high fat, processed foods and interested in losing weight.   Review of Systems: Review of Systems  Constitutional: Negative for chills, fever and weight loss.  HENT: Negative for sore throat.   Respiratory: Negative.   Cardiovascular: Negative.   Gastrointestinal: Positive for abdominal pain (occasionally) and diarrhea. Negative for vomiting.  Genitourinary: Positive for dysuria.  Musculoskeletal: Negative for myalgias and neck pain.  Skin: Positive for itching.  Neurological: Negative for headaches.  Psychiatric/Behavioral: Negative for depression.    Past Medical History:  Diagnosis Date  . Depression 07/09/2016  . History of syphilis 07/09/2016  . HIV (human immunodeficiency virus infection) (HCC) 06/29/2016   Dx 06/10/2016  . Kidney stone    kidney stones    No Known Allergies  Objective:  Vitals:   08/25/16 1636  BP: 115/79  Pulse: 80  Temp: 98.5 F (36.9 C)  TempSrc: Oral  Weight: 231 lb (104.8 kg)  Body mass index is 31.98 kg/m.  Physical Exam  Constitutional: He is oriented to person, place, and time and well-developed, well-nourished, and in no distress.  HENT:  Mouth/Throat: Oropharynx is clear and moist.  Eyes: No scleral icterus.  Cardiovascular: Normal rate and regular rhythm.   Pulmonary/Chest: Effort normal and breath sounds normal.  Abdominal: Soft. Bowel sounds are normal.    Genitourinary:  Genitourinary Comments: defered  Lymphadenopathy:  One small palpable enlarged tonsillar lymph node to right posterior jaw line   Neurological: He is alert and oriented to person, place, and time.  Skin: Skin is warm and dry.  Psychiatric: Affect normal.    Lab Results Lab Results  Component Value Date   WBC 4.8 08/05/2016   HGB 14.8 08/05/2016   HCT 41.4 08/05/2016   MCV 82 08/05/2016   PLT 188 08/05/2016    Lab Results  Component Value Date   CREATININE 0.95 08/05/2016   BUN 16 08/05/2016   NA 140 08/05/2016   K 4.3 08/05/2016   CL 101 08/05/2016   CO2 24 08/05/2016    Lab Results  Component Value Date   ALT 22 08/05/2016   AST 20 08/05/2016   ALKPHOS 54 08/05/2016   BILITOT 0.4 08/05/2016    Lab Results  Component Value Date   CHOL 141 08/05/2016   HDL 61 08/05/2016   LDLCALC 73 08/05/2016   TRIG 33 08/05/2016   CHOLHDL 2.3 08/05/2016   CD4 T Cell Abs (/uL)  Date Value  07/09/2016 280 (L)   Lab Results  Component Value Date   HAV NON REACTIVE 07/09/2016   Lab Results  Component Value Date   HEPBSAG NEGATIVE 07/09/2016   HEPBSAB NEG 07/09/2016   Lab Results  Component Value Date   HCVAB NEGATIVE 07/09/2016   Lab Results  Component Value Date   CHLAMYDIAWP Negative 07/09/2016   CHLAMYDIAWP Negative 07/09/2016   N Negative 07/09/2016   N Negative 07/09/2016   No results found for: GCPROBEAPT Lab Results  Component Value Date   QUANTGOLD NEGATIVE 07/09/2016   No results found for: RPR    Problem List Items Addressed This Visit      Other   HIV (human immunodeficiency virus infection) (HCC) - Primary (Chronic)    Continue Biktarvy. Not sure if it is this making him itch but we discussed ways to manage symptoms because he likes this medication. Will try Benadryl at night. Imodium for diarrhea. VL and CD4 today to monitor. Will return in 4 weeks. Counseled regarding safe sex practices. Condoms given today. Reinforced  education of lab work, medication adherence as well as transmission of virus.       Relevant Orders   HIV 1 RNA quant-no reflex-bld   T-helper cell (CD4)- ([facility] clinic only)   Exposure to gonorrhea    Provided oral sex to possible partner with gonorrhea. Endorses urethral burning today although no insertive intercourse on his behalf. Urinated just before coming in today so unable to provide sample. Will assess oral and urethral swabs for gonorrhea and chlamydia. Discussed empiric treatment today as he has had a known exposure and limited transportation - will tx with azithromycin and rocephin today.       Relevant Orders   Cytology (oral, anal, urethral) ancillary only   Cytology (oral, anal, urethral) ancillary only   Healthcare maintenance    Pneumovax today. Hepatitis series in 1 month to start for A and B. Will also benefit from HPV series and Menveo. Counseled on  reducing processed foods and sugars and increasing exercise. Information provided.        Other Visit Diagnoses    STI (sexually transmitted infection)       Routine screening for STI (sexually transmitted infection)         Rexene Alberts, MSN, NP-C [facility]   08/25/16 5:29 PM

## 2016-08-27 ENCOUNTER — Ambulatory Visit: Payer: Medicaid Other | Attending: Physician Assistant | Admitting: Physical Therapy

## 2016-08-27 LAB — T-HELPER CELL (CD4) - (RCID CLINIC ONLY)
CD4 % Helper T Cell: 22 % — ABNORMAL LOW (ref 33–55)
CD4 T Cell Abs: 460 /uL (ref 400–2700)

## 2016-08-27 LAB — CYTOLOGY, (ORAL, ANAL, URETHRAL) ANCILLARY ONLY
Chlamydia: NEGATIVE
Chlamydia: NEGATIVE
Neisseria Gonorrhea: NEGATIVE
Neisseria Gonorrhea: POSITIVE — AB

## 2016-08-28 ENCOUNTER — Telehealth: Payer: Self-pay | Admitting: Infectious Diseases

## 2016-08-28 LAB — HIV-1 RNA QUANT-NO REFLEX-BLD
HIV 1 RNA Quant: 113 copies/mL — ABNORMAL HIGH
HIV-1 RNA Quant, Log: 2.05 Log copies/mL — ABNORMAL HIGH

## 2016-08-28 NOTE — Telephone Encounter (Signed)
He was going to try Benadryl for the itching first since it's only at night. He has itching to his inner arms and behind knees. Thick lotion with some Vaseline should also help ease the itching. Can try some over the counter hydrocortisone cream twice a day first before we do anything stronger.   Can't remember if he has ADAP or Medicaid. If Medicaid can send in a script for 1% hydrocortisone cream BID PRN for itching. No sure the y will pay for it though. Prolonged use of these steroid creams will make his skin lighter in the areas he applies it frequently.   Thanks [employer]

## 2016-08-28 NOTE — Telephone Encounter (Signed)
Left voicemail for Steven [name] to call back - I wanted to call him and congratulate him on the improvement of his viral load since starting his Biktarvy; he needs some motivation to keep him in care.   Was also going to inform him is gonorrhea came back positive from throat swab and he should let any partners know (he was treated empirically in office here so does NOT need to come back).   Rexene Alberts, NP

## 2016-08-28 NOTE — Telephone Encounter (Signed)
Patient returned call and was advised viral load is better and test was positive for [facility] but he has had treatment and he will be fine. Patient advised he is very happy but still having some diarrhea and wanted to know if there was something we could do the help. He also mentioned a "cream" that was discussed at the visit advised would mention it as well and give him a call back.

## 2016-08-31 ENCOUNTER — Other Ambulatory Visit: Payer: Self-pay | Admitting: Pharmacist

## 2016-08-31 MED FILL — BIKTARVY 50-200-25 MG TABS: 50-200-25 | 30 days supply | Qty: 30 | Fill #2

## 2016-09-02 ENCOUNTER — Telehealth: Payer: Self-pay | Admitting: *Deleted

## 2016-09-02 NOTE — Telephone Encounter (Signed)
Patient called to report that he has had extreme fatigue for the past couple of weeks. He advised he is tired even when he sleeps an adequate amount of time at night. He advised he has taken something to help him sleep and is still tired he has trouble focusing also. He was wondering if it could be due to the medication. Asked him if he feels maybe he has some anxiety about new diagnosis. He denied anything that he noticed but said it is possible. Advised will let [name] know and give him a call back.

## 2016-09-02 NOTE — Telephone Encounter (Signed)
[facility] did not mention fatigue during our recent clinic visit. In discussing with him further come to realize he has not had access to food and unable to secure meals every day. Tells me he does not even have a stick of butter in his home. He mentioned he was on mental health meds but aside from Vyvanse he cannot recall the second one. Used to take medications for anxiety but he does not feel it is contributing at this point. No other major changes in personal/social life.   He is working with Molli PoseyAmbre - can we reach out to her to see if she can help with food for him? He was not sure he was working with [facility] case manager either so would like to get him plugged in with them too. [facility] small percentage of people experience fatigue however this started a week ago abruptly.   Thank you!

## 2016-09-10 ENCOUNTER — Telehealth: Payer: Self-pay | Admitting: *Deleted

## 2016-09-10 NOTE — Telephone Encounter (Signed)
Contacted Bane this morning. After wishing him a Happy Birthday asked if he could reach return my call. I would love to see how things are going for him and offer assistance if needed.

## 2016-09-22 MED FILL — BIKTARVY 50-200-25 MG TABS: 50-200-25 | 30 days supply | Qty: 30 | Fill #3

## 2016-09-25 ENCOUNTER — Ambulatory Visit: Payer: Medicaid Other | Admitting: Infectious Diseases

## 2016-09-28 ENCOUNTER — Ambulatory Visit: Payer: Self-pay | Admitting: *Deleted

## 2016-09-28 DIAGNOSIS — B20 Human immunodeficiency virus [HIV] disease: Secondary | ICD-10-CM

## 2016-09-29 NOTE — Progress Notes (Signed)
Prior to visit RN spoke with Ronne Binning and he asked that I bring condoms, traveled to [facility] and pick some up.  Eliakim had company in his home so we meet in the car. During today's visit we discussed what undetectable viral load means. We discussed his current viral load and he is committed to continuing to taking his medications each day. Reassured Jovonte on the fact the medications is working he just needs to keep up the good work with medications adherence. Suhas was given condoms and lubes during today's visit and he discussed a new relationship with a partner who is also HIV positive. RN educated Jaxxen the importance of getting to know his partner and his medication compliance before having unprotected sex. Together we discussed that having unprotected sex with someone who is not complaint with medications can create resistance(some medications dont work) and they can pass there virus on to him. Patient verbalized understanding by repeating the education provided.  Reminded Deundre of his upcoming appt with Judeth Cornfield NP and 2 bus passes given to ensure he has transportation

## 2016-10-07 NOTE — Progress Notes (Signed)
Order received on 08/11/16 by [name] Dixon NP/Dr Hatcher  to evaluate patient for [facility] ([facility]) Attempt to contact was made on 08/11/16  Patient was evaluated on 08/18/16 for [facility]. Patient was consented to care at this time.   Initial Assessment Points to Consider for Care  Points are not all inclusive to services and educated provided but supports the patient's Individualized Plan of Care.  . Is Home safe for visits? Home is a upstairs garden style apartment with one way in one way out. Due to a live-in roommate who is not aware of the patient's status, hx of angry outburst and layout of apartment. Visits are conducted outside the home .  Are all firearms or weapons secure? Visits are conducted outside the home . Insurance Coverage: Medicaid . 1st HIV Diagnosis: June 2018 . Mode of HIV Transmission: Receptive MSM . Functional Status: No limitations noted . Current Housing/Needs: 2 bedroom apartment and he rents the additional room out . Social Support/System: limited, has not disclosed his status . Culture/Religion/Spirituality: No barriers to care . Educational Background: up to 11th grade of high school . Legal Issues:No pending charges or criminal history . Access/Utilization of Community Resources: Not linked to other community resources . Mental Health Concerns/Diagnosis: Hx of Outburst of Anger, Depression . Alcohol and/or Drug Use:occasional drinker and marijuana use . Risk and Knowledge of HIV and Reduction in Transmission: extensive education given on HIV risk, HIV transmission and behaviors that reduce the risk of transmission. Condoms given at this time . Nutritional Needs: none noted or expressed at this time   Frequency / Duration of [facility] visits: Effective:  3mo1, 2mo2, 1mo1  4 PRN's for complications with disease process/progression, medication changes or concerns   [facility] will assess for learning needs related to diagnosis and  treatment regimen, provide education as needed, fill pill box if needed, address any barriers which may be preventing medication compliance, and communicating with care team including physician and case manager.   Individualized Plan Of Care Certification Period from 08/18/16 to 11/16/2016  a. Type of service(s) and care to be delivered: RN Case Management  b. Frequency and duration of service: Effective 3mo1, 2mo2, 1mo1, 4 prns for complications  with disease process/progression, medication changes or  concerns . Visits/Contact may be conducted telephonically or in person to best suit the patient.  c. Activity restrictions: Pt may be up as tolerated and can safely ambulate without the need for a assistive device   d. Safety Measures: Standard Precautions/Infection Control   e. Service Objectives and Goals: Service Objectives are to assist the pt with HIV medication regimen adherence and staying in care with the Infectious Disease Clinic by identifying barriers to care. RN will address the barriers that are identified by the patient.  Patient states his current goals are to stay in care with Infectious Disease because at times he has trouble with transportation and food at times.   f. Equipment required: No additional equipment needs at this time   g. Functional Limitations: none noted, hx of rt sided sciatica  h. Rehabilitation potential: Guarded   i. Diet and Nutritional Needs: Regular Diet   j. Medications and treatments: Medications have been reconciled and  reviewed and are a part of EPIC electronic file   k. Specific therapies if needed: RN   l. Pertinent diagnoses: HIV disease,  Hx of medication NonCompliance   m. Expected outcome: Guarded      

## 2016-10-19 ENCOUNTER — Ambulatory Visit: Payer: Medicaid Other | Admitting: Infectious Diseases

## 2016-10-19 ENCOUNTER — Encounter: Payer: Self-pay | Admitting: *Deleted

## 2016-10-19 NOTE — Telephone Encounter (Signed)
Opened in ERROR

## 2016-10-19 NOTE — Patient Instructions (Signed)
RN met with client for assessment. RN reviewed Agency Services, [place] Notice of Privacy Policy, Home Safety Management Information Booklet. Home Fire Safety Assessment, Fall Risk Assessment and Suicide Risk Assessment was performed. RN also discussed information on a Living Will, Advanced Directives, and Health Care Power of Attorney. RN and Client/Designated Party educated/reviewed/signed Client Agreement and Consent for Service form along with Patient Rights and Responsibilities statement. RN developed patient specific and centered care plan. RN provided contact information and reviewed how to receive emergency help after hours for schedule changes, billing questions, reporting of safety issues, falls, concerns or any needs/questions. Standard Precaution and Infection control along with interventions to correct or prevent high risk behaviors instructed to the patient. Client/Caregiver reports understanding and agreement with the above 

## 2016-10-19 NOTE — Telephone Encounter (Signed)
Order received on 08/11/16 by Rexene Alberts NP/Dr Hatcher  to evaluate patient for [facility]) Attempt to contact was made on 08/11/16  Patient was evaluated on 08/18/16 for [facility]. Patient was consented to care at this time.   Initial Assessment Points to Consider for Care  Points are not all inclusive to services and educated provided but supports the patient's Individualized Plan of Care.  . Is Home safe for visits? Home is a upstairs garden style apartment with one way in one way out. Due to a live-in roommate who is not aware of the patient's status, hx of angry outburst and layout of apartment. Visits are conducted outside the home .  Are all firearms or weapons secure? Visits are conducted outside the home . Insurance Coverage: Medicaid . 1st HIV Diagnosis: June 2018 . Mode of HIV Transmission: Receptive MSM . Functional Status: No limitations noted . Current Housing/Needs: 2 bedroom apartment and he rents the additional room out . Social Support/System: limited, has not disclosed his status . Culture/Religion/Spirituality: No barriers to care . Educational Background: up to 11th grade of high school . Legal Issues:No pending charges or criminal history . Access/Utilization of Community Resources: Not linked to other community resources . Mental Health Concerns/Diagnosis: Hx of Outburst of Anger, Depression . Alcohol and/or Drug ZOX:[id number] drinker and marijuana use . Risk and Knowledge of HIV and Reduction in Transmission: extensive education given on HIV risk, HIV transmission and behaviors that reduce the risk of transmission. Condoms given at this time . Nutritional Needs: none noted or expressed at this time   Frequency / Duration of [facility] visits: Effective:  44mo1, 51mo2, 56mo1  4 PRN's for complications with disease process/progression, medication changes or concerns   [facility] will assess for learning needs related to diagnosis and  treatment regimen, provide education as needed, fill pill box if needed, address any barriers which may be preventing medication compliance, and communicating with care team including physician and case manager.   Individualized Plan Of Care Certification Period from 08/18/16 to 11/16/2016  a. Type of service(s) and care to be delivered: RN Case Management  b. Frequency and duration of service: Effective 17mo1, 8mo2, 87mo1, 4 prns for complications  with disease process/progression, medication changes or  concerns . Visits/Contact may be conducted telephonically or in person to best suit the patient.  c. Activity restrictions: Pt may be up as tolerated and can safely ambulate without the need for a assistive device   d. Safety Measures: Standard Precautions/Infection Control   e. Service Objectives and Goals: Service Objectives are to assist the pt with HIV medication regimen adherence and staying in care with the Infectious Disease Clinic by identifying barriers to care. RN will address the barriers that are identified by the patient.  Patient states his current goals are to stay in care with Infectious Disease because at times he has trouble with transportation and food at times.   f. Equipment required: No additional equipment needs at this time   g. Functional Limitations: none noted, hx of rt sided sciatica  h. Rehabilitation potential: Guarded   i. Diet and Nutritional Needs: Regular Diet   j. Medications and treatments: Medications have been reconciled and  reviewed and are a part of EPIC electronic file   k. Specific therapies if needed: RN   l. Pertinent diagnoses: HIV disease,  Hx of medication NonCompliance   m. Expected outcome: Guarded

## 2016-10-20 NOTE — Telephone Encounter (Signed)
I acknowledge and approve this plan of care 

## 2016-10-28 MED FILL — BIKTARVY 50-200-25 MG TABS: 50-200-25 | 30 days supply | Qty: 30 | Fill #4

## 2016-11-05 ENCOUNTER — Telehealth: Payer: Self-pay | Admitting: *Deleted

## 2016-11-05 NOTE — Telephone Encounter (Signed)
Contacted Steven [name] but had to leave a message. Left a message stating I would love to hear from him and would like to reschedule his visit. Also offered the option of transportation to help him with getting to his appt wi th  [name]

## 2016-11-15 ENCOUNTER — Emergency Department (HOSPITAL_COMMUNITY): Payer: Medicaid Other

## 2016-11-15 ENCOUNTER — Other Ambulatory Visit: Payer: Self-pay

## 2016-11-15 ENCOUNTER — Encounter (HOSPITAL_COMMUNITY): Payer: Self-pay

## 2016-11-15 ENCOUNTER — Emergency Department (HOSPITAL_COMMUNITY)
Admission: EM | Admit: 2016-11-15 | Discharge: 2016-11-15 | Disposition: A | Discharge status: Home / Self Care | Payer: Medicaid Other | Attending: Emergency Medicine | Admitting: Emergency Medicine

## 2016-11-15 DIAGNOSIS — F1290 Cannabis use, unspecified, uncomplicated: Secondary | ICD-10-CM | POA: Diagnosis not present

## 2016-11-15 DIAGNOSIS — F17210 Nicotine dependence, cigarettes, uncomplicated: Secondary | ICD-10-CM | POA: Insufficient documentation

## 2016-11-15 DIAGNOSIS — R509 Fever, unspecified: Secondary | ICD-10-CM

## 2016-11-15 DIAGNOSIS — Z79899 Other long term (current) drug therapy: Secondary | ICD-10-CM | POA: Insufficient documentation

## 2016-11-15 DIAGNOSIS — J020 Streptococcal pharyngitis: Secondary | ICD-10-CM | POA: Diagnosis not present

## 2016-11-15 DIAGNOSIS — R05 Cough: Secondary | ICD-10-CM | POA: Diagnosis present

## 2016-11-15 DIAGNOSIS — R059 Cough, unspecified: Secondary | ICD-10-CM

## 2016-11-15 DIAGNOSIS — Z21 Asymptomatic human immunodeficiency virus [HIV] infection status: Secondary | ICD-10-CM | POA: Insufficient documentation

## 2016-11-15 DIAGNOSIS — F329 Major depressive disorder, single episode, unspecified: Secondary | ICD-10-CM | POA: Insufficient documentation

## 2016-11-15 LAB — RAPID STREP SCREEN (MED CTR MEBANE ONLY): Streptococcus, Group A Screen (Direct): POSITIVE — AB

## 2016-11-15 MED ORDER — DEXAMETHASONE SODIUM PHOSPHATE 10 MG/ML IJ SOLN
10.0000 mg | Freq: Once | INTRAMUSCULAR | Status: AC
  Administered 2016-11-15: 10 mg via INTRAMUSCULAR
  Filled 2016-11-15: qty 1

## 2016-11-15 MED ORDER — HYDROCODONE-ACETAMINOPHEN 7.5-325 MG/15ML PO SOLN: 10.0000 mL | Freq: Four times a day (QID) | ORAL | 0 refills | Status: AC | PRN

## 2016-11-15 MED ORDER — PENICILLIN G BENZATHINE & PROC 1200000 UNIT/2ML IM SUSP
1.2000 10*6.[IU] | Freq: Once | INTRAMUSCULAR | Status: AC
Start: 2016-11-15 — End: 2016-11-15
  Administered 2016-11-15: 1.2 10*6.[IU] via INTRAMUSCULAR
  Filled 2016-11-15: qty 2

## 2016-11-15 NOTE — ED Provider Notes (Signed)
Emergency Department Provider Note   I have reviewed the triage vital signs and the nursing notes.   HISTORY  Chief Complaint sore throat/congestion/cough   HPI Steven [name] is a 26 y.o. male with PMH of HIV and depression presents to the emergency department for evaluation of cough, congestion, runny nose, sore throat over the last 2 weeks.  Patient states that her primary concern is her sore throat. She has severe pain with swallowing and finds it very difficult to take her medications. She states she has been compliant with her HIV medications until 2 weeks ago and the sore throat started.  He finds it painful to even swallow saliva. She has tried over-the-counter pain and cold medications with little to no relief in symptoms. She has tried drinking cool liquids with no relief.  She had fever during the first week of his sore throat but none since. She also reports a nonproductive cough.  No chest pain.  No nausea, vomiting, diarrhea.  No abdominal discomfort. Denies any concern for oral STD.   Past Medical History:  Diagnosis Date  . Depression 07/09/2016  . History of syphilis 07/09/2016  . HIV (human immunodeficiency virus infection) (HCC) 06/29/2016   Dx 06/10/2016  . Kidney stone    kidney stones    Patient Active Problem List   Diagnosis Date Noted  . Exposure to gonorrhea 08/25/2016  . Healthcare maintenance 08/25/2016  . History of syphilis 07/09/2016  . Outbursts of anger 07/09/2016  . HIV (human immunodeficiency virus infection) (HCC) 06/29/2016    History reviewed. No pertinent surgical history.  Current Outpatient Rx  . Order #: [id number] Class: Normal  . Order #: [id number] Class: Print  . Order #: [id number] Class: Normal  . Order #: [id number] Class: Print  . Order #: [id number] Class: Print  . Order #: [id number] Class: Normal  . Order #: [id number] Class: Print  . Order #: [id number] Class: Print  . Order #: [id number] Class: Print    Allergies Patient has no  known allergies.  Family History  Problem Relation Age of Onset  . Mental illness Mother     Social History Social History   Tobacco Use  . Smoking status: Current Some Day Smoker    Packs/day: 0.50    Types: Cigarettes  . Smokeless tobacco: Never Used  Substance Use Topics  . Alcohol use: Yes    Comment: occasional  . Drug use: Yes    Types: Marijuana    Review of Systems  Constitutional: Positive fever/chills Eyes: No visual changes. ENT: Positive sore throat. Cardiovascular: Denies chest pain. Respiratory: Denies shortness of breath. Positive cough.  Gastrointestinal: No abdominal pain.  No nausea, no vomiting.  No diarrhea.  No constipation. Genitourinary: Negative for dysuria. Musculoskeletal: Negative for back pain. Skin: Negative for rash. Neurological: Negative for headaches, focal weakness or numbness.  10-point ROS otherwise negative.  ____________________________________________   PHYSICAL EXAM:  VITAL SIGNS: ED Triage Vitals  Enc Vitals Group     BP 11/15/16 1605 (!) 131/97     Pulse Rate 11/15/16 1605 92     Resp 11/15/16 1605 18     Temp 11/15/16 1605 98.8 F (37.1 C)     Temp Source 11/15/16 1605 Oral     SpO2 11/15/16 1605 96 %     Weight 11/15/16 1604 240 lb (108.9 kg)     Height 11/15/16 1604 5\' 10"  (1.778 m)     Pain Score 11/15/16 1606 10   Constitutional: Alert and oriented. Well appearing and in no acute distress.  Eyes: Conjunctivae are normal. Head: Atraumatic. Nose: No congestion/rhinnorhea.  Mouth/Throat: Mucous membranes are moist.  Oropharynx with erythema and exudate. No PTA. Managing oral secretions. Soft sub-mandibular compartment. Speaking in a clear voice without trismus.  Neck: No stridor.  Cardiovascular: Normal rate, regular rhythm. Good peripheral circulation. Grossly normal heart sounds.   Respiratory: Normal respiratory effort.  No retractions. Lungs CTAB. Gastrointestinal: Soft and nontender. No distention.     Musculoskeletal: No lower extremity tenderness nor edema. No gross deformities of extremities. Neurologic:  Normal speech and language. No gross focal neurologic deficits are appreciated.  Skin:  Skin is warm, dry and intact. No rash noted.  ____________________________________________   LABS (all labs ordered are listed, but only abnormal results are displayed)  Labs Reviewed  RAPID STREP SCREEN (NOT AT [facility]) - Abnormal; Notable for the following components:      Result Value   Streptococcus, Group A Screen (Direct) POSITIVE (*)    All other components within normal limits   ____________________________________________  RADIOLOGY  Dg Chest 2 View  Result Date: 11/15/2016 CLINICAL DATA:  26 y/o  M; 2 weeks of cough. EXAM: CHEST  2 VIEW COMPARISON:  10/16/2015 chest radiograph FINDINGS: Stable heart size and mediastinal contours are within normal limits. Both lungs are clear. The visualized skeletal structures are unremarkable. IMPRESSION: No active cardiopulmonary disease. Electronically Signed   By: Mitzi HansenLance  Furusawa-Stratton M.D.   On: 11/15/2016 17:22    ____________________________________________   PROCEDURES  Procedure(s) performed:   Procedures  None ____________________________________________   INITIAL IMPRESSION / ASSESSMENT AND PLAN / ED COURSE  Pertinent labs & imaging results that were available during my care of the patient were reviewed by me and considered in my medical decision making (see chart for details).  Patient presents the emergency department for evaluation of cough, congestion, runny nose, sore throat over the past 2 weeks. She has been unable to take his antiretroviral medications over that time because of sore throat. She is otherwise undetectable and compliant with medications. She has no evidence on exam to suggest deep space neck infection.  Possible strep throat.  Sent swab.  With patient's HIV status also will obtain chest x-ray given 2  weeks of cough and fever but low suspicion for serious bacterial infection.  No indication for lab work at this time. Suspect URI primarily.   At this time, I do not feel there is any life-threatening condition present. I have reviewed and discussed all results (EKG, imaging, lab, urine as appropriate), exam findings with patient. I have reviewed nursing notes and appropriate previous records.  I feel the patient is safe to be discharged home without further emergent workup. Discussed usual and customary return precautions. Patient and family (if present) verbalize understanding and are comfortable with this plan.  Patient will follow-up with their primary care provider. If they do not have a primary care provider, information for follow-up has been provided to them. All questions have been answered.  ____________________________________________  FINAL CLINICAL IMPRESSION(S) / ED DIAGNOSES  Final diagnoses:  Strep pharyngitis  Cough  Fever, unspecified fever cause     MEDICATIONS GIVEN DURING THIS VISIT:  Medications  penicillin g procaine-penicillin g benzathine (BICILLIN-CR) injection 600000-600000 units (1.2 Million Units Intramuscular Given 11/15/16 1743)  dexamethasone (DECADRON) injection 10 mg (10 mg Intramuscular Given 11/15/16 1744)     NEW OUTPATIENT MEDICATIONS STARTED DURING THIS VISIT:  Hycet   Note:  This document was prepared using Dragon voice recognition software and may include unintentional dictation  errors.  Alona Bene, MD Emergency Medicine    Epiphany Seltzer, Arlyss Repress, MD 11/15/16 2111

## 2016-11-15 NOTE — ED Triage Notes (Signed)
Patient complains of 2 weeks of cough, congestion, runny nose and sore throat, taking otc meds with some relief, NAD

## 2016-11-15 NOTE — Discharge Instructions (Signed)
You were seen in the ED today with strep throat. We treated you with a one-time shot of antibiotics and a shot to decrease throat swelling. I have prescribed a medication for pain to take only as needed. Do not drive while taking this medication. Follow up with your ID doctor and re-start your home medications as soon as possible.

## 2016-11-18 ENCOUNTER — Telehealth: Payer: Self-pay | Admitting: *Deleted

## 2016-11-18 NOTE — Telephone Encounter (Addendum)
RN contacted the patient as an attempt to stay connected/engaged. RN left a message stating that I wanted to be sure all is well and to please let me know if I can assist in any way. Also asked if he was able to receive medications for his newly diagnoses strep throat.   PATIENT ON HOLD  Plan of Care orders have expired effective 11/17/16 but GOALS have not been completely meet at this time. I would like to connect with the patient and received further orders from MD. If I am unable to get in contact with her within the next 60 days I will have to discharge at that time. RN WILL NOT RESUME CARE UNTIL NEW MD ORDERS OBTAINED AND PATIENT ABLE/WILLING TO RE-ENGAGE IN CARE

## 2016-12-17 ENCOUNTER — Ambulatory Visit: Payer: Medicaid Other | Admitting: Infectious Diseases

## 2016-12-17 ENCOUNTER — Telehealth: Payer: Self-pay | Admitting: Pharmacy Technician

## 2016-12-17 NOTE — Telephone Encounter (Signed)
If Mr. Steven [name] comes to his clinic appointment 12/18/16, Jeannette HowBetty Jantz Main in pharmacy needs to speak with him about getting him a refill of [address]

## 2016-12-18 ENCOUNTER — Other Ambulatory Visit (HOSPITAL_COMMUNITY)
Admission: RE | Admit: 2016-12-18 | Discharge: 2016-12-18 | Disposition: A | Discharge status: Home / Self Care | Payer: Medicaid Other | Source: Ambulatory Visit | Attending: Infectious Diseases | Admitting: Infectious Diseases

## 2016-12-18 ENCOUNTER — Encounter: Payer: Self-pay | Admitting: Infectious Diseases

## 2016-12-18 ENCOUNTER — Ambulatory Visit (INDEPENDENT_AMBULATORY_CARE_PROVIDER_SITE_OTHER): Payer: Medicaid Other | Admitting: Infectious Diseases

## 2016-12-18 VITALS — BP 118/80 | HR 106 | Temp 98.4°F | Wt 236.0 lb

## 2016-12-18 DIAGNOSIS — Z113 Encounter for screening for infections with a predominantly sexual mode of transmission: Secondary | ICD-10-CM | POA: Insufficient documentation

## 2016-12-18 DIAGNOSIS — K5900 Constipation, unspecified: Secondary | ICD-10-CM | POA: Insufficient documentation

## 2016-12-18 DIAGNOSIS — Z7251 High risk heterosexual behavior: Secondary | ICD-10-CM | POA: Diagnosis not present

## 2016-12-18 DIAGNOSIS — Z79899 Other long term (current) drug therapy: Secondary | ICD-10-CM | POA: Diagnosis not present

## 2016-12-18 DIAGNOSIS — Z0000 Encounter for general adult medical examination without abnormal findings: Secondary | ICD-10-CM

## 2016-12-18 DIAGNOSIS — Z23 Encounter for immunization: Secondary | ICD-10-CM

## 2016-12-18 DIAGNOSIS — Z21 Asymptomatic human immunodeficiency virus [HIV] infection status: Secondary | ICD-10-CM | POA: Diagnosis not present

## 2016-12-18 DIAGNOSIS — Z87442 Personal history of urinary calculi: Secondary | ICD-10-CM | POA: Insufficient documentation

## 2016-12-18 DIAGNOSIS — J029 Acute pharyngitis, unspecified: Secondary | ICD-10-CM | POA: Insufficient documentation

## 2016-12-18 DIAGNOSIS — R454 Irritability and anger: Secondary | ICD-10-CM | POA: Insufficient documentation

## 2016-12-18 DIAGNOSIS — Z202 Contact with and (suspected) exposure to infections with a predominantly sexual mode of transmission: Secondary | ICD-10-CM

## 2016-12-18 DIAGNOSIS — B20 Human immunodeficiency virus [HIV] disease: Secondary | ICD-10-CM

## 2016-12-18 LAB — T-HELPER CELL (CD4) - (RCID CLINIC ONLY)
CD4 % Helper T Cell: 20 % — ABNORMAL LOW (ref 33–55)
CD4 T Cell Abs: 490 /uL (ref 400–2700)

## 2016-12-18 MED FILL — BIKTARVY 50-200-25 MG TABS: 50-200-25 | 30 days supply | Qty: 30 | Fill #5

## 2016-12-18 NOTE — Assessment & Plan Note (Signed)
No thrush or purulence noted to tonsils/oropharynx. Some erythema noted. Had appropriate tx for strep pharyngitis recently. Will swab for GC/C to see if there is contributing component here. Viral URI likely.

## 2016-12-18 NOTE — Patient Instructions (Addendum)
Fiber gummies, increase your water intake until your urine is clear.   Pick up your Biktarvy - any problems you have with your medications let our team or Ambre know right away. Don't wait until you have been out a few weeks.   Will have our counselor Cordelia PenSherry reach out to you for some talk therapy. Please pick up the medicines [facility]. I think it is important we start to get you feeling better emotionally and mentally.   Please come back to see [name] in 1 month.    Constipation, Adult Constipation is when a person:  Poops (has a bowel movement) fewer times in a week than normal.  Has a hard time pooping.  Has poop that is dry, hard, or bigger than normal.  Follow these instructions at home: Eating and drinking   Eat foods that have a lot of fiber, such as: ? Fresh fruits and vegetables. ? Whole grains. ? Beans.  Eat less of foods that are high in fat, low in fiber, or overly processed, such as: ? [place] fries. ? Hamburgers. ? Cookies. ? Candy. ? Soda.  Drink enough fluid to keep your pee (urine) clear or pale yellow. General instructions  Exercise regularly or as told by your doctor.  Go to the restroom when you feel like you need to poop. Do not hold it in.  Take over-the-counter and prescription medicines only as told by your doctor. These include any fiber supplements.  Do pelvic floor retraining exercises, such as: ? Doing deep breathing while relaxing your lower belly (abdomen). ? Relaxing your pelvic floor while pooping.  Watch your condition for any changes.  Keep all follow-up visits as told by your doctor. This is important. Contact a doctor if:  You have pain that gets worse.  You have a fever.  You have not pooped for 4 days.  You throw up (vomit).  You are not hungry.  You lose weight.  You are bleeding from the anus.  You have thin, pencil-like poop (stool). Get help right away if:  You have a fever, and  your symptoms suddenly get worse.  You leak poop or have blood in your poop.  Your belly feels hard or bigger than normal (is bloated).  You have very bad belly pain.  You feel dizzy or you faint. This information is not intended to replace advice given to you by your health care provider. Make sure you discuss any questions you have with your health care provider. Document Released: 06/10/2007 Document Revised: 07/12/2015 Document Reviewed: 06/12/2015 Elsevier Interactive Patient Education  2017 [employer]

## 2016-12-18 NOTE — Progress Notes (Signed)
[facility] Pharmacy Visit  HPI: Steven [name] is a 26 y.o. male who presents to the [facility] clinic today to follow-up with [name] for his HIV infection.  Patient Active Problem List   Diagnosis Date Noted  . Exposure to gonorrhea 08/25/2016  . Healthcare maintenance 08/25/2016  . History of syphilis 07/09/2016  . Outbursts of anger 07/09/2016  . HIV (human immunodeficiency virus infection) (HCC) 06/29/2016      Medication List        Accurate as of 12/18/16 10:43 AM. Always use your most recent med list.          bictegravir-emtricitabine-tenofovir AF 50-200-25 MG Tabs tablet Commonly known as:  BIKTARVY Take 1 tablet by mouth daily. Try to take at the same time each day with or without food.   cyclobenzaprine 10 MG tablet Commonly known as:  FLEXERIL Take 1 tablet (10 mg total) by mouth at bedtime.   diphenhydrAMINE 25 mg capsule Commonly known as:  BENADRYL Take 1-2 capsules (25-50 mg total) by mouth every 6 (six) hours as needed for itching.   griseofulvin 125 MG tablet Commonly known as:  GRIS-PEG Take 3 tablets (375 mg total) by mouth daily. Take for 6 weeks.   loperamide 2 MG capsule Commonly known as:  IMODIUM Take 1 capsule (2 mg total) by mouth as needed for diarrhea or loose stools.   methylPREDNISolone 4 MG Tbpk tablet Commonly known as:  MEDROL DOSEPAK Take as prescribed   neomycin-polymyxin-hydrocortisone 3.5-10000-1 OTIC suspension Commonly known as:  CORTISPORIN Place 4 drops into the right ear 3 (three) times daily.   ofloxacin 0.3 % OTIC solution Commonly known as:  FLOXIN OTIC Place 5 drops into the right ear daily.       Allergies: No Known Allergies  Past Medical History: Past Medical History:  Diagnosis Date  . Depression 07/09/2016  . History of syphilis 07/09/2016  . HIV (human immunodeficiency virus infection) (HCC) 06/29/2016   Dx 06/10/2016  . Kidney stone    kidney stones    Social  History: Social History   Socioeconomic History  . Marital status: Single    Spouse name: None  . Number of children: None  . Years of education: None  . Highest education level: None  Social Needs  . Financial resource strain: None  . Food insecurity - worry: None  . Food insecurity - inability: None  . Transportation needs - medical: None  . Transportation needs - non-medical: None  Occupational History  . None  Tobacco Use  . Smoking status: Current Some Day Smoker    Packs/day: 0.50    Types: Cigarettes  . Smokeless tobacco: Never Used  Substance and Sexual Activity  . Alcohol use: Yes    Comment: occasional  . Drug use: Yes    Types: Marijuana  . Sexual activity: Yes    Partners: Male    Birth control/protection: Condom  Other Topics Concern  . None  Social History Narrative  . None    Labs: HIV 1 RNA Quant (copies/mL)  Date Value  08/25/2016 113 (H)   CD4 T Cell Abs (/uL)  Date Value  08/25/2016 460  07/09/2016 280 (L)   Hep B S Ab (no units)  Date Value  07/09/2016 NEG   Hepatitis B Surface Ag (no units)  Date Value  07/09/2016 NEGATIVE   HCV Ab (no units)  Date Value  07/09/2016 NEGATIVE    Lipids:    Component Value Date/Time   CHOL  141 08/05/2016 1043   TRIG 33 08/05/2016 1043   HDL 61 08/05/2016 1043   CHOLHDL 2.3 08/05/2016 1043   CHOLHDL 1.9 07/09/2016 1216   VLDL 6 07/09/2016 1216   LDLCALC 73 08/05/2016 1043    [facility]  Assessment: Steven [name] is here today to see [name] to follow-up for his HIV infection.  He was getting his B[facility]tarvy at [facility], and we noticed that the pharmacy, as well as us, have been trying to get in touch with him for ~3 weeks for a refill with no luck. After talking with the patient, he got a new phone number and did not tell us or call us for refills or to let us know.  I told him to make sure he did not do this again and to make sure he did not run out of medications.  Steven RhodesBetty  gave him her card and we told him to call the number on his Biktarvy bottle if he does not hear from us before he runs out again.  He verbalized understanding.   Plan: - Continue Biktarvy PO once daily for now - F/u next month for refills with pharmacy   Sincere Berlanga L. Deshannon Hinchliffe, PharmD, AAHIVP, CPP Infectious Diseases Clinical Pharmacist [facility] 12/18/2016, 10:43 AM
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
## 2016-12-18 NOTE — Assessment & Plan Note (Signed)
Counseled on modifying behaviors to increase fiber/water intake. Advised to try OTC fiber gummies or stool softeners as needed to achieve BM. Advised to call should he have pain or go longer than 1 week for evaluation. Resuming Biktarvy may mediate some of this for him as the symptoms he describes match up with when he stopped Biktarvy.

## 2016-12-18 NOTE — Assessment & Plan Note (Signed)
Flu shot today. HPV series to start when he returns
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
## 2016-12-18 NOTE — Assessment & Plan Note (Signed)
High risk sexual behavior - screen with oral/rectal and urine GC/C as well as RPR. Provided condoms today. Counseled about safe sex.

## 2016-12-18 NOTE — Assessment & Plan Note (Signed)
Met with our pharmacy team today for access and medication counseling. Continue Biktarvy. Counseled on safe sex practices to prevent transmission. Will check VL today to use as teaching point to see impact of not taking medications. Return in 1 month. Provided with condoms today.  

## 2016-12-21 LAB — RPR: RPR Ser Ql: REACTIVE — AB

## 2016-12-21 LAB — CYTOLOGY, (ORAL, ANAL, URETHRAL) ANCILLARY ONLY
Chlamydia: NEGATIVE
Chlamydia: NEGATIVE
Neisseria Gonorrhea: NEGATIVE
Neisseria Gonorrhea: NEGATIVE

## 2016-12-21 LAB — FLUORESCENT TREPONEMAL AB(FTA)-IGG-BLD: Fluorescent Treponemal ABS: REACTIVE — AB

## 2016-12-21 LAB — URINE CYTOLOGY ANCILLARY ONLY
Chlamydia: NEGATIVE
Neisseria Gonorrhea: NEGATIVE

## 2016-12-21 LAB — RPR TITER: RPR Titer: 1:1 {titer} — ABNORMAL HIGH

## 2016-12-22 ENCOUNTER — Telehealth: Payer: Self-pay | Admitting: *Deleted

## 2016-12-22 LAB — HIV-1 RNA QUANT-NO REFLEX-BLD
HIV 1 RNA Quant: 192 copies/mL — ABNORMAL HIGH
HIV-1 RNA Quant, Log: 2.28 Log copies/mL — ABNORMAL HIGH

## 2016-12-23 ENCOUNTER — Ambulatory Visit (INDEPENDENT_AMBULATORY_CARE_PROVIDER_SITE_OTHER): Payer: Medicaid Other | Admitting: *Deleted

## 2016-12-23 DIAGNOSIS — Z113 Encounter for screening for infections with a predominantly sexual mode of transmission: Secondary | ICD-10-CM | POA: Diagnosis not present

## 2016-12-23 MED ORDER — PENICILLIN G BENZATHINE 1200000 UNIT/2ML IM SUSP
1.2000 10*6.[IU] | Freq: Once | INTRAMUSCULAR | Status: AC
  Administered 2016-12-23: 1.2 10*6.[IU] via INTRAMUSCULAR

## 2017-01-11 ENCOUNTER — Other Ambulatory Visit: Payer: Self-pay | Admitting: Infectious Diseases

## 2017-01-11 DIAGNOSIS — B20 Human immunodeficiency virus [HIV] disease: Secondary | ICD-10-CM

## 2017-01-14 ENCOUNTER — Telehealth: Payer: Self-pay | Admitting: *Deleted

## 2017-01-14 NOTE — Telephone Encounter (Signed)
Contacted Steven [name] today and he stated he will be returning from [place] on Friday. I advised him to drive safely and to remember that he has an upcoming appt with [name] on the 14th at 11:15am

## 2017-01-14 NOTE — Telephone Encounter (Signed)
[facility] to [facility] by phone so I set him a message stating   "Hey can you come in for another visit please"    Received message back stating "Yes is everything ok, I'm just waking up and should I be scared worried u can tell me my friend"  I called Oddis again and informed him that his syphilis test came back positive and we would like to get him treated ASAP. Offered him the 1st available appt for tomorrow at 9:45. Jamair accepted the time stating he has transportation so that will be good.   Triamaine went on to say he felt like he may need treatment for his drug abuse stating he is tried of living like this. Ronne Binningrimaine states he struggles with weed and cocaine use along with a high sex drive.    At this time I [facility].   Trimained declined treatment stating he would not be back in town from [place] until the beginning of January.   Confirmed that he will be able to make his appt tomorrow for treatment.

## 2017-01-15 ENCOUNTER — Telehealth: Payer: Self-pay | Admitting: *Deleted

## 2017-01-15 MED FILL — BIKTARVY 50-200-25 MG TABS: 50-200-25 | 30 days supply | Qty: 30 | Fill #0

## 2017-01-18 ENCOUNTER — Ambulatory Visit: Payer: Medicaid Other | Admitting: Infectious Diseases

## 2017-01-28 ENCOUNTER — Other Ambulatory Visit (HOSPITAL_COMMUNITY)
Admission: RE | Admit: 2017-01-28 | Discharge: 2017-01-28 | Disposition: A | Discharge status: Home / Self Care | Payer: Medicaid Other | Source: Ambulatory Visit | Attending: Infectious Diseases | Admitting: Infectious Diseases

## 2017-01-28 ENCOUNTER — Ambulatory Visit (INDEPENDENT_AMBULATORY_CARE_PROVIDER_SITE_OTHER): Payer: Medicaid Other | Admitting: Pharmacist Clinician (PhC)/ Clinical Pharmacy Specialist

## 2017-01-28 DIAGNOSIS — Z23 Encounter for immunization: Secondary | ICD-10-CM | POA: Diagnosis not present

## 2017-01-28 DIAGNOSIS — B20 Human immunodeficiency virus [HIV] disease: Secondary | ICD-10-CM | POA: Diagnosis not present

## 2017-01-28 NOTE — Progress Notes (Signed)
HPI: Steven [name] is a 27 y.o. male who is here to f/u with pharmacy for his HIV.   Allergies: No Known Allergies  Vitals:    Past Medical History: Past Medical History:  Diagnosis Date  . Depression 07/09/2016  . History of syphilis 07/09/2016  . HIV (human immunodeficiency virus infection) ([facility]) 06/29/2016   Dx 06/10/2016  . Kidney stone    kidney stones    Social History: Social History   Socioeconomic History  . Marital status: Single    Spouse name: Not on file  . Number of children: Not on file  . Years of education: Not on file  . Highest education level: Not on file  Social Needs  . Financial resource strain: Not on file  . Food insecurity - worry: Not on file  . Food insecurity - inability: Not on file  . Transportation needs - medical: Not on file  . Transportation needs - non-medical: Not on file  Occupational History  . Not on file  Tobacco Use  . Smoking status: Current Some Day Smoker    Packs/day: 0.50    Types: Cigarettes  . Smokeless tobacco: Never Used  Substance and Sexual Activity  . Alcohol use: Yes    Comment: occasional  . Drug use: Yes    Types: Marijuana  . Sexual activity: Yes    Partners: Male    Birth control/protection: Condom  Other Topics Concern  . Not on file  Social History Narrative  . Not on file    Previous Regimen: None  Current Regimen: Biktarvy  Labs: HIV 1 RNA Quant (copies/mL)  Date Value  12/18/2016 192 (H)  08/25/2016 113 (H)   CD4 T Cell Abs (/uL)  Date Value  12/18/2016 490  08/25/2016 460  07/09/2016 280 (L)   Hep B S Ab (no units)  Date Value  07/09/2016 NEG   Hepatitis B Surface Ag (no units)  Date Value  07/09/2016 NEGATIVE   HCV Ab (no units)  Date Value  07/09/2016 NEGATIVE    CrCl: CrCl cannot be calculated (Patient's most recent lab result is older than the maximum 21 days allowed.).  Lipids:    Component Value Date/Time   CHOL 141 08/05/2016 1043   TRIG 33 08/05/2016 1043    HDL 61 08/05/2016 1043   CHOLHDL 2.3 08/05/2016 1043   CHOLHDL 1.9 07/09/2016 1216   VLDL 6 07/09/2016 1216   LDLCALC 73 08/05/2016 1043    Assessment: Steven [name] missed the last appt with Steven [name] last week. He is a newly dx HIV patient that was started on Biktarvy. He missed some doses previously due to phone issue. He admitted at this visit that he has been taking it regularly. He has had 2 sexual encounter since the last visit. He was treated for syphilis in Dec. He was previously positive for gonorrhea in Aug. Therefore, I'm going to re-swab him again.   We are going to repeat his VL today. Start him on the Gardasil series. He got the first shot today. Set him up with nurse visits to get his second and third dose.   Recommendations:  Cont Biktarvy 1 PO qday HIV labs today STD swabs today F/u with Steven [name] in Feb Gardasil 9 today, March and July  Steven [name], PharmD, BCPS, AAHIVP, CPP Clinical Infectious Disease Pharmacist [facility] 01/28/2017, 4:22 PM

## 2017-01-29 LAB — RPR: RPR Ser Ql: NONREACTIVE

## 2017-02-01 LAB — URINE CYTOLOGY ANCILLARY ONLY
Chlamydia: NEGATIVE
Neisseria Gonorrhea: NEGATIVE

## 2017-02-02 LAB — HIV-1 RNA QUANT-NO REFLEX-BLD
HIV 1 RNA Quant: 2690 copies/mL — ABNORMAL HIGH
HIV-1 RNA Quant, Log: 3.43 Log copies/mL — ABNORMAL HIGH

## 2017-02-02 NOTE — Progress Notes (Signed)
Sherry and I have also spoke and I hope he will engage with her as well! Keeping my fingers crossed!

## 2017-02-05 ENCOUNTER — Ambulatory Visit: Payer: Medicaid Other

## 2017-02-08 MED FILL — BIKTARVY 50-200-25 MG TABS: 50-200-25 | 30 days supply | Qty: 30 | Fill #1

## 2017-02-11 ENCOUNTER — Ambulatory Visit: Payer: Medicaid Other | Admitting: Infectious Diseases

## 2017-02-22 NOTE — Telephone Encounter (Signed)
The intent of this communication is to inform the Health Care Team that this patient will be discharged from [facility]  Greater than 3 attempts have been made to re-engage the patient  without any success .Moving forward, the [facility] will be willing to reopen the patient to services if and when the patient is ready to discuss medication adherence and HIV disease  management. Effective 01/15/17 patient will be discharged and [facility] active [facility].   I would love to continue to [facility] but currently his is in [place] and I cannot see him. I will continue to offer assistance to [facility] in hopes that he will consistently engage in care.

## 2017-02-23 NOTE — Telephone Encounter (Signed)
I will try to give him a call today this afternoon also to reinforce a bit too. It has been a while since I have talked to him. Thank you!

## 2017-02-23 NOTE — Telephone Encounter (Signed)
Thank you for letting me know, Ambre. Do you know if he is in care in [place] area?

## 2017-02-23 NOTE — Telephone Encounter (Signed)
I don't think he is. I think he is just struggling to deal with his health needs. He came back from [place] but is still avoiding care. I have to place the discharge per policy but I don't want to give up on him.

## 2017-03-02 MED FILL — BIKTARVY 50-200-25 MG TABS: 50-200-25 | 30 days supply | Qty: 30 | Fill #2

## 2017-03-05 ENCOUNTER — Other Ambulatory Visit: Payer: Self-pay | Admitting: Pharmacist

## 2017-03-30 ENCOUNTER — Ambulatory Visit: Payer: Medicaid Other

## 2017-04-01 ENCOUNTER — Other Ambulatory Visit: Payer: Self-pay | Admitting: Pharmacist

## 2017-04-01 MED FILL — BIKTARVY 50-200-25 MG TABS: 50-200-25 | 30 days supply | Qty: 30 | Fill #3

## 2017-04-14 ENCOUNTER — Telehealth: Payer: Self-pay | Admitting: *Deleted

## 2017-04-14 NOTE — Telephone Encounter (Signed)
Contacted Donielle today and left a message stating I would love to know how he is doing and if I can assist him in anyway

## 2017-04-26 MED FILL — BIKTARVY 50-200-25 MG TABS: 50-200-25 | 30 days supply | Qty: 30 | Fill #4

## 2017-04-29 ENCOUNTER — Telehealth: Payer: Self-pay | Admitting: *Deleted

## 2017-04-30 NOTE — Telephone Encounter (Addendum)
Contacted the patient today via text message stating " Hey, I haven't seen or talked to you in a while so I was just checking in with you"  I received a reply back stating "This phone number is no longer in service"  To check to see if the message was automated through the phone provider  I sent another text stating "I never knew the phone company responds via text to say the number is no longer in service". If the message is automated I will receive the same message again.  I immediately received a message back that stated "STOP" so I assume an actual person is sending the text message and so I replied by stating "Understood, take care"  I will no longer attempt to engage with Steven [name] unless he reaches out to me for assistance

## 2017-04-30 NOTE — Telephone Encounter (Signed)
Oh no so sad to hear this. I hope Ronne Binningrimaine reaches out or finds care wherever he may be.

## 2017-05-20 ENCOUNTER — Telehealth: Payer: Self-pay | Admitting: Pharmacist

## 2017-05-20 NOTE — Telephone Encounter (Signed)
Patient has been MIA from the clinic for several months.  I have tried to contact him multiple times with no luck.  He has no showed 4-5 times.   [facility] did speak with patient as he called and Steven [name], pharmacist at [facility], told me that patient told one of their techs that he was taking 2 Biktarvy pills some days and not taking any other days. I had Steven [name] cancel the prescription and asked her not to send anymore Steven [name] out until we can get patient back in clinic.    Will route to [place] to see if she can reach out to patient and talk with him.  The new number for patient is [phone]. Will update in epic and also route to Ambre to reach back out.

## 2017-05-24 ENCOUNTER — Telehealth: Payer: Self-pay | Admitting: Pharmacist Clinician (PhC)/ Clinical Pharmacy Specialist

## 2017-05-24 NOTE — Telephone Encounter (Signed)
He has missed several appts and his adherence has not been great. Missing doses and double up doses. Going to bring him in for labs next week and adherence.

## 2017-06-02 ENCOUNTER — Ambulatory Visit: Payer: Medicaid Other

## 2017-06-02 ENCOUNTER — Telehealth: Payer: Self-pay | Admitting: Pharmacist

## 2017-06-02 NOTE — Telephone Encounter (Signed)
Patient no showed again today for his follow-up and hasn't been seen since December 2018. When we last heard, he was taking his Biktarvy incorrectly - some days 2 pills at once and some days none at all.  We have told [facility] to stop filling his Biktarvy until we can get him back into care.

## 2017-06-07 NOTE — Telephone Encounter (Signed)
Thank you - I agree that until he can come in to ensure we can counsel on proper use and safety of medication it would be best to not fill prescription. I worry about not only resistance but also organ dysfunction/damage from improper use.

## 2017-06-14 NOTE — Telephone Encounter (Signed)
I have been unable to reach Advancerimaine. Hopefully he will call back and we can see him again for care.

## 2017-06-18 ENCOUNTER — Encounter (HOSPITAL_COMMUNITY): Payer: Self-pay | Admitting: Family Medicine

## 2017-06-18 ENCOUNTER — Ambulatory Visit (HOSPITAL_COMMUNITY)
Admission: EM | Admit: 2017-06-18 | Discharge: 2017-06-18 | Disposition: A | Discharge status: Home / Self Care | Payer: Medicaid Other | Attending: Family Medicine | Admitting: Family Medicine

## 2017-06-18 DIAGNOSIS — N200 Calculus of kidney: Secondary | ICD-10-CM

## 2017-06-18 DIAGNOSIS — R1032 Left lower quadrant pain: Secondary | ICD-10-CM

## 2017-06-18 LAB — POCT URINALYSIS DIP (DEVICE)
Bilirubin Urine: NEGATIVE
Glucose, UA: NEGATIVE mg/dL
Ketones, ur: 40 mg/dL — AB
Leukocytes, UA: NEGATIVE
Nitrite: NEGATIVE
Protein, ur: 30 mg/dL — AB
Specific Gravity, Urine: >=1.03 (ref 1.005–1.030)
Urobilinogen, UA: 0.2 mg/dL (ref 0.0–1.0)
pH: 5.5 (ref 5.0–8.0)

## 2017-06-18 MED ORDER — ONDANSETRON HCL 4 MG PO TABS: 4.0000 mg | ORAL_TABLET | Freq: Four times a day (QID) | ORAL | 0 refills | Status: DC | PRN

## 2017-06-18 MED ORDER — ONDANSETRON 4 MG PO TBDP
ORAL_TABLET | ORAL | Status: AC
  Filled 2017-06-18: qty 1

## 2017-06-18 MED ORDER — TAMSULOSIN HCL 0.4 MG PO CAPS: 0.4000 mg | ORAL_CAPSULE | Freq: Every day | ORAL | 0 refills | Status: DC

## 2017-06-18 MED ORDER — KETOROLAC TROMETHAMINE 60 MG/2ML IM SOLN
60.0000 mg | Freq: Once | INTRAMUSCULAR | Status: AC
  Administered 2017-06-18: 60 mg via INTRAMUSCULAR

## 2017-06-18 MED ORDER — INDOMETHACIN 50 MG PO CAPS: 50.0000 mg | ORAL_CAPSULE | Freq: Two times a day (BID) | ORAL | 0 refills | Status: DC | PRN

## 2017-06-18 MED ORDER — KETOROLAC TROMETHAMINE 60 MG/2ML IM SOLN
INTRAMUSCULAR | Status: AC
  Filled 2017-06-18: qty 2

## 2017-06-18 MED ORDER — ONDANSETRON 4 MG PO TBDP
4.0000 mg | ORAL_TABLET | Freq: Once | ORAL | Status: AC
Start: 2017-06-18 — End: 2017-06-18
  Administered 2017-06-18: 4 mg via ORAL

## 2017-06-18 NOTE — ED Triage Notes (Addendum)
Pt with hx of kidney stones here for severe LLQ pain and vomiting, sharp pain since this am. He denies diarrhea, blood in urine. Denies dysuria or penile discharge.

## 2017-06-18 NOTE — Discharge Instructions (Addendum)
Toradol shot given in office Prescribed indomethacin and flomax Take as prescribed and to completion Prescribed zofran as needed for nausea and vomiting Push fluids and get plenty of rest Strainer given Follow up with PCP if symptoms persists Return or go to the ER if you have any new or worsening symptoms

## 2017-06-18 NOTE — ED Provider Notes (Signed)
[facility]   [id number] 06/18/17 Arrival Time: 1255  SUBJECTIVE:  Steven [name] is a 27 y.o. male hx significant for HIV who presents with complaint of abdominal discomfort that began abruptly this morning.  Denies a precipitating event, or trauma.  Localizes pain to LLQ.  Describes as constant and worsening that is sharp in character.  Has not tried OTC medications.  Denies alleviating factors.  His symptoms are made worse with movement. Reports similar symptoms in the past and diagnosed with kidney stones.  Last BM last night and looser.  Complains of subjective fever, chills, decreased appetite, nausea, vomited x3-4, dysuria, and decreased urinary frequency.    Denies weight changes, chest pain, constipation, hematochezia, melena, dysuria, difficulty urinating, increased frequency or urgency, flank pain, loss of bowel or bladder function.  No LMP for male patient.  ROS: As per HPI.  Past Medical History:  Diagnosis Date  . Depression 07/09/2016  . History of syphilis 07/09/2016  . HIV (human immunodeficiency virus infection) (HCC) 06/29/2016   Dx 06/10/2016  . Kidney stone    kidney stones   History reviewed. No pertinent surgical history. No Known Allergies No current facility-administered medications on file prior to encounter.    Current Outpatient Medications on File Prior to Encounter  Medication Sig Dispense Refill  . BIKTARVY 50-200-25 MG TABS tablet TAKE 1 TABLET BY MOUTH DAILY. TRY TO TAKE AT THE SAME TIME EACH DAY WITH OR WITHOUT FOOD. 30 tablet 5   Social History   Socioeconomic History  . Marital status: Single    Spouse name: Not on file  . Number of children: Not on file  . Years of education: Not on file  . Highest education level: Not on file  Occupational History  . Not on file  Social Needs  . Financial resource strain: Not on file  . Food insecurity:    Worry: Not on file    Inability: Not on file  . Transportation needs:    Medical: Not  on file    Non-medical: Not on file  Tobacco Use  . Smoking status: Current Some Day Smoker    Packs/day: 0.50    Types: Cigarettes  . Smokeless tobacco: Never Used  Substance and Sexual Activity  . Alcohol use: Yes    Comment: occasional  . Drug use: Yes    Types: Marijuana  . Sexual activity: Yes    Partners: Male    Birth control/protection: Condom  Lifestyle  . Physical activity:    Days per week: Not on file    Minutes per session: Not on file  . Stress: Not on file  Relationships  . Social connections:    Talks on phone: Not on file    Gets together: Not on file    Attends religious service: Not on file    Active member of club or organization: Not on file    Attends meetings of clubs or organizations: Not on file    Relationship status: Not on file  . Intimate partner violence:    Fear of current or ex partner: Not on file    Emotionally abused: Not on file    Physically abused: Not on file    Forced sexual activity: Not on file  Other Topics Concern  . Not on file  Social History Narrative  . Not on file   Family History  Problem Relation Age of Onset  . Mental illness Mother      OBJECTIVE:  Vitals:  06/18/17 1317  BP: 118/68  Pulse: 60  Resp: 18  Temp: 98 F (36.7 C)  SpO2: 98%    General appearance: AOx3; appears uncomfortable, changing positions frequently HEENT: NCAT.  Oropharynx clear.  Lungs: clear to auscultation bilaterally without adventitious breath sounds Heart: regular rate and rhythm.  Radial pulses 2+ symmetrical bilaterally Abdomen: soft, non-distended; normal active bowel sounds; tender to palpation about the LLQ; nontender at McBurney's point; negative rebound; guarding Back: left sided CVA tenderness Extremities: no edema; symmetrical with no gross deformities Skin: warm and dry Neurologic: normal gait Psychological: alert and cooperative; normal mood and affect  Labs: Results for orders placed or performed during the  hospital encounter of 06/18/17 (from the past 24 hour(s))  POCT urinalysis dip (device)     Status: Abnormal   Collection Time: 06/18/17  2:31 PM  Result Value Ref Range   Glucose, UA NEGATIVE NEGATIVE mg/dL   Bilirubin Urine NEGATIVE NEGATIVE   Ketones, ur 40 (A) NEGATIVE mg/dL   Specific Gravity, Urine >=1.030 1.005 - 1.030   Hgb urine dipstick LARGE (A) NEGATIVE   pH 5.5 5.0 - 8.0   Protein, ur 30 (A) NEGATIVE mg/dL   Urobilinogen, UA 0.2 0.0 - 1.0 mg/dL   Nitrite NEGATIVE NEGATIVE   Leukocytes, UA NEGATIVE NEGATIVE     ASSESSMENT & PLAN:  1. Abdominal pain, left lower quadrant   2. Nephrolithiasis     Meds ordered this encounter  Medications  . ondansetron (ZOFRAN-ODT) disintegrating tablet 4 mg  . ketorolac (TORADOL) injection 60 mg  . indomethacin (INDOCIN) 50 MG capsule    Sig: Take 1 capsule (50 mg total) by mouth 2 (two) times daily as needed for moderate pain.    Dispense:  20 capsule    Refill:  0    Order Specific Question:   Supervising Provider    Answer:   Isa RankinMURRAY, LAURA WILSON [phone][[id number]]  . tamsulosin (FLOMAX) 0.4 MG CAPS capsule    Sig: Take 1 capsule (0.4 mg total) by mouth daily.    Dispense:  30 capsule    Refill:  0    Order Specific Question:   Supervising Provider    Answer:   Isa RankinMURRAY, LAURA WILSON [phone][[id number]]  . ondansetron (ZOFRAN) 4 MG tablet    Sig: Take 1 tablet (4 mg total) by mouth every 6 (six) hours as needed for nausea or vomiting.    Dispense:  12 tablet    Refill:  0    Order Specific Question:   Supervising Provider    Answer:   Isa RankinMURRAY, LAURA WILSON [[id number]]   Toradol shot given in office Prescribed indomethacin and flomax Take as prescribed and to completion Prescribed zofran as needed for nausea and vomiting Push fluids and get plenty of rest Strainer given.  Patient will follow up with PCP Follow up with PCP if symptoms persists Return or go to the ER if you have any new or worsening symptoms  Reviewed expectations re: course of  current medical issues. Questions answered. Outlined signs and symptoms indicating need for more acute intervention. Patient verbalized understanding. After Visit Summary given.   Rennis HardingWurst, Sneijder Bernards, PA-C 06/18/17 1517

## 2017-06-19 ENCOUNTER — Telehealth (HOSPITAL_COMMUNITY): Payer: Self-pay | Admitting: *Deleted

## 2017-06-19 MED ORDER — ONDANSETRON HCL 4 MG PO TABS: 4.0000 mg | ORAL_TABLET | Freq: Four times a day (QID) | ORAL | 0 refills | Status: DC | PRN

## 2017-06-19 MED ORDER — INDOMETHACIN 50 MG PO CAPS: 50.0000 mg | ORAL_CAPSULE | Freq: Two times a day (BID) | ORAL | 0 refills | Status: DC | PRN

## 2017-06-19 MED ORDER — TAMSULOSIN HCL 0.4 MG PO CAPS: 0.4000 mg | ORAL_CAPSULE | Freq: Every day | ORAL | 0 refills | Status: DC

## 2017-06-19 NOTE — Telephone Encounter (Signed)
Pt requesting Rxs from 06/18/17 be changed to [facility] on [address].

## 2017-06-23 ENCOUNTER — Encounter (HOSPITAL_COMMUNITY): Payer: Self-pay | Admitting: *Deleted

## 2017-06-23 ENCOUNTER — Emergency Department (HOSPITAL_COMMUNITY): Payer: Medicaid Other

## 2017-06-23 ENCOUNTER — Emergency Department (HOSPITAL_COMMUNITY)
Admission: EM | Admit: 2017-06-23 | Discharge: 2017-06-24 | Disposition: A | Discharge status: Home / Self Care | Payer: Medicaid Other | Attending: Emergency Medicine | Admitting: Emergency Medicine

## 2017-06-23 ENCOUNTER — Other Ambulatory Visit: Payer: Self-pay

## 2017-06-23 DIAGNOSIS — F17210 Nicotine dependence, cigarettes, uncomplicated: Secondary | ICD-10-CM | POA: Diagnosis not present

## 2017-06-23 DIAGNOSIS — Z79899 Other long term (current) drug therapy: Secondary | ICD-10-CM | POA: Diagnosis not present

## 2017-06-23 DIAGNOSIS — R319 Hematuria, unspecified: Secondary | ICD-10-CM

## 2017-06-23 DIAGNOSIS — R1032 Left lower quadrant pain: Secondary | ICD-10-CM | POA: Diagnosis present

## 2017-06-23 DIAGNOSIS — N201 Calculus of ureter: Secondary | ICD-10-CM | POA: Insufficient documentation

## 2017-06-23 DIAGNOSIS — B20 Human immunodeficiency virus [HIV] disease: Secondary | ICD-10-CM | POA: Insufficient documentation

## 2017-06-23 LAB — URINALYSIS, ROUTINE W REFLEX MICROSCOPIC
Bacteria, UA: NONE SEEN
Bilirubin Urine: NEGATIVE
Glucose, UA: NEGATIVE mg/dL
Ketones, ur: NEGATIVE mg/dL
Leukocytes, UA: NEGATIVE
Nitrite: NEGATIVE
Protein, ur: NEGATIVE mg/dL
Specific Gravity, Urine: 1.024 (ref 1.005–1.030)
pH: 6 (ref 5.0–8.0)

## 2017-06-23 MED ORDER — ONDANSETRON HCL 4 MG/2ML IJ SOLN
4.0000 mg | Freq: Once | INTRAMUSCULAR | Status: AC
  Administered 2017-06-24: 4 mg via INTRAVENOUS
  Filled 2017-06-23: qty 2

## 2017-06-23 MED ORDER — KETOROLAC TROMETHAMINE 30 MG/ML IJ SOLN
30.0000 mg | Freq: Once | INTRAMUSCULAR | Status: AC
  Administered 2017-06-24: 30 mg via INTRAVENOUS
  Filled 2017-06-23: qty 1

## 2017-06-23 MED ORDER — HYDROMORPHONE HCL 2 MG/ML IJ SOLN
0.5000 mg | Freq: Once | INTRAMUSCULAR | Status: AC
  Administered 2017-06-24: 0.5 mg via INTRAVENOUS
  Filled 2017-06-23: qty 1

## 2017-06-23 NOTE — ED Triage Notes (Signed)
Pt having LLQ pain since last week, [facility] which pt has taken all of. C/o worsening pain, hx of kidney stones, pain feels similar to pain with previous stones

## 2017-06-23 NOTE — ED Notes (Signed)
Pt ambulated to room with no acute distress.

## 2017-06-23 NOTE — ED Provider Notes (Signed)
[facility] Provider Note   CSN: [id number] Arrival date & time: 06/23/17  2313     History   Chief Complaint Chief Complaint  Patient presents with  . Nephrolithiasis    HPI Steven [name] is a 27 y.o. male.  The history is provided by the patient and medical records.     27 year old male with history of depression, HIV, kidney stones, presenting to the ED for left lower abdominal pain.  Reports this initially began last week, sudden onset.  Reports some associated nausea and vomiting.  Does report some mild dysuria, no penile discharge.  No hematuria.  States has history of multiple kidney stones in the past with similar symptoms.  All his stones in the past have been able to pass spontaneously.  He denies any fever or chills.  He was seen at urgent care last week and prescribed indomethacin as well as Flomax which she has been taking as directed without much relief.  Past Medical History:  Diagnosis Date  . Depression 07/09/2016  . History of syphilis 07/09/2016  . HIV (human immunodeficiency virus infection) (HCC) 06/29/2016   Dx 06/10/2016  . Kidney stone    kidney stones    Patient Active Problem List   Diagnosis Date Noted  . Constipation 12/18/2016  . Sore throat 12/18/2016  . Exposure to gonorrhea 08/25/2016  . Healthcare maintenance 08/25/2016  . History of syphilis 07/09/2016  . Outbursts of anger 07/09/2016  . HIV (human immunodeficiency virus infection) (HCC) 06/29/2016    History reviewed. No pertinent surgical history.      Home Medications    Prior to Admission medications   Medication Sig Start Date End Date Taking? Authorizing Provider  BIKTARVY 50-200-25 MG TABS tablet TAKE 1 TABLET BY MOUTH DAILY. TRY TO TAKE AT THE SAME TIME EACH DAY WITH OR WITHOUT FOOD. 01/11/17   Blanchard Kelchixon, [name] N, NP  indomethacin (INDOCIN) 50 MG capsule Take 1 capsule (50 mg total) by mouth 2 (two) times daily as needed for moderate  pain. 06/19/17   Wurst, [place], PA-C  ondansetron (ZOFRAN) 4 MG tablet Take 1 tablet (4 mg total) by mouth every 6 (six) hours as needed for nausea or vomiting. 06/19/17   Wurst, [place], PA-C  tamsulosin (FLOMAX) 0.4 MG CAPS capsule Take 1 capsule (0.4 mg total) by mouth daily. 06/19/17   Rennis HardingWurst, Brittany, PA-C    Family History Family History  Problem Relation Age of Onset  . Mental illness Mother     Social History Social History   Tobacco Use  . Smoking status: Current Some Day Smoker    Packs/day: 0.50    Types: Cigarettes  . Smokeless tobacco: Never Used  Substance Use Topics  . Alcohol use: Yes    Comment: occasional  . Drug use: Yes    Types: Marijuana     Allergies   Patient has no known allergies.   Review of Systems Review of Systems  Gastrointestinal: Positive for abdominal pain.  Genitourinary: Positive for dysuria.  All other systems reviewed and are negative.    Physical Exam Updated Vital Signs BP 131/84 (BP Location: Right Arm)   Pulse 72   Temp 98.2 F (36.8 C) (Oral)   Resp 15   SpO2 96%   Physical Exam  Constitutional: He is oriented to person, place, and time. He appears well-developed and well-nourished.  HENT:  Head: Normocephalic and atraumatic.  Mouth/Throat: Oropharynx is clear and moist.  Eyes: Pupils are equal, round, and  reactive to light. Conjunctivae and EOM are normal.  Neck: Normal range of motion.  Cardiovascular: Normal rate, regular rhythm and normal heart sounds.  Pulmonary/Chest: Effort normal and breath sounds normal. No stridor. No respiratory distress.  Abdominal: Soft. Bowel sounds are normal. There is tenderness in the left lower quadrant. There is no rebound.    Musculoskeletal: Normal range of motion.  Neurological: He is alert and oriented to person, place, and time.  Skin: Skin is warm and dry.  Psychiatric: He has a normal mood and affect.  Nursing note and vitals reviewed.    ED Treatments / Results    Labs (all labs ordered are listed, but only abnormal results are displayed) Labs Reviewed  URINALYSIS, ROUTINE W REFLEX MICROSCOPIC - Abnormal; Notable for the following components:      Result Value   Hgb urine dipstick MODERATE (*)    All other components within normal limits  CBC WITH DIFFERENTIAL/PLATELET - Abnormal; Notable for the following components:   Hemoglobin 12.9 (*)    HCT 38.1 (*)    All other components within normal limits  BASIC METABOLIC PANEL - Abnormal; Notable for the following components:   Creatinine, Ser 1.32 (*)    All other components within normal limits    EKG None  Radiology Ct Renal Stone Study  Result Date: 06/24/2017 CLINICAL DATA:  Left-sided abdominal pain for 1 week. Previous history of kidney stones. EXAM: CT ABDOMEN AND PELVIS WITHOUT CONTRAST TECHNIQUE: Multidetector CT imaging of the abdomen and pelvis was performed following the standard protocol without IV contrast. COMPARISON:  01/24/2015 FINDINGS: Lower chest: Mild dependent changes in the lung bases. Hepatobiliary: No focal liver abnormality is seen. No gallstones, gallbladder wall thickening, or biliary dilatation. Pancreas: Unremarkable. No pancreatic ductal dilatation or surrounding inflammatory changes. Spleen: Normal in size without focal abnormality. Adrenals/Urinary Tract: No adrenal gland nodules. Left renal hydronephrosis and hydroureter with a 4 mm stone in the mid left ureter at the level of L3-4. Distal left ureter is decompressed. Right kidney and ureter appear normal. Bladder is decompressed. No bladder stones identified. Stomach/Bowel: Stomach is within normal limits. Appendix appears normal. No evidence of bowel wall thickening, distention, or inflammatory changes. Vascular/Lymphatic: No significant vascular findings are present. No enlarged abdominal or pelvic lymph nodes. Reproductive: Prostate gland is not enlarged. Other: No abdominal wall hernia or abnormality. No  abdominopelvic ascites. Musculoskeletal: No acute or significant osseous findings. IMPRESSION: 1. 4 mm stone in the mid left ureter with moderate proximal obstruction. 2. No other acute process demonstrated in the abdomen or pelvis on noncontrast imaging. Electronically Signed   By: Burman Nieves M.D.   On: 06/24/2017 00:19    Procedures Procedures (including critical care time)  Medications Ordered in ED Medications  ketorolac (TORADOL) 30 MG/ML injection 30 mg (has no administration in time range)  HYDROmorphone (DILAUDID) injection 0.5 mg (has no administration in time range)  ondansetron (ZOFRAN) injection 4 mg (has no administration in time range)     Initial Impression / Assessment and Plan / ED Course  I have reviewed the triage vital signs and the nursing notes.  Pertinent labs & imaging results that were available during my care of the patient were reviewed by me and considered in my medical decision making (see chart for details).  27 year old male with history of HIV, presenting to the ED with left lower abdominal pain.  Began last week of sudden onset.  Seen in urgent care and prescribed Flomax and indomethacin without change.  Has history of kidney stones and reports this feels similar.  He is afebrile and nontoxic.  Focal tenderness in the left lower abdomen.  Denies any testicle pain or swelling.  UA pending.  Will send basic labs, obtain CT renal study.  IV pain and nausea medications given.  CT with 4 mm stone in mid left ureter.  Labs overall reassuring.  Patient is asymptomatic after medications here.  Will discharge home with pain and nausea medicines, continue Flomax.  Will follow up with urology if any ongoing issues.  He understands to return here for any new or worsening symptoms.  Final Clinical Impressions(s) / ED Diagnoses   Final diagnoses:  Ureteral stone  Hematuria, unspecified type    ED Discharge Orders        Ordered    oxyCODONE-acetaminophen  (PERCOCET) 5-325 MG tablet  Every 4 hours PRN     06/24/17 0218    ondansetron (ZOFRAN ODT) 4 MG disintegrating tablet  Every 8 hours PRN     06/24/17 0218    tamsulosin (FLOMAX) 0.4 MG CAPS capsule  Daily after supper     06/24/17 0218       Garlon Hatchet, PA-C 06/24/17 0241    Melene Plan, DO 06/24/17 [phone]

## 2017-06-24 ENCOUNTER — Other Ambulatory Visit (HOSPITAL_COMMUNITY): Payer: Self-pay

## 2017-06-24 ENCOUNTER — Emergency Department (HOSPITAL_COMMUNITY): Payer: Medicaid Other

## 2017-06-24 LAB — CBC WITH DIFFERENTIAL/PLATELET
Abs Immature Granulocytes: 0 10*3/uL (ref 0.0–0.1)
Basophils Absolute: 0 10*3/uL (ref 0.0–0.1)
Basophils Relative: 0 %
Eosinophils Absolute: 0.1 10*3/uL (ref 0.0–0.7)
Eosinophils Relative: 1 %
HCT: 38.1 % — ABNORMAL LOW (ref 39.0–52.0)
Hemoglobin: 12.9 g/dL — ABNORMAL LOW (ref 13.0–17.0)
Immature Granulocytes: 0 %
Lymphocytes Relative: 38 %
Lymphs Abs: 1.8 10*3/uL (ref 0.7–4.0)
MCH: 29.3 pg (ref 26.0–34.0)
MCHC: 33.9 g/dL (ref 30.0–36.0)
MCV: 86.6 fL (ref 78.0–100.0)
Monocytes Absolute: 0.5 10*3/uL (ref 0.1–1.0)
Monocytes Relative: 11 %
Neutro Abs: 2.4 10*3/uL (ref 1.7–7.7)
Neutrophils Relative %: 50 %
Platelets: 150 10*3/uL (ref 150–400)
RBC: 4.4 MIL/uL (ref 4.22–5.81)
RDW: 12.3 % (ref 11.5–15.5)
WBC: 4.8 10*3/uL (ref 4.0–10.5)

## 2017-06-24 LAB — BASIC METABOLIC PANEL
Anion gap: 7 (ref 5–15)
BUN: 9 mg/dL (ref 6–20)
CO2: 25 mmol/L (ref 22–32)
Calcium: 8.9 mg/dL (ref 8.9–10.3)
Chloride: 104 mmol/L (ref 101–111)
Creatinine, Ser: 1.32 mg/dL — ABNORMAL HIGH (ref 0.61–1.24)
GFR calc Af Amer: >60 mL/min (ref >60)
GFR calc non Af Amer: >60 mL/min (ref >60)
Glucose, Bld: 89 mg/dL (ref 65–99)
Potassium: 4.1 mmol/L (ref 3.5–5.1)
Sodium: 136 mmol/L (ref 135–145)

## 2017-06-24 MED ORDER — TAMSULOSIN HCL 0.4 MG PO CAPS: 0.4000 mg | ORAL_CAPSULE | Freq: Every day | ORAL | 0 refills | Status: DC

## 2017-06-24 MED ORDER — ONDANSETRON 4 MG PO TBDP: 4.0000 mg | ORAL_TABLET | Freq: Three times a day (TID) | ORAL | 0 refills | Status: DC | PRN

## 2017-06-24 MED ORDER — OXYCODONE-ACETAMINOPHEN 5-325 MG PO TABS: 1.0000 | ORAL_TABLET | ORAL | 0 refills | Status: DC | PRN

## 2017-06-24 NOTE — Discharge Instructions (Signed)
Take the prescribed medication as directed.  Make sure to drink plenty of water. Follow-up with urology if any ongoing issues. Return to the ED for new or worsening symptoms.

## 2017-06-24 NOTE — ED Notes (Signed)
"  Patient verbalizes understanding of discharge instructions. Opportunity for questioning and answers were provided.  pt discharged from ED ."  

## 2017-06-24 NOTE — ED Notes (Signed)
Dilaudid 0.5mg  given to this pt  Wasted too long after he was discharged.  Waste witnessed by Engelhard Corporationjanee rn in the sink

## 2017-06-24 NOTE — ED Notes (Signed)
Known kidney stone c/o pain tonight

## 2017-07-27 ENCOUNTER — Ambulatory Visit: Payer: Medicaid Other

## 2017-07-27 ENCOUNTER — Telehealth: Payer: Self-pay

## 2017-07-27 NOTE — Telephone Encounter (Signed)
PT called case manager from [facility] about medication [facility](Biktarvy) pt stated he only has two pills left and would like to have refills mailed to his house. Informed case manager that pt received refills on 06/29/17 with 11 additonal refills. Informed case manager if pt would like mail order he would have to call the pharmacy to discuss mail order options. Case manager will relay this information back to pt and if he has any further questions advised for pt to call triage.  Steven CourierJose L [name], [place]MA

## 2017-08-04 ENCOUNTER — Telehealth: Payer: Self-pay | Admitting: *Deleted

## 2017-08-04 NOTE — Telephone Encounter (Signed)
Patient reports that this morning when he was showering he notice a scaly, bumpy rash on his genitals.  Also, requesing follow-up appointment.  He has not been seen since 12/2016 by S. Durwin Noraixon, NP.  Appointment made for 08/05/2017 with S. Dixon.

## 2017-08-04 NOTE — Telephone Encounter (Signed)
Glad to hear he is coming back to care.

## 2017-08-05 ENCOUNTER — Ambulatory Visit: Payer: Medicaid Other

## 2017-08-05 ENCOUNTER — Ambulatory Visit: Payer: Medicaid Other | Admitting: Infectious Diseases

## 2017-09-08 ENCOUNTER — Other Ambulatory Visit: Payer: Self-pay | Admitting: *Deleted

## 2017-09-08 DIAGNOSIS — Z113 Encounter for screening for infections with a predominantly sexual mode of transmission: Secondary | ICD-10-CM

## 2017-09-08 DIAGNOSIS — B20 Human immunodeficiency virus [HIV] disease: Secondary | ICD-10-CM

## 2017-09-10 ENCOUNTER — Other Ambulatory Visit: Payer: Medicaid Other

## 2017-09-20 ENCOUNTER — Ambulatory Visit: Payer: Medicaid Other | Admitting: Infectious Diseases

## 2017-09-21 ENCOUNTER — Ambulatory Visit: Payer: Medicaid Other | Admitting: Infectious Diseases

## 2017-11-08 ENCOUNTER — Other Ambulatory Visit: Payer: Self-pay

## 2017-11-08 ENCOUNTER — Emergency Department (HOSPITAL_COMMUNITY)
Admission: EM | Admit: 2017-11-08 | Discharge: 2017-11-08 | Disposition: A | Discharge status: Home / Self Care | Payer: Medicaid Other | Attending: Emergency Medicine | Admitting: Emergency Medicine

## 2017-11-08 ENCOUNTER — Encounter (HOSPITAL_COMMUNITY): Payer: Self-pay | Admitting: Emergency Medicine

## 2017-11-08 DIAGNOSIS — R519 Headache, unspecified: Secondary | ICD-10-CM

## 2017-11-08 DIAGNOSIS — F17210 Nicotine dependence, cigarettes, uncomplicated: Secondary | ICD-10-CM | POA: Insufficient documentation

## 2017-11-08 DIAGNOSIS — R51 Headache: Secondary | ICD-10-CM

## 2017-11-08 DIAGNOSIS — L210 Seborrhea capitis: Secondary | ICD-10-CM

## 2017-11-08 DIAGNOSIS — L219 Seborrheic dermatitis, unspecified: Secondary | ICD-10-CM | POA: Diagnosis not present

## 2017-11-08 DIAGNOSIS — Z79899 Other long term (current) drug therapy: Secondary | ICD-10-CM | POA: Diagnosis not present

## 2017-11-08 NOTE — Discharge Instructions (Signed)
Please start using an antidandruff shampoo such as Selsun Blue or head and shoulders.  Your scalp will be sore for the next few days as you pulled your braids out.  I did not see evidence of an insect.

## 2017-11-08 NOTE — ED Triage Notes (Signed)
Pt states that he felt something bite him on his head about an hour ago and wants to make sure there isn't something in his head. Pt reports that he will occassionally feel something "bite his scalp."

## 2017-11-08 NOTE — ED Notes (Signed)
Called x1 for triage, unable to locate pt

## 2017-11-08 NOTE — ED Provider Notes (Signed)
[facility] EMERGENCY DEPARTMENT Provider Note   CSN: [id number] Arrival date & time: 11/08/17  2123     History   Chief Complaint Chief Complaint  Patient presents with  . Insect Bite    HPI Steven [name] is a 27 y.o. male with history of HIV, who presents today for evaluation of concern for a father.  He reports that approximately an hour prior to arrival he did a bug buzzing and felt like it was on his head.  He had weaving braids that he started pulling in his panic and pulled many of his braids out.  He reports that he was trying to use a towel to get the amount and saw a wing from an insect.  Reports that this is never happened to him before.  He questions if the bug injected poison into him as he became very anxious when the bug was there.  HPI  Past Medical History:  Diagnosis Date  . Depression 07/09/2016  . History of syphilis 07/09/2016  . HIV (human immunodeficiency virus infection) (HCC) 06/29/2016   Dx 06/10/2016  . Kidney stone    kidney stones    Patient Active Problem List   Diagnosis Date Noted  . Constipation 12/18/2016  . Sore throat 12/18/2016  . Exposure to gonorrhea 08/25/2016  . Healthcare maintenance 08/25/2016  . History of syphilis 07/09/2016  . Outbursts of anger 07/09/2016  . HIV (human immunodeficiency virus infection) (HCC) 06/29/2016    History reviewed. No pertinent surgical history.      Home Medications    Prior to Admission medications   Medication Sig Start Date End Date Taking? Authorizing Provider  BIKTARVY 50-200-25 MG TABS tablet TAKE 1 TABLET BY MOUTH DAILY. TRY TO TAKE AT THE SAME TIME EACH DAY WITH OR WITHOUT FOOD. 01/11/17   Blanchard Kelch, NP  indomethacin (INDOCIN) 50 MG capsule Take 1 capsule (50 mg total) by mouth 2 (two) times daily as needed for moderate pain. 06/19/17   Wurst, [place], PA-C  ondansetron (ZOFRAN ODT) 4 MG disintegrating tablet Take 1 tablet (4 mg total) by mouth every 8 (eight)  hours as needed for nausea. 06/24/17   Garlon Hatchet, PA-C  ondansetron (ZOFRAN) 4 MG tablet Take 1 tablet (4 mg total) by mouth every 6 (six) hours as needed for nausea or vomiting. 06/19/17   Wurst, [place], PA-C  oxyCODONE-acetaminophen (PERCOCET) 5-325 MG tablet Take 1 tablet by mouth every 4 (four) hours as needed. 06/24/17   Garlon Hatchet, PA-C  tamsulosin (FLOMAX) 0.4 MG CAPS capsule Take 1 capsule (0.4 mg total) by mouth daily after supper. 06/24/17   Garlon Hatchet, PA-C    Family History Family History  Problem Relation Age of Onset  . Mental illness Mother     Social History Social History   Tobacco Use  . Smoking status: Current Some Day Smoker    Packs/day: 0.50    Types: Cigarettes  . Smokeless tobacco: Never Used  Substance Use Topics  . Alcohol use: Yes    Comment: occasional  . Drug use: Yes    Types: Marijuana     Allergies   Patient has no known allergies.   Review of Systems Review of Systems  Constitutional: Negative for chills and fever.  Skin:       Scalp pain, concern for bug in hair  All other systems reviewed and are negative.    Physical Exam Updated Vital Signs BP 117/72 (BP Location: Right Arm)  Pulse (!) 115   Temp 98.7 F (37.1 C) (Oral)   Resp 18   Ht 5\' 9"  (1.753 m)   Wt 90.7 kg   SpO2 98%   BMI 29.53 kg/m   Physical Exam  Constitutional: He is oriented to person, place, and time. He appears well-developed. No distress.  HENT:  Head: Normocephalic and atraumatic.  Patient is missing a large area of his weaving braids on the top left of his scalp.  The scalp in this general area is red.  There is dandruff present.  No insect found.  Other than erythema diffusely no single area of localized tenderness to palpation or lesion.  Neurological: He is alert and oriented to person, place, and time.  Skin: Skin is warm and dry. He is not diaphoretic.  Psychiatric:  Patient is anxious, does not appear to be responding to internal  stimuli.    Nursing note and vitals reviewed.    ED Treatments / Results  Labs (all labs ordered are listed, but only abnormal results are displayed) Labs Reviewed - No data to display  EKG None  Radiology No results found.  Procedures Procedures (including critical care time)  Medications Ordered in ED Medications - No data to display   Initial Impression / Assessment and Plan / ED Course  I have reviewed the triage vital signs and the nursing notes.  Pertinent labs & imaging results that were available during my care of the patient were reviewed by me and considered in my medical decision making (see chart for details).    Patient presents today for evaluation after he thought he had a bug on his scalp.  After this he reportedly became very anxious and ripped out his weave and braids while running down the street to the neighbor's house.  He called 911 and was transported here.  Evaluation shows dandruff, scalp is generally red in the area where he pulled his braids out, and generally tender.  He does not appear to be responding to internal stimuli at this time, suspect that he truly had a bug near him and had severe anxiety.  At this time he is anxious however calm.  Return precautions were discussed.  Discharge home.  Final Clinical Impressions(s) / ED Diagnoses   Final diagnoses:  Scalp pain  Dandruff    ED Discharge Orders    None       Norman Clay 11/08/17 2225    Wynetta Fines, MD 11/12/17 [phone]

## 2017-12-06 ENCOUNTER — Telehealth: Payer: Self-pay | Admitting: *Deleted

## 2017-12-06 NOTE — Telephone Encounter (Signed)
Patient is asking for an appointment on Wednesday. He has had issues with transportation, but is able to make the appointment on Wednesday.  He states he has not missed any medication, has been in care with [facility]. His case manager is encouraging him to follow up in clinic. Patient scheduled in available slot with Dr Luciana Axeomer. Andree CossHowell, Shizue Kaseman M, RN

## 2017-12-06 NOTE — Telephone Encounter (Signed)
Excellent - I find it hard to believe he has been on his Biktarvy considering I stopped it when I heard he was doubling up on it and taking it inappropriately w/o proper follow up. But maybe someone refilled it... Hope he is doing well.

## 2017-12-08 ENCOUNTER — Ambulatory Visit: Payer: Medicaid Other | Admitting: Internal Medicine

## 2017-12-09 ENCOUNTER — Ambulatory Visit: Payer: Medicaid Other | Admitting: Internal Medicine

## 2017-12-17 ENCOUNTER — Ambulatory Visit (INDEPENDENT_AMBULATORY_CARE_PROVIDER_SITE_OTHER): Payer: Medicaid Other | Admitting: Pharmacist

## 2017-12-17 ENCOUNTER — Other Ambulatory Visit (HOSPITAL_COMMUNITY)
Admission: RE | Admit: 2017-12-17 | Discharge: 2017-12-17 | Disposition: A | Discharge status: Home / Self Care | Payer: Medicaid Other | Source: Ambulatory Visit | Attending: Internal Medicine | Admitting: Internal Medicine

## 2017-12-17 DIAGNOSIS — Z23 Encounter for immunization: Secondary | ICD-10-CM

## 2017-12-17 DIAGNOSIS — B20 Human immunodeficiency virus [HIV] disease: Secondary | ICD-10-CM

## 2017-12-17 DIAGNOSIS — Z7189 Other specified counseling: Secondary | ICD-10-CM

## 2017-12-17 LAB — T-HELPER CELL (CD4) - (RCID CLINIC ONLY)
CD4 % Helper T Cell: 26 % — ABNORMAL LOW (ref 33–55)
CD4 T Cell Abs: 700 /uL (ref 400–2700)

## 2017-12-17 MED ORDER — DARUN-COBIC-EMTRICIT-TENOFAF 800-150-200-10 MG PO TABS: 1.0000 | ORAL_TABLET | Freq: Every day | ORAL | 2 refills | Status: DC

## 2017-12-17 MED FILL — SYMTUZA 800-150-200-10 MG T: 800-150-200 | 30 days supply | Qty: 30 | Fill #0

## 2017-12-17 NOTE — Progress Notes (Signed)
HPI: Steven [name] is a 27 y.o. male who presents to the [facility] pharmacy clinic for HIV follow-up.  He has been out of care and has not been seen since December of last year.  Patient Active Problem List   Diagnosis Date Noted  . Constipation 12/18/2016  . Sore throat 12/18/2016  . Exposure to gonorrhea 08/25/2016  . Healthcare maintenance 08/25/2016  . History of syphilis 07/09/2016  . Outbursts of anger 07/09/2016  . HIV (human immunodeficiency virus infection) (HCC) 06/29/2016    Patient's Medications  New Prescriptions   DARUNAVIR-COBICISCTAT-EMTRICITABINE-TENOFOVIR ALAFENAMIDE (SYMTUZA) 800-150-200-10 MG TABS    Take 1 tablet by mouth daily with breakfast.  Previous Medications   INDOMETHACIN (INDOCIN) 50 MG CAPSULE    Take 1 capsule (50 mg total) by mouth 2 (two) times daily as needed for moderate pain.   ONDANSETRON (ZOFRAN ODT) 4 MG DISINTEGRATING TABLET    Take 1 tablet (4 mg total) by mouth every 8 (eight) hours as needed for nausea.   ONDANSETRON (ZOFRAN) 4 MG TABLET    Take 1 tablet (4 mg total) by mouth every 6 (six) hours as needed for nausea or vomiting.   OXYCODONE-ACETAMINOPHEN (PERCOCET) 5-325 MG TABLET    Take 1 tablet by mouth every 4 (four) hours as needed.   TAMSULOSIN (FLOMAX) 0.4 MG CAPS CAPSULE    Take 1 capsule (0.4 mg total) by mouth daily after supper.  Modified Medications   No medications on file  Discontinued Medications   BIKTARVY 50-200-25 MG TABS TABLET    TAKE 1 TABLET BY MOUTH DAILY. TRY TO TAKE AT THE SAME TIME EACH DAY WITH OR WITHOUT FOOD.    Allergies: No Known Allergies  Past Medical History: Past Medical History:  Diagnosis Date  . Depression 07/09/2016  . History of syphilis 07/09/2016  . HIV (human immunodeficiency virus infection) (HCC) 06/29/2016   Dx 06/10/2016  . Kidney stone    kidney stones    Social History: Social History   Socioeconomic History  . Marital status: Single    Spouse name: Not on file  . Number of  children: Not on file  . Years of education: Not on file  . Highest education level: Not on file  Occupational History  . Not on file  Social Needs  . Financial resource strain: Not on file  . Food insecurity:    Worry: Not on file    Inability: Not on file  . Transportation needs:    Medical: Not on file    Non-medical: Not on file  Tobacco Use  . Smoking status: Current Some Day Smoker    Packs/day: 0.50    Types: Cigarettes  . Smokeless tobacco: Never Used  Substance and Sexual Activity  . Alcohol use: Yes    Comment: occasional  . Drug use: Yes    Types: Marijuana  . Sexual activity: Yes    Partners: Male    Birth control/protection: Condom  Lifestyle  . Physical activity:    Days per week: Not on file    Minutes per session: Not on file  . Stress: Not on file  Relationships  . Social connections:    Talks on phone: Not on file    Gets together: Not on file    Attends religious service: Not on file    Active member of club or organization: Not on file    Attends meetings of clubs or organizations: Not on file    Relationship status: Not on file  Other Topics Concern  . Not on file  Social History Narrative  . Not on file    Labs: Lab Results  Component Value Date   HIV1RNAQUANT 2,690 (H) 01/28/2017   HIV1RNAQUANT 192 (H) 12/18/2016   HIV1RNAQUANT 113 (H) 08/25/2016   CD4TABS 490 12/18/2016   CD4TABS 460 08/25/2016   CD4TABS 280 (L) 07/09/2016    RPR and STI Lab Results  Component Value Date   LABRPR NON-REACTIVE 01/28/2017   LABRPR REACTIVE (A) 12/18/2016   LABRPR NON REAC 07/09/2016   LABRPR Non Reactive 06/10/2016   RPRTITER 1:1 (H) 12/18/2016    STI Results GC CT  01/28/2017 Negative Negative  12/18/2016 Negative Negative  12/18/2016 Negative Negative  12/18/2016 Negative Negative  08/25/2016 Negative Negative  08/25/2016 **POSITIVE**(A) Negative  07/09/2016 Negative Negative  07/09/2016 Negative Negative  06/10/2016 Negative Negative     Hepatitis B Lab Results  Component Value Date   HEPBSAB NEG 07/09/2016   HEPBSAG NEGATIVE 07/09/2016   Hepatitis C No results found for: HEPCAB, HCVRNAPCRQN Hepatitis A Lab Results  Component Value Date   HAV NON REACTIVE 07/09/2016   Lipids: Lab Results  Component Value Date   CHOL 141 08/05/2016   TRIG 33 08/05/2016   HDL 61 08/05/2016   CHOLHDL 2.3 08/05/2016   VLDL 6 07/09/2016   LDLCALC 73 08/05/2016    Current HIV Regimen: Biktarvy - on and off  Assessment: [name] comes in today for HIV follow-up. He has been out of care for the last year and was last seen by Judeth Cornfield in December of 2018.  He has no showed many appointments since then due to transportation and social issues.  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  He states that the would smoke both and snort the cocaine on occasion. He has not injected anything he tells me but has used different bowls and straws that other people have used as well.  He is currently trying to turn his life around and states that he is 30 days sober, which I congratulated him on. He tells me that he feels like he has almost overdosed a few times in the last year where he passes out for long periods of time really high.  He describes an incident where he thought he had something crawling on his head and was "tripping hardcore" to where his friends called his family and an ambulance. He stated that was the first time his family saw him high and he was embarrassed and was a wake up call. He states he threw his bowl the next morning and it shattered.  He reports that he still smokes marijuana but has not done there other drugs in over a month.  He is in church now and will be going to [place] soon to get into their program. Rene Kocher is off today, so I will try and hook him up with her when he comes back to see me.  He says that he has been on and off of his Biktarvy all year.   He did not take the [facility] for 4-5 months and then had a bottle laying around and took it in September but has not taken anything since then.  We stopped his refills back in May at [facility]. He estimates that out of the last 18 months since he was diagnosed that he has taken it for a total of 6 months.  We had a long talk about  adherence and staying in care. I feel like he is really motivated to stay in care and get back on his HIV medications. He does have transportation issues.  His mother brought him today and he sates they are working on him getting a license and will hopefully have that work out after the first of the year. We have a transportation program here that I can get him in if needed for the future.  I will change him to Symtuza today due to his adherence issues. I did tell him that if he did not tolerate it well that we could go back to [place].  Told him to take it every day (one pill once daily) with food. He also takes a hair vitamin and iron as well. The Biktarvy would interact with the iron, so if he switches back, he will need proper counseling on administration of those two medications.   He is in need of many vaccinations. He had one HPV vaccine back in January and had his Pneumovax in August of 2018.  He needs the Hep A and B series, the rest of his HPV vaccines, Prevnar, Menveo, and the flu shot.  Will prioritize today and give him the flu shot, Prevnar, and his 2nd HPV. He will need his 3rd HPV vaccine in 3 months.   He states he hasn't been sexually active very much in the last year as he states he has been "walled off" from people and just doing heavy drugs. He did say he had one partner back in November where it was unprotected and he was the receptive partner.  He has a history of being very sexually active, so will do STD screening today as well. He has had some night sweats, headaches, and shortness of breath the last few months.  No fevers, rash, chest pain, or sore throat.  I  told him that depending on his CD4 count today that we may have to start him on Bactrim for PCP ppx.  Will check all labs today including a HIV viral load with reflex, CD4, CBC, CMET, and lipid profile.  Will screen for Hep A, B and C since he has had some high risk behavior with drug use recently. Will also check for syphilis and gonorrhea/chlamydia in all 3 sites. Will [facility] his S[facility] (he has Medicaid) [facility] and they will mail it to his address and he will start on Monday. I will have him come back to see me a few more times before I can trust him to not no-show the other providers. Will do labs again, adherence, and more vaccines at next appointment.  [facility] time he is here.  Plan: - Discontinue Biktarvy - Start Symtuza PO once daily with food - Urine/oral/rectal gc/c screening, RPR - HIV VL with reflex, CD4, CBC, CMET, lipid panel - Hep A antibody, Hep B surf ag, Hep C antibody - Flu shot, HPV #2/3, Prevnar today - Hep B vaccine, Hep A vaccine, Menveo next time - F/u with me again 1/15 at 10am  Fradel Baldonado L. Malakhi Markwood, PharmD, BCIDP, AAHIVP, CPP Infectious Diseases Clinical Pharmacist [facility] 12/17/2017, 11:13 AM

## 2017-12-20 NOTE — Progress Notes (Signed)
This would require 3 shots, correct? I always like to verify my syphilis information before proceeding. :)

## 2017-12-20 NOTE — Progress Notes (Signed)
Ok yea true.. it was close to the 12 months so wanted to verify. Thank you!

## 2017-12-21 LAB — CYTOLOGY, (ORAL, ANAL, URETHRAL) ANCILLARY ONLY
Chlamydia: NEGATIVE
Chlamydia: POSITIVE — AB
Neisseria Gonorrhea: NEGATIVE
Neisseria Gonorrhea: NEGATIVE

## 2017-12-21 LAB — URINE CYTOLOGY ANCILLARY ONLY
Chlamydia: NEGATIVE
Neisseria Gonorrhea: NEGATIVE

## 2017-12-22 ENCOUNTER — Telehealth: Payer: Self-pay | Admitting: Pharmacist

## 2017-12-22 ENCOUNTER — Encounter: Payer: Self-pay | Admitting: Pharmacist

## 2017-12-22 NOTE — Progress Notes (Signed)
error 

## 2017-12-22 NOTE — Telephone Encounter (Signed)
Patient's RPR came back positive indicating syphilis infection. His rectal swab was also positive for chlamydia.  He will need:  - Bicillin 2.4 million units IM x 1 - Azithromycin 1 gm PO x 1  He will come in tomorrow for treatment.

## 2017-12-23 ENCOUNTER — Ambulatory Visit (INDEPENDENT_AMBULATORY_CARE_PROVIDER_SITE_OTHER): Payer: Medicaid Other | Admitting: *Deleted

## 2017-12-23 DIAGNOSIS — A539 Syphilis, unspecified: Secondary | ICD-10-CM | POA: Diagnosis not present

## 2017-12-23 DIAGNOSIS — A64 Unspecified sexually transmitted disease: Secondary | ICD-10-CM | POA: Diagnosis not present

## 2017-12-23 DIAGNOSIS — A568 Sexually transmitted chlamydial infection of other sites: Secondary | ICD-10-CM

## 2017-12-23 LAB — CBC
HCT: 43.5 % (ref 38.5–50.0)
Hemoglobin: 15.2 g/dL (ref 13.2–17.1)
MCH: 29.7 pg (ref 27.0–33.0)
MCHC: 34.9 g/dL (ref 32.0–36.0)
MCV: 85 fL (ref 80.0–100.0)
MPV: 10.8 fL (ref 7.5–12.5)
Platelets: 175 10*3/uL (ref 140–400)
RBC: 5.12 10*6/uL (ref 4.20–5.80)
RDW: 13.2 % (ref 11.0–15.0)
WBC: 5 10*3/uL (ref 3.8–10.8)

## 2017-12-23 LAB — COMPREHENSIVE METABOLIC PANEL
AG Ratio: 2 (calc) (ref 1.0–2.5)
ALT: 19 U/L (ref 9–46)
AST: 17 U/L (ref 10–40)
Albumin: 4.9 g/dL (ref 3.6–5.1)
Alkaline phosphatase (APISO): 56 U/L (ref 40–115)
BUN: 12 mg/dL (ref 7–25)
CO2: 29 mmol/L (ref 20–32)
Calcium: 9.5 mg/dL (ref 8.6–10.3)
Chloride: 105 mmol/L (ref 98–110)
Creat: 0.95 mg/dL (ref 0.60–1.35)
Globulin: 2.4 g/dL (calc) (ref 1.9–3.7)
Glucose, Bld: 82 mg/dL (ref 65–99)
Potassium: 3.8 mmol/L (ref 3.5–5.3)
Sodium: 140 mmol/L (ref 135–146)
Total Bilirubin: 0.4 mg/dL (ref 0.2–1.2)
Total Protein: 7.3 g/dL (ref 6.1–8.1)

## 2017-12-23 LAB — LIPID PANEL
Cholesterol: 135 mg/dL (ref <200)
HDL: 56 mg/dL (ref >40)
LDL Cholesterol (Calc): 70 mg/dL (calc)
Non-HDL Cholesterol (Calc): 79 mg/dL (calc) (ref <130)
Total CHOL/HDL Ratio: 2.4 (calc) (ref <5.0)
Triglycerides: 33 mg/dL (ref <150)

## 2017-12-23 LAB — RPR TITER: RPR Titer: 1:8 {titer} — ABNORMAL HIGH

## 2017-12-23 LAB — RPR: RPR Ser Ql: REACTIVE — AB

## 2017-12-23 LAB — HIV RNA, RTPCR W/R GT (RTI, PI,INT)
HIV 1 RNA Quant: <20 copies/mL
HIV-1 RNA Quant, Log: <1.3 Log copies/mL

## 2017-12-23 LAB — HEPATITIS A ANTIBODY, TOTAL: Hepatitis A AB,Total: NONREACTIVE

## 2017-12-23 LAB — HEPATITIS C ANTIBODY
Hepatitis C Ab: NONREACTIVE
SIGNAL TO CUT-OFF: 0.02 (ref <1.00)

## 2017-12-23 LAB — HEPATITIS B SURFACE ANTIGEN: Hepatitis B Surface Ag: NONREACTIVE

## 2017-12-23 LAB — FLUORESCENT TREPONEMAL AB(FTA)-IGG-BLD: Fluorescent Treponemal ABS: REACTIVE — AB

## 2017-12-23 MED ORDER — PENICILLIN G BENZATHINE 1200000 UNIT/2ML IM SUSP
1.2000 10*6.[IU] | Freq: Once | INTRAMUSCULAR | Status: AC
  Administered 2017-12-23: 1.2 10*6.[IU] via INTRAMUSCULAR

## 2017-12-23 MED ORDER — AZITHROMYCIN 250 MG PO TABS
1000.0000 mg | ORAL_TABLET | Freq: Once | ORAL | Status: AC
  Administered 2017-12-23: 1000 mg via ORAL

## 2017-12-23 MED ORDER — AZITHROMYCIN 250 MG PO TABS: 250.0000 mg | ORAL_TABLET | Freq: Once | ORAL | Status: DC

## 2017-12-30 ENCOUNTER — Encounter: Payer: Medicaid Other | Admitting: Infectious Diseases

## 2018-01-11 ENCOUNTER — Ambulatory Visit: Payer: Medicaid Other | Admitting: Internal Medicine

## 2018-01-19 ENCOUNTER — Ambulatory Visit: Payer: Medicaid Other

## 2018-01-28 ENCOUNTER — Telehealth: Payer: Self-pay | Admitting: Pharmacist

## 2018-01-28 NOTE — Telephone Encounter (Signed)
Patient no showed his appointment with me last week and has been unable to reach by phone. His phone goes straight to voicemail.  We tried calling his grandmother's phone (listed under contacts) and there was no answer on that line either.  [facility] is also unable to get in touch with him for his Symtuza refill.

## 2018-02-01 ENCOUNTER — Encounter (HOSPITAL_COMMUNITY): Payer: Self-pay

## 2018-02-01 ENCOUNTER — Telehealth: Payer: Self-pay | Admitting: *Deleted

## 2018-02-01 ENCOUNTER — Emergency Department (HOSPITAL_COMMUNITY)
Admission: EM | Admit: 2018-02-01 | Discharge: 2018-02-01 | Disposition: A | Discharge status: Home / Self Care | Payer: Medicaid Other | Attending: Emergency Medicine | Admitting: Emergency Medicine

## 2018-02-01 ENCOUNTER — Emergency Department (HOSPITAL_COMMUNITY): Payer: Medicaid Other

## 2018-02-01 DIAGNOSIS — F17210 Nicotine dependence, cigarettes, uncomplicated: Secondary | ICD-10-CM | POA: Diagnosis not present

## 2018-02-01 DIAGNOSIS — M7918 Myalgia, other site: Secondary | ICD-10-CM | POA: Diagnosis not present

## 2018-02-01 DIAGNOSIS — Z79899 Other long term (current) drug therapy: Secondary | ICD-10-CM | POA: Diagnosis not present

## 2018-02-01 DIAGNOSIS — J111 Influenza due to unidentified influenza virus with other respiratory manifestations: Secondary | ICD-10-CM | POA: Insufficient documentation

## 2018-02-01 DIAGNOSIS — R05 Cough: Secondary | ICD-10-CM | POA: Diagnosis present

## 2018-02-01 DIAGNOSIS — B20 Human immunodeficiency virus [HIV] disease: Secondary | ICD-10-CM | POA: Insufficient documentation

## 2018-02-01 DIAGNOSIS — F1290 Cannabis use, unspecified, uncomplicated: Secondary | ICD-10-CM | POA: Diagnosis not present

## 2018-02-01 DIAGNOSIS — R69 Illness, unspecified: Secondary | ICD-10-CM

## 2018-02-01 DIAGNOSIS — R51 Headache: Secondary | ICD-10-CM | POA: Diagnosis not present

## 2018-02-01 MED ORDER — OSELTAMIVIR PHOSPHATE 75 MG PO CAPS
75.0000 mg | ORAL_CAPSULE | Freq: Once | ORAL | Status: AC
  Administered 2018-02-01: 75 mg via ORAL
  Filled 2018-02-01: qty 1

## 2018-02-01 MED ORDER — OSELTAMIVIR PHOSPHATE 75 MG PO CAPS: 75.0000 mg | ORAL_CAPSULE | Freq: Two times a day (BID) | ORAL | 0 refills | Status: DC

## 2018-02-01 MED ORDER — ACETAMINOPHEN 325 MG PO TABS
650.0000 mg | ORAL_TABLET | Freq: Once | ORAL | Status: AC | PRN
  Administered 2018-02-01: 650 mg via ORAL
  Filled 2018-02-01: qty 2

## 2018-02-01 NOTE — ED Triage Notes (Addendum)
Pt BIB [facility] for flu sx x 1 days. VSS, Pt has HIV. Pt's partner also has same sx at home.

## 2018-02-01 NOTE — Telephone Encounter (Signed)
Thank you for the info - I heard Dr. Luciana Axe take the call about him in the ER today regarding his condition. I hope that he comes to his scheduled appointment.

## 2018-02-01 NOTE — Discharge Instructions (Addendum)
Call the infectious disease clinic at [phone] tomorrow morning arrange to be seen later this week.  Take Tylenol every 4 hours for aches or for temperature higher than 100.4

## 2018-02-01 NOTE — ED Notes (Signed)
Patient verbalizes understanding of discharge instructions. Opportunity for questioning and answers were provided. Armband removed by staff, pt discharged from ED ambulatory.   

## 2018-02-01 NOTE — Telephone Encounter (Signed)
Patient walked in from ED discharge, wanted to speak with his doctor. RN advised we were closing, noted he is overdue for a visit with a provider and labs. Patient scheduled for labs 1/31 with follow up 2/14 with [facility].  He has not seen a provider in over 1 year, pharmacy has not been able to get in touch with him for symtuza fill - he states he doesn't get calls (but has not checked voicemail).  RN notified Cassie that he was here, confirmed phone number in chart, let patient know that pharmacy will only deliver medication if he answers their phone call. Patient lets the calls go to voicemail and does not return them.  RN gave patient [facility] phone number [phone]), asked him to program it in and call them today.  He stated he would. Patient given 2 bus passes. Andree Coss, RN

## 2018-02-01 NOTE — ED Provider Notes (Addendum)
[facility] EMERGENCY DEPARTMENT Provider Note   CSN: [id number] Arrival date & time: 02/01/18  1315     History   Chief Complaint Chief Complaint  Patient presents with  . Influenza    HPI Steven [name] is a 28 y.o. male.  Complains of cough sneeze diffuse myalgias and, mild sore throat, headache fever onset 3 days ago.  He denies any shortness of breath.  With Tylenol PM with relief.  No nausea vomiting or diarrhea.  No other associated symptoms.  HPI  Past Medical History:  Diagnosis Date  . Depression 07/09/2016  . History of syphilis 07/09/2016  . HIV (human immunodeficiency virus infection) ([place]) 06/29/2016   Dx 06/10/2016  . Kidney stone    kidney stones    Patient Active Problem List   Diagnosis Date Noted  . Constipation 12/18/2016  . Sore throat 12/18/2016  . Exposure to gonorrhea 08/25/2016  . Healthcare maintenance 08/25/2016  . History of syphilis 07/09/2016  . Outbursts of anger 07/09/2016  . HIV (human immunodeficiency virus infection) ([place]) 06/29/2016    History reviewed. No pertinent surgical history.      Home Medications    Prior to Admission medications   Medication Sig Start Date End Date Taking? Authorizing Provider  Darunavir-Cobicisctat-Emtricitabine-Tenofovir Alafenamide (SYMTUZA) 800-150-200-10 MG TABS Take 1 tablet by mouth daily with breakfast. 12/17/17   Kuppelweiser, Cassie L, RPH-CPP  indomethacin (INDOCIN) 50 MG capsule Take 1 capsule (50 mg total) by mouth 2 (two) times daily as needed for moderate pain. 06/19/17   Wurst, [place], PA-C  ondansetron (ZOFRAN ODT) 4 MG disintegrating tablet Take 1 tablet (4 mg total) by mouth every 8 (eight) hours as needed for nausea. 06/24/17   Larene Pickett, PA-C  ondansetron (ZOFRAN) 4 MG tablet Take 1 tablet (4 mg total) by mouth every 6 (six) hours as needed for nausea or vomiting. 06/19/17   Wurst, [place], PA-C  oxyCODONE-acetaminophen (PERCOCET) 5-325 MG tablet Take 1 tablet  by mouth every 4 (four) hours as needed. 06/24/17   Larene Pickett, PA-C  tamsulosin (FLOMAX) 0.4 MG CAPS capsule Take 1 capsule (0.4 mg total) by mouth daily after supper. 06/24/17   Larene Pickett, PA-C    Family History Family History  Problem Relation Age of Onset  . Mental illness Mother     Social History Social History   Tobacco Use  . Smoking status: Current Some Day Smoker    Packs/day: 0.50    Types: Cigarettes  . Smokeless tobacco: Never Used  Substance Use Topics  . Alcohol use: Yes    Comment: occasional  . Drug use: Yes    Types: Marijuana     Allergies   Patient has no known allergies.   Review of Systems Review of Systems  Constitutional: Negative.   HENT: Positive for sneezing and sore throat.   Respiratory: Positive for cough.   Cardiovascular: Negative.   Gastrointestinal: Negative.   Musculoskeletal: Positive for myalgias.  Skin: Negative.   Allergic/Immunologic: Positive for immunocompromised state.  Neurological: Positive for headaches.  Psychiatric/Behavioral: Negative.   All other systems reviewed and are negative.    Physical Exam Updated Vital Signs BP 120/75 (BP Location: Right Arm)   Pulse 86   Temp 100.2 F (37.9 C) (Oral)   Resp 18   SpO2 100%   Physical Exam Vitals signs and nursing note reviewed.  Constitutional:      Appearance: He is well-developed. He is not ill-appearing.  HENT:  Head: Normocephalic and atraumatic.     Right Ear: Tympanic membrane, ear canal and external ear normal.     Left Ear: Tympanic membrane, ear canal and external ear normal.     Mouth/Throat:     Mouth: Mucous membranes are moist.     Pharynx: Posterior oropharyngeal erythema present.     Comments: Uvula midline no exudate Eyes:     Conjunctiva/sclera: Conjunctivae normal.     Pupils: Pupils are equal, round, and reactive to light.  Neck:     Musculoskeletal: Neck supple.     Thyroid: No thyromegaly.     Trachea: No tracheal  deviation.  Cardiovascular:     Rate and Rhythm: Normal rate and regular rhythm.     Heart sounds: No murmur.  Pulmonary:     Effort: Pulmonary effort is normal.     Breath sounds: Normal breath sounds.  Abdominal:     General: Bowel sounds are normal. There is no distension.     Palpations: Abdomen is soft.     Tenderness: There is no abdominal tenderness.  Musculoskeletal: Normal range of motion.        General: No tenderness.  Lymphadenopathy:     Cervical: No cervical adenopathy.  Skin:    General: Skin is warm and dry.     Findings: No rash.  Neurological:     Mental Status: He is alert.     Coordination: Coordination normal.      ED Treatments / Results  Labs (all labs ordered are listed, but only abnormal results are displayed) Labs Reviewed - No data to display  EKG None  Radiology Dg Chest 2 View  Result Date: 02/01/2018 CLINICAL DATA:  Chest pain, fever, and body aches. EXAM: CHEST - 2 VIEW COMPARISON:  Chest x-ray dated November 15, 2016. FINDINGS: The heart size and mediastinal contours are within normal limits. Both lungs are clear. The visualized skeletal structures are unremarkable. IMPRESSION: No active cardiopulmonary disease. Electronically Signed   By: Titus Dubin M.D.   On: 02/01/2018 14:05    Procedures Procedures (including critical care time)  Medications Ordered in ED Medications  acetaminophen (TYLENOL) tablet 650 mg (650 mg Oral Given 02/01/18 1338)   cxr viewe by me Results for orders placed or performed in visit on 12/17/17  HIV RNA, RTPCR W/R GT (RTI, PI,INT)  Result Value Ref Range   HIV 1 RNA Quant <20 DETECTED copies/mL   HIV-1 RNA Quant, Log <1.30 DETECTED Log copies/mL  T-helper cell (CD4)- ([facility] clinic only)  Result Value Ref Range   CD4 T Cell Abs 700 400 - 2,700 /uL   CD4 % Helper T Cell 26 (L) 33 - 55 %  Comp Met (CMET)  Result Value Ref Range   Glucose, Bld 82 65 - 99 mg/dL   BUN 12 7 - 25 mg/dL   Creat 0.95 0.60 -  1.35 mg/dL   BUN/Creatinine Ratio NOT APPLICABLE 6 - 22 (calc)   Sodium 140 135 - 146 mmol/L   Potassium 3.8 3.5 - 5.3 mmol/L   Chloride 105 98 - 110 mmol/L   CO2 29 20 - 32 mmol/L   Calcium 9.5 8.6 - 10.3 mg/dL   Total Protein 7.3 6.1 - 8.1 g/dL   Albumin 4.9 3.6 - 5.1 g/dL   Globulin 2.4 1.9 - 3.7 g/dL (calc)   AG Ratio 2.0 1.0 - 2.5 (calc)   Total Bilirubin 0.4 0.2 - 1.2 mg/dL   Alkaline phosphatase (APISO) 56 40 - 115  U/L   AST 17 10 - 40 U/L   ALT 19 9 - 46 U/L  CBC  Result Value Ref Range   WBC 5.0 3.8 - 10.8 Thousand/uL   RBC 5.12 4.20 - 5.80 Million/uL   Hemoglobin 15.2 13.2 - 17.1 g/dL   HCT 43.5 38.5 - 50.0 %   MCV 85.0 80.0 - 100.0 fL   MCH 29.7 27.0 - 33.0 pg   MCHC 34.9 32.0 - 36.0 g/dL   RDW 13.2 11.0 - 15.0 %   Platelets 175 140 - 400 Thousand/uL   MPV 10.8 7.5 - 12.5 fL  Lipid panel  Result Value Ref Range   Cholesterol 135 <200 mg/dL   HDL 56 >40 mg/dL   Triglycerides 33 <150 mg/dL   LDL Cholesterol (Calc) 70 mg/dL (calc)   Total CHOL/HDL Ratio 2.4 <5.0 (calc)   Non-HDL Cholesterol (Calc) 79 <130 mg/dL (calc)  Hepatitis C antibody  Result Value Ref Range   Hepatitis C Ab NON-REACTIVE NON-REACTI   SIGNAL TO CUT-OFF 0.02 <1.00  Hepatitis B surface antigen  Result Value Ref Range   Hepatitis B Surface Ag NON-REACTIVE NON-REACTI  Hepatitis A antibody, total  Result Value Ref Range   Hepatitis A AB,Total NON-REACTIVE NON-REACTI  RPR  Result Value Ref Range   RPR Ser Ql REACTIVE (A) NON-REACTI  Rpr titer  Result Value Ref Range   RPR Titer 1:8 (H)   Fluorescent treponemal ab(fta)-IgG-bld  Result Value Ref Range   Fluorescent Treponemal ABS REACTIVE (A) NON-REACTI  Cytology (rectal) ancillary only  Result Value Ref Range   Chlamydia **POSITIVE** (A)    Neisseria gonorrhea Negative   Cytology (oral) ancillary only  Result Value Ref Range   Chlamydia Negative    Neisseria gonorrhea Negative   Urine cytology ancillary only  Result Value Ref Range     Chlamydia Negative    Neisseria gonorrhea Negative    Dg Chest 2 View  Result Date: 02/01/2018 CLINICAL DATA:  Chest pain, fever, and body aches. EXAM: CHEST - 2 VIEW COMPARISON:  Chest x-ray dated November 15, 2016. FINDINGS: The heart size and mediastinal contours are within normal limits. Both lungs are clear. The visualized skeletal structures are unremarkable. IMPRESSION: No active cardiopulmonary disease. Electronically Signed   By: Titus Dubin M.D.   On: 02/01/2018 14:05    Initial Impression / Assessment and Plan / ED Course  I have reviewed the triage vital signs and the nursing notes.  Pertinent labs & imaging results that were available during my care of the patient were reviewed by me and considered in my medical decision making (see chart for details).     Patient with influenza-like illness.  Discussed case with infectious disease physician on call Dr. Novella Olive.  Plan prescription Tamiflu.  Follow-up in infectious disease clinic this week  Final Clinical Impressions(s) / ED Diagnoses  Diagnosis influenza-like illness Final diagnoses:  None    ED Discharge Orders    None       Orlie Dakin, MD 02/01/18 [place], MD 02/01/18 [phone]

## 2018-02-04 ENCOUNTER — Other Ambulatory Visit: Payer: Medicaid Other

## 2018-02-18 ENCOUNTER — Encounter: Payer: Medicaid Other | Admitting: Infectious Diseases

## 2018-03-03 ENCOUNTER — Telehealth: Payer: Self-pay

## 2018-03-03 NOTE — Telephone Encounter (Signed)
Thanks so much Steven [name]! 

## 2018-03-03 NOTE — Telephone Encounter (Signed)
Patient is scheduled to see Steven Eke, Np on 2/28 at 10:30. Patient is aware that he will be meeting both pharmacy staff and NP. Patient states that he has transportation set up for tomorrows appointment.  Steven [name], [place]

## 2018-03-03 NOTE — Telephone Encounter (Signed)
Patient really needs to be seen by [name]/another provider as well.  He's only seen me since December 2018.

## 2018-03-03 NOTE — Telephone Encounter (Signed)
You know I am always happy to see Dax - he just needs to show up. I appreciate all of your help to get him here. Jonetta Speak can we ask Onalee Hua to arrange a medical Lyft for him too if the bus pass option does not pan out? Ambre may need to help with this piece. If you want to wait until next week to try him on my schedule we can do that too.

## 2018-03-03 NOTE — Telephone Encounter (Signed)
Received a call from patient today requesting an appointment with our office. Patient has had multiple no shows with Rexene Alberts, Np and Pharmacy. Patient states that he has transportation issues, and relies on taking bus to get around. Patient would like to know if our office would be able to mail bus passes to his address so he can make it to his appointment. Patient would also like to know if he would be able to restart working with outreach RN; states that home visits from [place], [place] helped him stay on top of his medication.  Patient states during call that he has been off his symtuza (big yellow pill) for a week now. Has been taking his small red pill from previous regimen as alternative. Will have patient come into clinic on 2/28 to meet with pharmacy staff. Will route message to Rexene Alberts, Np and Lind Covert, Rn regarding transportation concerns and restarting home health visits. Lorenso Courier, [place]

## 2018-03-04 ENCOUNTER — Ambulatory Visit: Payer: Medicaid Other | Admitting: Family

## 2018-03-04 ENCOUNTER — Ambulatory Visit: Payer: Medicaid Other | Admitting: Pharmacist

## 2018-03-22 ENCOUNTER — Other Ambulatory Visit: Payer: Self-pay

## 2018-03-22 ENCOUNTER — Other Ambulatory Visit (HOSPITAL_COMMUNITY)
Admission: RE | Admit: 2018-03-22 | Discharge: 2018-03-22 | Disposition: A | Discharge status: Home / Self Care | Payer: Medicaid Other | Source: Ambulatory Visit | Attending: Family | Admitting: Family

## 2018-03-22 ENCOUNTER — Encounter: Payer: Self-pay | Admitting: Family

## 2018-03-22 ENCOUNTER — Ambulatory Visit (INDEPENDENT_AMBULATORY_CARE_PROVIDER_SITE_OTHER): Payer: Medicaid Other | Admitting: Family

## 2018-03-22 VITALS — BP 119/75 | HR 85 | Temp 99.1°F | Ht 71.0 in | Wt 205.0 lb

## 2018-03-22 DIAGNOSIS — Z113 Encounter for screening for infections with a predominantly sexual mode of transmission: Secondary | ICD-10-CM

## 2018-03-22 DIAGNOSIS — Z21 Asymptomatic human immunodeficiency virus [HIV] infection status: Secondary | ICD-10-CM | POA: Diagnosis not present

## 2018-03-22 DIAGNOSIS — Z23 Encounter for immunization: Secondary | ICD-10-CM | POA: Diagnosis not present

## 2018-03-22 DIAGNOSIS — Z0000 Encounter for general adult medical examination without abnormal findings: Secondary | ICD-10-CM

## 2018-03-22 DIAGNOSIS — B20 Human immunodeficiency virus [HIV] disease: Secondary | ICD-10-CM

## 2018-03-22 MED ORDER — DARUN-COBIC-EMTRICIT-TENOFAF 800-150-200-10 MG PO TABS: 1.0000 | ORAL_TABLET | Freq: Every day | ORAL | 2 refills | Status: DC

## 2018-03-22 NOTE — Patient Instructions (Signed)
Nice to see you!  We will check your lab work today.  A new prescription for Symtuza was sent to [facility]. Please continue to take it daily.  We will be in touch with the results of your testing.  Plan for follow up in 6 weeks or sooner if needed with lab work 1-2 weeks prior to appointment.   Have a great day!

## 2018-03-22 NOTE — Assessment & Plan Note (Addendum)
   Menveo updated today.  Discussed importance of safe sexual practice to reduce risk of acquisition or transmission of STI. Condoms provided per patient request.  Check for STI today given recent history of Chlamydia, gonorrhea and syphilis.   Working on dental screening having missed his last appointment.

## 2018-03-22 NOTE — Assessment & Plan Note (Signed)
Steven [name] has waxing and waning adherence to his ART regimen of Symtuza reportedly having missed 2 doses since his last appointment, however has not refilled his medication at the pharmacy. He has no signs/symptoms of opportunistic infection or progressive HIV at present. Emphasized importance of taking medications to prevent progression of disease and reduce risk of complications in the future. Remains covered through Medicaid. Check blood work today. Continue current dose of Symtuza. Plan for follow up in 6 weeks or sooner if needed with lab work 1-2 weeks prior to appointment.

## 2018-03-22 NOTE — Progress Notes (Signed)
Subjective:    Patient ID: Steven [name], male    DOB: 11/19/1990, 28 y.o.   MRN: [id number]  Chief Complaint  Patient presents with  . HIV Positive/AIDS     HPI:  Steven [name] is a 28 y.o. male who presents today for routine follow up of HIV disease.  Mr. Steven [name] was last seen in the office on 12/18/16 with good tolerance and less than optimal adherence to his ART regimen of Biktarvy. Blood work at the time showed viral suppression with a viral load that was undetectable and CD4 count of 700. RPR was positive at 1:8 and treated with 2.4 million units of Bicillin on 12/23/17. On 12/18/18 he followed with pharmacy and was changed to Symtuza due to his poor adherence to [facility]. Health maintenance due includes Menveo.   Mr. Steven [name] has been taking his Symtuza as prescribed with no adverse side effects. Ran out of medication about 2 days ago. Overall feeling good today and continues to intentionally lose weight.  Denies fevers, chills, night sweats, headaches, changes in vision, neck pain/stiffness, nausea, diarrhea, vomiting, lesions or rashes.  Mr. Steven [name] [facility] and [facility]. He just had his phone fixed and phone number is updated in chart. He is not currently working but is seeking working doing something with clothing potentially. He is due a dental screen and missed his last appointment with his dentist. Denies feeling down, depressed or hopeless recently. No recreational or illicit drug use. Not currently sexually active and not in a relationship.   No Known Allergies    Outpatient Medications Prior to Visit  Medication Sig Dispense Refill  . Darunavir-Cobicisctat-Emtricitabine-Tenofovir Alafenamide (SYMTUZA) 800-150-200-10 MG TABS Take 1 tablet by mouth daily with breakfast. 30 tablet 2  . indomethacin (INDOCIN) 50 MG capsule Take 1 capsule (50 mg total) by mouth 2 (two) times daily as needed for  moderate pain. (Patient not taking: Reported on 03/22/2018) 20 capsule 0  . ondansetron (ZOFRAN ODT) 4 MG disintegrating tablet Take 1 tablet (4 mg total) by mouth every 8 (eight) hours as needed for nausea. (Patient not taking: Reported on 03/22/2018) 10 tablet 0  . ondansetron (ZOFRAN) 4 MG tablet Take 1 tablet (4 mg total) by mouth every 6 (six) hours as needed for nausea or vomiting. (Patient not taking: Reported on 03/22/2018) 12 tablet 0  . oseltamivir (TAMIFLU) 75 MG capsule Take 1 capsule (75 mg total) by mouth every 12 (twelve) hours. (Patient not taking: Reported on 03/22/2018) 9 capsule 0  . oxyCODONE-acetaminophen (PERCOCET) 5-325 MG tablet Take 1 tablet by mouth every 4 (four) hours as needed. (Patient not taking: Reported on 03/22/2018) 20 tablet 0  . tamsulosin (FLOMAX) 0.4 MG CAPS capsule Take 1 capsule (0.4 mg total) by mouth daily after supper. (Patient not taking: Reported on 03/22/2018) 10 capsule 0   No facility-administered medications prior to visit.      Past Medical History:  Diagnosis Date  . Depression 07/09/2016  . History of syphilis 07/09/2016  . HIV (human immunodeficiency virus infection) (HCC) 06/29/2016   Dx 06/10/2016  . Kidney stone    kidney stones     History reviewed. No pertinent surgical history.     Review of Systems  Constitutional: Negative for appetite change, chills, fatigue, fever and unexpected weight change.  Eyes: Negative for visual disturbance.  Respiratory: Negative for cough, chest tightness, shortness of breath and wheezing.   Cardiovascular: Negative for chest pain and leg swelling.  Gastrointestinal: Negative for abdominal pain, constipation, diarrhea, nausea and vomiting.  Genitourinary: Negative for dysuria, flank pain, frequency, genital sores, hematuria and urgency.  Skin: Negative for rash.  Allergic/Immunologic: Negative for immunocompromised state.  Neurological: Negative for dizziness and headaches.      Objective:    BP  119/75   Pulse 85   Temp 99.1 F (37.3 C)   Ht 5\' 11"  (1.803 m)   Wt 205 lb (93 kg)   BMI 28.59 kg/m  Nursing note and vital signs reviewed.  Physical Exam Constitutional:      General: He is not in acute distress.    Appearance: He is well-developed.  Eyes:     Conjunctiva/sclera: Conjunctivae normal.  Neck:     Musculoskeletal: Neck supple.  Cardiovascular:     Rate and Rhythm: Normal rate and regular rhythm.     Heart sounds: Normal heart sounds. No murmur. No friction rub. No gallop.   Pulmonary:     Effort: Pulmonary effort is normal. No respiratory distress.     Breath sounds: Normal breath sounds. No wheezing or rales.  Chest:     Chest wall: No tenderness.  Abdominal:     General: Bowel sounds are normal.     Palpations: Abdomen is soft.     Tenderness: There is no abdominal tenderness.  Lymphadenopathy:     Cervical: No cervical adenopathy.  Skin:    General: Skin is warm and dry.     Findings: No rash.  Neurological:     Mental Status: He is alert and oriented to person, place, and time.  Psychiatric:        Behavior: Behavior normal.        Thought Content: Thought content normal.        Judgment: Judgment normal.        Assessment & Plan:   Problem List Items Addressed This Visit      Other   HIV (human immunodeficiency virus infection) (HCC) - Primary (Chronic)    Mr. Steven [name] has waxing and waning adherence to his ART regimen of Symtuza reportedly having missed 2 doses since his last appointment, however has not refilled his medication at the pharmacy. He has no signs/symptoms of opportunistic infection or progressive HIV at present. Emphasized importance of taking medications to prevent progression of disease and reduce risk of complications in the future. Remains covered through Medicaid. Check blood work today. Continue current dose of Symtuza. Plan for follow up in 6 weeks or sooner if needed with lab work 1-2 weeks prior to appointment.        Relevant Medications   Darunavir-Cobicisctat-Emtricitabine-Tenofovir Alafenamide (SYMTUZA) 800-150-200-10 MG TABS   Other Relevant Orders   CBC   COMPLETE METABOLIC PANEL WITH GFR   HIV-1 RNA quant-no reflex-bld   T-helper cell (CD4)- ([facility] clinic only)   HIV-1 RNA quant-no reflex-bld   Comprehensive metabolic panel   Healthcare maintenance     Menveo updated today.  Discussed importance of safe sexual practice to reduce risk of acquisition or transmission of STI. Condoms provided per patient request.  Check for STI today given recent history of Chlamydia, gonorrhea and syphilis.   Working on dental screening having missed his last appointment.        Other Visit Diagnoses    Screening for STDs (sexually transmitted diseases)       Relevant Orders   RPR   Cytology (oral, anal, urethral) ancillary only   Cytology (oral, anal, urethral) ancillary only  Urine cytology ancillary only([place])   Human immunodeficiency virus (HIV) disease (HCC)       Relevant Medications   Darunavir-Cobicisctat-Emtricitabine-Tenofovir Alafenamide (SYMTUZA) 800-150-200-10 MG TABS       I have discontinued Alrick Gillingham's indomethacin, ondansetron, oxyCODONE-acetaminophen, ondansetron, tamsulosin, and oseltamivir. I am also having him maintain his Darunavir-Cobicisctat-Emtricitabine-Tenofovir Alafenamide.   Meds ordered this encounter  Medications  . Darunavir-Cobicisctat-Emtricitabine-Tenofovir Alafenamide (SYMTUZA) 800-150-200-10 MG TABS    Sig: Take 1 tablet by mouth daily with breakfast.    Dispense:  30 tablet    Refill:  2    Order Specific Question:   Supervising Provider    Answer:   Judyann Munson [[id number]]     Follow-up: Return in about 6 weeks (around 05/03/2018), or if symptoms worsen or fail to improve.   [name], MSN, FNP-C Nurse Practitioner [facility] for Infectious Disease [facility] Office phone: [phone] Pager: [phone] [facility] Main  number: [phone]

## 2018-03-23 MED FILL — SYMTUZA 800-150-200-10 MG T: 800-150-200 | 30 days supply | Qty: 30 | Fill #1

## 2018-03-24 LAB — HIV-1 RNA QUANT-NO REFLEX-BLD
HIV 1 RNA Quant: 45 copies/mL — ABNORMAL HIGH
HIV-1 RNA Quant, Log: 1.65 Log copies/mL — ABNORMAL HIGH

## 2018-03-24 LAB — COMPLETE METABOLIC PANEL WITH GFR
AG Ratio: 2 (calc) (ref 1.0–2.5)
ALT: 11 U/L (ref 9–46)
AST: 15 U/L (ref 10–40)
Albumin: 4.4 g/dL (ref 3.6–5.1)
Alkaline phosphatase (APISO): 53 U/L (ref 36–130)
BUN: 9 mg/dL (ref 7–25)
CO2: 25 mmol/L (ref 20–32)
Calcium: 9.2 mg/dL (ref 8.6–10.3)
Chloride: 107 mmol/L (ref 98–110)
Creat: 0.93 mg/dL (ref 0.60–1.35)
GFR, Est African American: 132 mL/min/{1.73_m2} (ref >=60)
GFR, Est Non African American: 114 mL/min/{1.73_m2} (ref >=60)
Globulin: 2.2 g/dL (calc) (ref 1.9–3.7)
Glucose, Bld: 86 mg/dL (ref 65–99)
Potassium: 3.9 mmol/L (ref 3.5–5.3)
Sodium: 140 mmol/L (ref 135–146)
Total Bilirubin: 0.5 mg/dL (ref 0.2–1.2)
Total Protein: 6.6 g/dL (ref 6.1–8.1)

## 2018-03-24 LAB — CYTOLOGY, (ORAL, ANAL, URETHRAL) ANCILLARY ONLY
Chlamydia: NEGATIVE
Chlamydia: POSITIVE — AB
Neisseria Gonorrhea: POSITIVE — AB
Neisseria Gonorrhea: POSITIVE — AB

## 2018-03-24 LAB — CBC
HCT: 42.3 % (ref 38.5–50.0)
Hemoglobin: 14.6 g/dL (ref 13.2–17.1)
MCH: 29.7 pg (ref 27.0–33.0)
MCHC: 34.5 g/dL (ref 32.0–36.0)
MCV: 86 fL (ref 80.0–100.0)
MPV: 11 fL (ref 7.5–12.5)
Platelets: 182 10*3/uL (ref 140–400)
RBC: 4.92 10*6/uL (ref 4.20–5.80)
RDW: 12.8 % (ref 11.0–15.0)
WBC: 3.5 10*3/uL — ABNORMAL LOW (ref 3.8–10.8)

## 2018-03-24 LAB — FLUORESCENT TREPONEMAL AB(FTA)-IGG-BLD: Fluorescent Treponemal ABS: REACTIVE — AB

## 2018-03-24 LAB — URINE CYTOLOGY ANCILLARY ONLY
Chlamydia: NEGATIVE
Neisseria Gonorrhea: NEGATIVE

## 2018-03-24 LAB — RPR: RPR Ser Ql: REACTIVE — AB

## 2018-03-24 LAB — RPR TITER: RPR Titer: 1:2 {titer} — ABNORMAL HIGH

## 2018-03-24 LAB — T-HELPER CELL (CD4) - (RCID CLINIC ONLY)
CD4 % Helper T Cell: 31 % — ABNORMAL LOW (ref 33–55)
CD4 T Cell Abs: 540 /uL (ref 400–2700)

## 2018-03-25 ENCOUNTER — Telehealth: Payer: Self-pay

## 2018-03-25 NOTE — Telephone Encounter (Signed)
Called patient per [name], to relay results. Left Rafal a message to give [place] a call back.  Will attempt to call at a later time.   Gerarda Fraction, [place]

## 2018-03-25 NOTE — Telephone Encounter (Signed)
-----   Message from Veryl Speak, FNP sent at 03/25/2018 10:57 AM EDT ----- Please inform Steven [name] that he is positive for gonorrhea and chlamydia that will need treatment. (He will need 250 mg Ceftriaxone IM once and 1 g of Azithromycin PO once).

## 2018-03-28 ENCOUNTER — Telehealth: Payer: Self-pay | Admitting: *Deleted

## 2018-03-28 ENCOUNTER — Ambulatory Visit: Payer: Self-pay | Admitting: *Deleted

## 2018-03-28 ENCOUNTER — Ambulatory Visit (INDEPENDENT_AMBULATORY_CARE_PROVIDER_SITE_OTHER): Payer: Medicaid Other | Admitting: Behavioral Health

## 2018-03-28 ENCOUNTER — Other Ambulatory Visit: Payer: Self-pay

## 2018-03-28 DIAGNOSIS — A749 Chlamydial infection, unspecified: Secondary | ICD-10-CM | POA: Diagnosis not present

## 2018-03-28 DIAGNOSIS — B20 Human immunodeficiency virus [HIV] disease: Secondary | ICD-10-CM

## 2018-03-28 DIAGNOSIS — A549 Gonococcal infection, unspecified: Secondary | ICD-10-CM | POA: Diagnosis not present

## 2018-03-28 DIAGNOSIS — A64 Unspecified sexually transmitted disease: Secondary | ICD-10-CM

## 2018-03-28 MED ORDER — AZITHROMYCIN 250 MG PO TABS
1000.0000 mg | ORAL_TABLET | Freq: Once | ORAL | Status: AC
  Administered 2018-03-28: 1000 mg via ORAL

## 2018-03-28 MED ORDER — CEFTRIAXONE SODIUM 250 MG IJ SOLR
250.0000 mg | Freq: Once | INTRAMUSCULAR | Status: AC
  Administered 2018-03-28: 250 mg via INTRAMUSCULAR

## 2018-03-28 NOTE — Progress Notes (Signed)
Patient came into today for STD treatment.  Patient tolerated Azithromycin 1 gram PO and Rocephin 250 mg IM well.  Witness by Elwyn Lade. CMA. Patient was offered condoms and accepted.  Patient informed to notify recent partners to be tested and treated at the health department.  Patient verbalized understanding. Angeline Slim RN

## 2018-03-28 NOTE — Telephone Encounter (Signed)
His labs were positive for gonorrhea (oral) and gonorrhea/chlamydia (rectal).  He will need treatment per Tammy Sours:   Notes recorded by Veryl Speak, FNP on 03/25/2018 at 10:57 AM EDT Please inform Steven [name] that he is positive for gonorrhea and chlamydia that will need treatment. (He will need 250 mg Ceftriaxone IM once and 1 g of Azithromycin PO once).  Steven Coss, RN

## 2018-03-28 NOTE — Telephone Encounter (Addendum)
Received a message over the weekend from [place] wanting to know why [facility] may be calling him. I replied to his texted msg etting him know that we need to follow up with him in relation to the test that were performed during his last visit. Without saying via text the patient understand and asked if he could come by today for his shot.

## 2018-03-29 NOTE — Progress Notes (Signed)
Prior to today's visit I received a texted message from Steven [name] inquiring about a call that he received from [facility]. After review of the chart I informed Steven [name] that we were contacting him because we need for him to f/u in our office for additional treatment in reference to the test that we performed. I cannot confirm who has access to Steven [name]'s phone so I did not want to share text results via texted messaging. Steven [name] understood and replied via text that he can come by the office today for his shot. Contacted Steven [name] and she confirmed that nursing visits are available for the patient to come in today. I followed up with Steven [name] and made him aware that we can see him in the office for treatment.   After his appt at the clinic, I followed up with Steven [name] to assess his needs and to discuss his safe sex practices. Steven [name] states his in a monogamist relationship with his partner but he believes his partner is not doing the same. Steven [name] states he has informed his partner that he is HIV positive.  Steven [name] states he is ashamed by returning to the office for reinfection of gonorrhea and chlamydia. Explained that we are a judgement free office and we want to be sure he is using condoms to prevent reinfection. Steven [name] stated he has condoms but does need lubricants. Steven [name] also stated he went to [employer] because he does not have any food and wondered if we can offer him some food. Arranged a food bag for Steven [name] along with condoms and lubricants. Traveled to his home and left the supplies at his door. I called him prior to leaving and he confimed over the phone that he received his supplies and appreciated it. Steven [name] stated he continues to take his medications each day and we discussed his great adherence for the last 2 viral loads. Steven [name] stated he will continue to stay in contact with me if he cannot get his medications or needs additional assistance.

## 2018-04-18 MED FILL — SYMTUZA 800-150-200-10 MG T: 800-150-200 | 30 days supply | Qty: 30 | Fill #2

## 2018-05-02 ENCOUNTER — Telehealth: Payer: Self-pay | Admitting: Family

## 2018-05-02 NOTE — Telephone Encounter (Signed)
COVID-19 Pre-Screening Questions: 05/02/18 ° °Do you currently have a fever (>100 °F), chills or unexplained body aches? NO  ° °Are you currently experiencing new cough, shortness of breath, sore throat, runny nose? NO  °•  °Have you recently travelled outside the state of [place] in the last 14 days? NO °•  °Have you been in contact with someone that is currently pending confirmation of Covid19 testing or has been confirmed to have the Covid19 virus? NO ° °**If the patient answers NO to ALL questions -  advise the patient to please call the clinic before coming to the office should any symptoms develop.  ° ° ° °

## 2018-05-03 ENCOUNTER — Other Ambulatory Visit: Payer: Medicaid Other

## 2018-05-11 MED FILL — SYMTUZA 800-150-200-10 MG T: 800-150-200 | 30 days supply | Qty: 30 | Fill #0

## 2018-05-16 NOTE — Progress Notes (Deleted)
   Subjective:    Patient ID: Steven [name], male    DOB: [birth year], 28 y.o.   MRN: [id number]  No chief complaint on file.    HPI:  Steven [name] is a 28 y.o. male with HIV disease and wild type virus who was last seen in the office on 03/22/18 with good tolerance and less than optimal adherence with his ART regimen of Symtuza.  Viral load at the time was undetectable at 45 with CD4 count of 540. He was found to be positive for gonorrhea and chlamydia at the time which was treated with 250 mg of Ceftriaxone IM and 1 g of Azithromycin PO.  It was recommended to follow up in 6 weeks for re-check which unfortunately was not completed. He has followed with our community nurse with good success. Healthcare maintenance due includes 2nd dose of Menveo and HPV.   No Known Allergies    Outpatient Medications Prior to Visit  Medication Sig Dispense Refill  . Darunavir-Cobicisctat-Emtricitabine-Tenofovir Alafenamide (SYMTUZA) 800-150-200-10 MG TABS Take 1 tablet by mouth daily with breakfast. 30 tablet 2   No facility-administered medications prior to visit.      Past Medical History:  Diagnosis Date  . Depression 07/09/2016  . History of syphilis 07/09/2016  . HIV (human immunodeficiency virus infection) (HCC) 06/29/2016   Dx 06/10/2016  . Kidney stone    kidney stones     No past surgical history on file.     Review of Systems    Objective:    There were no vitals taken for this visit. Nursing note and vital signs reviewed.  Physical Exam     Assessment & Plan:   Problem List Items Addressed This Visit    None       I am having [name] maintain his Darunavir-Cobicisctat-Emtricitabine-Tenofovir Alafenamide.   No orders of the defined types were placed in this encounter.    Follow-up: No follow-ups on file.   [name], MSN, FNP-C Nurse Practitioner [facility] for Infectious Disease [facility] Main number: [phone]

## 2018-05-17 ENCOUNTER — Encounter: Payer: Medicaid Other | Admitting: Family

## 2018-05-19 ENCOUNTER — Ambulatory Visit: Payer: Medicaid Other | Admitting: Family

## 2018-05-23 ENCOUNTER — Ambulatory Visit: Payer: Medicaid Other | Admitting: Family

## 2018-06-13 MED FILL — SYMTUZA 800-150-200-10 MG T: 800-150-200 | 30 days supply | Qty: 30 | Fill #1

## 2018-07-18 ENCOUNTER — Ambulatory Visit (INDEPENDENT_AMBULATORY_CARE_PROVIDER_SITE_OTHER): Payer: Medicaid Other | Admitting: Family

## 2018-07-18 ENCOUNTER — Other Ambulatory Visit (HOSPITAL_COMMUNITY)
Admission: RE | Admit: 2018-07-18 | Discharge: 2018-07-18 | Disposition: A | Discharge status: Home / Self Care | Payer: Medicaid Other | Source: Ambulatory Visit | Attending: Family | Admitting: Family

## 2018-07-18 ENCOUNTER — Other Ambulatory Visit: Payer: Self-pay

## 2018-07-18 ENCOUNTER — Telehealth: Payer: Self-pay | Admitting: *Deleted

## 2018-07-18 ENCOUNTER — Encounter: Payer: Self-pay | Admitting: Family

## 2018-07-18 VITALS — BP 123/75 | HR 93 | Temp 98.5°F | Wt 210.0 lb

## 2018-07-18 DIAGNOSIS — R198 Other specified symptoms and signs involving the digestive system and abdomen: Secondary | ICD-10-CM

## 2018-07-18 DIAGNOSIS — Z113 Encounter for screening for infections with a predominantly sexual mode of transmission: Secondary | ICD-10-CM | POA: Insufficient documentation

## 2018-07-18 DIAGNOSIS — Z21 Asymptomatic human immunodeficiency virus [HIV] infection status: Secondary | ICD-10-CM | POA: Diagnosis not present

## 2018-07-18 DIAGNOSIS — Z7252 High risk homosexual behavior: Secondary | ICD-10-CM

## 2018-07-18 DIAGNOSIS — B20 Human immunodeficiency virus [HIV] disease: Secondary | ICD-10-CM | POA: Diagnosis not present

## 2018-07-18 MED ORDER — PENICILLIN G BENZATHINE 1200000 UNIT/2ML IM SUSP
1.2000 10*6.[IU] | Freq: Once | INTRAMUSCULAR | Status: AC
  Administered 2018-07-18: 1.2 10*6.[IU] via INTRAMUSCULAR

## 2018-07-18 MED ORDER — SYMTUZA 800-150-200-10 MG PO TABS: 1.0000 | ORAL_TABLET | Freq: Every day | ORAL | 2 refills | Status: DC

## 2018-07-18 MED ORDER — AZITHROMYCIN 250 MG PO TABS
1000.0000 mg | ORAL_TABLET | Freq: Once | ORAL | Status: AC
  Administered 2018-07-18: 1000 mg via ORAL

## 2018-07-18 MED ORDER — CEFTRIAXONE SODIUM 250 MG IJ SOLR
250.0000 mg | Freq: Once | INTRAMUSCULAR | Status: AC
  Administered 2018-07-18: 250 mg via INTRAMUSCULAR

## 2018-07-18 NOTE — Patient Instructions (Signed)
Nice to see you!  We will check your blood work and swabs today.   We will treat you for syphilis, gonorrhea/chlamydia.   Please continue to take your Symtuza as prescribed.  Plan for follow up in 2 months or sooner if needed.    Preventing Sexually Transmitted Infections, Adult Sexually transmitted infections (STIs) are diseases that are passed (transmitted) from person to person through bodily fluids exchanged during sex or sexual contact. Bodily fluids include saliva, semen, blood, vaginal mucus, and urine. You may have an increased risk for developing an STI if you have unprotected oral, vaginal, or anal sex. Some common STIs include:  Herpes.  Hepatitis B.  Chlamydia.  Gonorrhea.  Syphilis.  HPV (human papillomavirus).  HIV (human immunodeficiency virus), the virus that can cause AIDS (acquired immunodeficiency syndrome). How can I protect myself from sexually transmitted infections? The only way to completely prevent STIs is not to have sex of any kind (practice abstinence). This includes oral, vaginal, or anal sex. If you are sexually active, take these actions to lower your risk of getting an STI:  Have only one sex partner (be monogamous) or limit the number of sexual partners you have.  Stay up-to-date on immunizations. Certain vaccines can lower your risk of getting certain STIs, such as: ? Hepatitis A and B vaccines. You may have been vaccinated as a young child, but likely need a booster shot as a teen or young adult. ? HPV vaccine.  Use methods that prevent the exchange of body fluids between partners (barrier protection) every time you have sex. Barrier protection can be used during oral, vaginal, or anal sex. Commonly used barrier methods include: ? Male condom. ? Male condom. ? Dental dam.  Get tested regularly for STIs. Have your sexual partner get tested regularly as well.  Avoid mixing alcohol, drugs, and sex. Alcohol and drug use can affect your  ability to make good decisions and can lead to risky sexual behaviors.  Ask your health care provider about taking pre-exposure prophylaxis (PrEP) to prevent HIV infection if you: ? Have a HIV-positive sexual partner. ? Have multiple sexual partners or partners who do not know their HIV status, and do not regularly use a condom during sex. ? Use injection drugs and share needles. Birth control pills, injections, implants, and intrauterine devices (IUDs) do not protect against STIs. To prevent both STIs and pregnancy, always use a condom with another form of birth control. Some STIs, such as herpes, are spread through skin to skin contact. A condom does not protect you from getting such STIs. If you or your partner have herpes and there is an active flare with open sores, avoid all sexual contact. Why are these changes important? Taking steps to practice safe sex protects you and others. Many STIs can be cured. However, some STIs are not curable and will affect you for the rest of your life. STIs can be passed on to another person even if you do not have symptoms. What can happen if changes are not made? Certain STIs may:  Require you to take medicine for the rest of your life.  Affect your ability to have children (your fertility).  Increase your risk for developing another STI or certain serious health conditions, such as: ? Cervical cancer. ? Head and neck cancer. ? Pelvic inflammatory disease (PID) in women. ? Organ damage or damage to other parts of your body, if the infection spreads.  Be passed to a baby during childbirth. How are  sexually transmitted infections treated? If you or your partner know or think that you may have an STI:  Talk with your health care provider about what can be done to treat it. Some STIs can be treated and cured with medicines.  For curable STIs, you and your partner should avoid sex during treatment and for several days after treatment is complete.   You and your partner should both be treated at the same time, if there is any chance that your partner is infected as well. If you get treatment but your partner does not, your partner can re-infect you when you resume sexual contact.  Do not have unprotected sex. Where to find more information Learn more about sexually transmitted diseases and infections from:  Centers for Disease Control and Prevention: ? More information about specific STIs: SolutionApps.co.zawww.cdc.gov/std ? Find places to get sexual health counseling and treatment for free or for a low cost: gettested.TonerPromos.nocdc.gov  U.S. Department of Health and Human Services: NotebookPreviews.siwww.womenshealth.gov/publications/our-publications/fact-sheet/sexually-transmitted-infections.html Summary  The only way to completely prevent STIs is not to have sex (practice abstinence), including oral, vaginal, or anal sex.  STIs can spread through saliva, semen, blood, vaginal mucus, urine, or sexual contact.  If you do have sex, limit your number of sexual partners and use a barrier protection method every time you have sex.  If you develop an STI, get treated right away and ask your partner to be treated as well. Do not resume having sex until both of you have completed treatment for the STI. This information is not intended to replace advice given to you by your health care provider. Make sure you discuss any questions you have with your health care provider. Document Released: 12/19/2015 Document Revised: 05/28/2017 Document Reviewed: 12/19/2015 Elsevier Patient Education  2020 [employer]

## 2018-07-18 NOTE — Progress Notes (Signed)
Subjective:    Patient ID: Steven [name], male    DOB: 1990-01-23, 28 y.o.   MRN: [id number]  Chief Complaint  Patient presents with  . HIV Positive/AIDS     HPI:  Steven [name] is a 28 y.o. male with HIV disease who was last seen in the office on 03/22/2018 with waxing and waning adherence to his ART regimen of Symtuza and questionable refills from the pharmacy.  Blood work at that time showed a viral load of 45 with a CD4 count of 540.  RPR titer was 1: 2.  Steven [name] continues to take his Symtuza as prescribed without missed doses per his report.  Overall feeling okay, however experiencing fatigue as well as concern for exposure to syphilis with a recent partner testing positive.  He has noted a fishy type odor from his rectum over the last 2 to 3 weeks.  He has had unprotected rectal intercourse. Denies fevers, chills, night sweats, headaches, changes in vision, neck pain/stiffness, nausea, diarrhea, vomiting, lesions or rashes.  Steven [name], Steven [name] continues to be covered through [name] and has no problems obtaining his medication from the pharmacy.  Denies feelings of being down, depressed, or hopeless recently.  No recreational or illicit drug use, tobacco use, or alcohol consumption at present.  He remains sexually active and does not use condoms consistently.  No Known Allergies    Outpatient Medications Prior to Visit  Medication Sig Dispense Refill  . Darunavir-Cobicisctat-Emtricitabine-Tenofovir Alafenamide (SYMTUZA) 800-150-200-10 MG TABS Take 1 tablet by mouth daily with breakfast. 30 tablet 2   No facility-administered medications prior to visit.      Past Medical History:  Diagnosis Date  . Depression 07/09/2016  . History of syphilis 07/09/2016  . HIV (human immunodeficiency virus infection) ([place]) 06/29/2016   Dx 06/10/2016  . Kidney stone    kidney stones     History reviewed. No pertinent surgical history.     Review of Systems  Constitutional: Negative for  appetite change, chills, fatigue, fever and unexpected weight change.  Eyes: Negative for visual disturbance.  Respiratory: Negative for cough, chest tightness, shortness of breath and wheezing.   Cardiovascular: Negative for chest pain and leg swelling.  Gastrointestinal: Negative for abdominal pain, constipation, diarrhea, nausea and vomiting.  Genitourinary: Negative for dysuria, flank pain, frequency, genital sores, hematuria and urgency.  Skin: Negative for rash.  Allergic/Immunologic: Negative for immunocompromised state.  Neurological: Negative for dizziness and headaches.      Objective:    BP 123/75   Pulse 93   Temp 98.5 F (36.9 C)   Wt 210 lb (95.3 kg)   BMI 29.29 kg/m  Nursing note and vital signs reviewed.  Physical Exam Constitutional:      General: He is not in acute distress.    Appearance: He is well-developed.  Eyes:     Conjunctiva/sclera: Conjunctivae normal.  Neck:     Musculoskeletal: Neck supple.  Cardiovascular:     Rate and Rhythm: Normal rate and regular rhythm.     Heart sounds: Normal heart sounds. No murmur. No friction rub. No gallop.   Pulmonary:     Effort: Pulmonary effort is normal. No respiratory distress.     Breath sounds: Normal breath sounds. No wheezing or rales.  Chest:     Chest wall: No tenderness.  Abdominal:     General: Bowel sounds are normal.     Palpations: Abdomen is soft.     Tenderness: There is no abdominal tenderness.  Lymphadenopathy:  Cervical: No cervical adenopathy.  Skin:    General: Skin is warm and dry.     Findings: No rash.  Neurological:     Mental Status: He is alert and oriented to person, place, and time.  Psychiatric:        Behavior: Behavior normal.        Thought Content: Thought content normal.        Judgment: Judgment normal.        Assessment & Plan:   Problem List Items Addressed This Visit      Other   HIV (human immunodeficiency virus infection) (HCC) (Chronic)    Ms.  Morries has questionable adherence to her ART regimen of Symtuza. No signs/symptoms of opportunistic infection or progressive HIV disease. Has no problems obtaining medications. Check blood work today. Continue current dose of Symtuza. Plan for follow up in 2 months or sooner if needed.      Relevant Medications   cefTRIAXone (ROCEPHIN) injection 250 mg   azithromycin (ZITHROMAX) tablet 1,000 mg   Darunavir-Cobicisctat-Emtricitabine-Tenofovir Alafenamide (SYMTUZA) 800-150-200-10 MG TABS   High risk homosexual behavior    Steven [name]. Discussed importance of safe sexual practice to reduce risk of acquisition/transmission of STI. Condoms provided. Will treat with ceftraixone 250 mg IM once, 1 g azithromycin PO once, and 2.4 million units of Bicillin. Follow up pending blood work results.       Relevant Medications   cefTRIAXone (ROCEPHIN) injection 250 mg   azithromycin (ZITHROMAX) tablet 1,000 mg   penicillin g benzathine (BICILLIN LA) 1200000 UNIT/2ML injection 1.2 Million Units   penicillin g benzathine (BICILLIN LA) 1200000 UNIT/2ML injection 1.2 Million Units    Other Visit Diagnoses    Screening for STDs (sexually transmitted diseases)    -  Primary   Relevant Orders   Cytology (oral, anal, urethral) ancillary only   Cytology (oral, anal, urethral) ancillary only   RPR   Urine cytology ancillary only([place])   Human immunodeficiency virus (HIV) disease (HCC)       Relevant Medications   cefTRIAXone (ROCEPHIN) injection 250 mg   azithromycin (ZITHROMAX) tablet 1,000 mg   Darunavir-Cobicisctat-Emtricitabine-Tenofovir Alafenamide (SYMTUZA) 800-150-200-10 MG TABS   Other Relevant Orders   COMPLETE METABOLIC PANEL WITH GFR   T-helper cell (CD4)- ([facility] clinic only)   HIV-1 RNA quant-no reflex-bld   Rectal discharge       Relevant Medications   cefTRIAXone (ROCEPHIN)  injection 250 mg   azithromycin (ZITHROMAX) tablet 1,000 mg   penicillin g benzathine (BICILLIN LA) 1200000 UNIT/2ML injection 1.2 Million Units   penicillin g benzathine (BICILLIN LA) 1200000 UNIT/2ML injection 1.2 Million Units       I am having Steven [name] maintain his Symtuza. We will continue to administer cefTRIAXone, azithromycin, penicillin g benzathine, and penicillin g benzathine.   Meds ordered this encounter  Medications  . cefTRIAXone (ROCEPHIN) injection 250 mg  . azithromycin (ZITHROMAX) tablet 1,000 mg  . penicillin g benzathine (BICILLIN LA) 1200000 UNIT/2ML injection 1.2 Million Units  . penicillin g benzathine (BICILLIN LA) 1200000 UNIT/2ML injection 1.2 Million Units  . Darunavir-Cobicisctat-Emtricitabine-Tenofovir Alafenamide (SYMTUZA) 800-150-200-10 MG TABS    Sig: Take 1 tablet by mouth daily with breakfast.    Dispense:  30 tablet    Refill:  2    Order Specific Question:   Supervising Provider    Answer:   Judyann MunsonSNIDER, CYNTHIA [phone][[id number]]  Follow-up: Return in about 2 months (around 09/18/2018), or if symptoms worsen or fail to improve.   Steven EkeGreg Analyn Matusek, MSN, FNP-C Nurse Practitioner [facility] Main number: [phone]

## 2018-07-18 NOTE — Addendum Note (Signed)
Addended by: Dolan Amen D on: 07/18/2018 05:33 PM   Modules accepted: Orders

## 2018-07-18 NOTE — Assessment & Plan Note (Signed)
Steven [name] continues to be at high risk for STD transmission now with concern for syphilis and likely gonorrhea/chlamydia. Discussed importance of safe sexual practice to reduce risk of acquisition/transmission of STI. Condoms provided. Will treat with ceftraixone 250 mg IM once, 1 g azithromycin PO once, and 2.4 million units of Bicillin. Follow up pending blood work results.

## 2018-07-18 NOTE — Telephone Encounter (Signed)
Call received from Christie/[facility] pharm, and she asked if I could reach out to [facility] and ensure he has his medications.  Call placed to [facility] appears to be in great spirits. He does state he is concerned that he may have syphilis and would like to be tested. He had several episodes of unprotected and feels remorseful for not making better choices.   Noted in the chart that patient no showed for Marya Amsler in May. Appt made for today with Marya Amsler at 4:30 with transportation made as well. Informed Gabreil of the appt and to please renew his ADAP during today's visit.

## 2018-07-18 NOTE — Assessment & Plan Note (Signed)
[name] has questionable adherence to her ART regimen of Symtuza. No signs/symptoms of opportunistic infection or progressive HIV disease. Has no problems obtaining medications. Check blood work today. Continue current dose of Symtuza. Plan for follow up in 2 months or sooner if needed.

## 2018-07-19 LAB — T-HELPER CELL (CD4) - (RCID CLINIC ONLY)
CD4 % Helper T Cell: 29 % — ABNORMAL LOW (ref 33–65)
CD4 T Cell Abs: 741 /uL (ref 400–1790)

## 2018-07-20 LAB — CYTOLOGY, (ORAL, ANAL, URETHRAL) ANCILLARY ONLY
Chlamydia: NEGATIVE
Chlamydia: NEGATIVE
Neisseria Gonorrhea: NEGATIVE
Neisseria Gonorrhea: NEGATIVE

## 2018-07-21 LAB — COMPLETE METABOLIC PANEL WITH GFR
AG Ratio: 1.8 (calc) (ref 1.0–2.5)
ALT: 24 U/L (ref 9–46)
AST: 45 U/L — ABNORMAL HIGH (ref 10–40)
Albumin: 4.6 g/dL (ref 3.6–5.1)
Alkaline phosphatase (APISO): 46 U/L (ref 36–130)
BUN: 19 mg/dL (ref 7–25)
CO2: 25 mmol/L (ref 20–32)
Calcium: 9.6 mg/dL (ref 8.6–10.3)
Chloride: 104 mmol/L (ref 98–110)
Creat: 1.05 mg/dL (ref 0.60–1.35)
GFR, Est African American: 114 mL/min/{1.73_m2} (ref >=60)
GFR, Est Non African American: 98 mL/min/{1.73_m2} (ref >=60)
Globulin: 2.5 g/dL (calc) (ref 1.9–3.7)
Glucose, Bld: 94 mg/dL (ref 65–99)
Potassium: 4 mmol/L (ref 3.5–5.3)
Sodium: 137 mmol/L (ref 135–146)
Total Bilirubin: 0.6 mg/dL (ref 0.2–1.2)
Total Protein: 7.1 g/dL (ref 6.1–8.1)

## 2018-07-21 LAB — RPR TITER: RPR Titer: 1:1 {titer} — ABNORMAL HIGH

## 2018-07-21 LAB — HIV-1 RNA QUANT-NO REFLEX-BLD
HIV 1 RNA Quant: <20 copies/mL — AB
HIV-1 RNA Quant, Log: <1.3 Log copies/mL — AB

## 2018-07-21 LAB — FLUORESCENT TREPONEMAL AB(FTA)-IGG-BLD: Fluorescent Treponemal ABS: REACTIVE — AB

## 2018-07-21 LAB — RPR: RPR Ser Ql: REACTIVE — AB

## 2018-07-25 MED FILL — SYMTUZA 800-150-200-10 MG T: 800-150-200 | 30 days supply | Qty: 30 | Fill #2

## 2018-08-24 MED FILL — SYMTUZA 800-150-200-10 MG T: 800-150-200 | 30 days supply | Qty: 30 | Fill #0

## 2018-08-25 ENCOUNTER — Telehealth: Payer: Self-pay | Admitting: *Deleted

## 2018-08-25 NOTE — Telephone Encounter (Signed)
Patient did not answer return call message left for him to call the office.

## 2018-08-25 NOTE — Telephone Encounter (Signed)
Agree this is likely a virus and continue with hydration with sports drinks/electrolyte drinks. Modify diet as need with bland light foods and progress as tolerated. If he develops further symptoms to including fever, change in smell, or shortness of breath to please let us know.

## 2018-08-25 NOTE — Telephone Encounter (Signed)
Patient called to report that after being constipated for 3 days he is now having watery 3 times today. He reports stomach ache bad, runny nose, no cough and no fever. He did not take anything to induce a bowel movement and it just concerned he may have a virus. Advised him to try not to eat anything that might make him go extra, stay hydrated and I will ask provider if there is anything he can do to or should look out for as if it is a virus it will run its course.

## 2018-09-05 ENCOUNTER — Other Ambulatory Visit: Payer: Medicaid Other

## 2018-09-06 ENCOUNTER — Emergency Department (HOSPITAL_COMMUNITY)
Admission: EM | Admit: 2018-09-06 | Discharge: 2018-09-06 | Disposition: A | Discharge status: Home / Self Care | Payer: Medicaid Other | Attending: Emergency Medicine | Admitting: Emergency Medicine

## 2018-09-06 ENCOUNTER — Encounter (HOSPITAL_COMMUNITY): Payer: Self-pay | Admitting: Emergency Medicine

## 2018-09-06 ENCOUNTER — Telehealth: Payer: Self-pay | Admitting: *Deleted

## 2018-09-06 ENCOUNTER — Emergency Department (HOSPITAL_COMMUNITY): Payer: Medicaid Other

## 2018-09-06 ENCOUNTER — Other Ambulatory Visit: Payer: Self-pay

## 2018-09-06 DIAGNOSIS — R112 Nausea with vomiting, unspecified: Secondary | ICD-10-CM | POA: Insufficient documentation

## 2018-09-06 DIAGNOSIS — R0789 Other chest pain: Secondary | ICD-10-CM | POA: Diagnosis not present

## 2018-09-06 DIAGNOSIS — R1013 Epigastric pain: Secondary | ICD-10-CM | POA: Diagnosis not present

## 2018-09-06 DIAGNOSIS — Z21 Asymptomatic human immunodeficiency virus [HIV] infection status: Secondary | ICD-10-CM | POA: Diagnosis not present

## 2018-09-06 DIAGNOSIS — Z79899 Other long term (current) drug therapy: Secondary | ICD-10-CM | POA: Diagnosis not present

## 2018-09-06 DIAGNOSIS — R42 Dizziness and giddiness: Secondary | ICD-10-CM | POA: Diagnosis not present

## 2018-09-06 DIAGNOSIS — F17210 Nicotine dependence, cigarettes, uncomplicated: Secondary | ICD-10-CM | POA: Insufficient documentation

## 2018-09-06 DIAGNOSIS — K920 Hematemesis: Secondary | ICD-10-CM | POA: Diagnosis not present

## 2018-09-06 DIAGNOSIS — R197 Diarrhea, unspecified: Secondary | ICD-10-CM | POA: Diagnosis not present

## 2018-09-06 LAB — TROPONIN I (HIGH SENSITIVITY): Troponin I (High Sensitivity): 3 ng/L (ref <18)

## 2018-09-06 LAB — CBC
HCT: 44.3 % (ref 39.0–52.0)
Hemoglobin: 15.1 g/dL (ref 13.0–17.0)
MCH: 30.4 pg (ref 26.0–34.0)
MCHC: 34.1 g/dL (ref 30.0–36.0)
MCV: 89.1 fL (ref 80.0–100.0)
Platelets: 148 10*3/uL — ABNORMAL LOW (ref 150–400)
RBC: 4.97 MIL/uL (ref 4.22–5.81)
RDW: 12.4 % (ref 11.5–15.5)
WBC: 5.3 10*3/uL (ref 4.0–10.5)
nRBC: 0 % (ref 0.0–0.2)

## 2018-09-06 LAB — COMPREHENSIVE METABOLIC PANEL
ALT: 19 U/L (ref 0–44)
AST: 19 U/L (ref 15–41)
Albumin: 4.5 g/dL (ref 3.5–5.0)
Alkaline Phosphatase: 49 U/L (ref 38–126)
Anion gap: 10 (ref 5–15)
BUN: 7 mg/dL (ref 6–20)
CO2: 26 mmol/L (ref 22–32)
Calcium: 9 mg/dL (ref 8.9–10.3)
Chloride: 100 mmol/L (ref 98–111)
Creatinine, Ser: 0.93 mg/dL (ref 0.61–1.24)
GFR calc Af Amer: >60 mL/min (ref >60)
GFR calc non Af Amer: >60 mL/min (ref >60)
Glucose, Bld: 99 mg/dL (ref 70–99)
Potassium: 3.5 mmol/L (ref 3.5–5.1)
Sodium: 136 mmol/L (ref 135–145)
Total Bilirubin: 0.6 mg/dL (ref 0.3–1.2)
Total Protein: 7.7 g/dL (ref 6.5–8.1)

## 2018-09-06 LAB — URINALYSIS, ROUTINE W REFLEX MICROSCOPIC
Bilirubin Urine: NEGATIVE
Glucose, UA: NEGATIVE mg/dL
Hgb urine dipstick: NEGATIVE
Ketones, ur: NEGATIVE mg/dL
Leukocytes,Ua: NEGATIVE
Nitrite: NEGATIVE
Protein, ur: NEGATIVE mg/dL
Specific Gravity, Urine: 1.008 (ref 1.005–1.030)
pH: 8 (ref 5.0–8.0)

## 2018-09-06 LAB — LIPASE, BLOOD: Lipase: 30 U/L (ref 11–51)

## 2018-09-06 MED ORDER — ONDANSETRON 4 MG PO TBDP: 4.0000 mg | ORAL_TABLET | Freq: Three times a day (TID) | ORAL | 0 refills | Status: DC | PRN

## 2018-09-06 MED ORDER — ALUM & MAG HYDROXIDE-SIMETH 200-200-20 MG/5ML PO SUSP
30.0000 mL | Freq: Once | ORAL | Status: AC
  Administered 2018-09-06: 30 mL via ORAL
  Filled 2018-09-06: qty 30

## 2018-09-06 MED ORDER — ONDANSETRON HCL 4 MG/2ML IJ SOLN
4.0000 mg | Freq: Once | INTRAMUSCULAR | Status: AC
  Administered 2018-09-06: 4 mg via INTRAVENOUS
  Filled 2018-09-06: qty 2

## 2018-09-06 MED ORDER — SODIUM CHLORIDE 0.9% FLUSH: 3.0000 mL | Freq: Once | INTRAVENOUS | Status: DC

## 2018-09-06 MED ORDER — SODIUM CHLORIDE 0.9 % IV BOLUS
1000.0000 mL | Freq: Once | INTRAVENOUS | Status: AC
  Administered 2018-09-06: 14:00:00 1000 mL via INTRAVENOUS

## 2018-09-06 NOTE — ED Triage Notes (Signed)
Per [facility] pt from home for abd pains, n/v/d for over week. Pt c/o chest pains all over for over week as well.

## 2018-09-06 NOTE — ED Notes (Signed)
Pt d/c home per MD order. Discharge summary reviewed with pt, pt verbalizes understanding. No complaints at discharge , ambulatory off unit. No s/s of distress noted.

## 2018-09-06 NOTE — ED Provider Notes (Signed)
[facility] DEPT Provider Note   CSN: [id number] Arrival date & time: 09/06/18  1006     History   Chief Complaint Chief Complaint  Patient presents with  . Chest Pain  . Emesis  . Diarrhea  . Abdominal Pain    HPI Steven [name] is a 28 y.o. male.     28yo M w/ PMH including HIV on ART, kidney stones who p/w N/V/D and chest pain. Last week, he began having vomiting associated w/ pain going from his epigastrium into chest, diarrhea, nausea, and dizziness. Last episode of diarrhea was just PTA and last vomiting was this morning. He has had occasional blood streaks in emesis. No associated cough, fever, or SOB. No known sick contacts. He took tylenol without relief. CP is constant x 1 week and worse with certain movements, better if he holds still. He is compliant w/ ART and last viral load undetectable.   The history is provided by the patient.  Chest Pain Associated symptoms: abdominal pain and vomiting   Emesis Associated symptoms: abdominal pain and diarrhea   Diarrhea Associated symptoms: abdominal pain and vomiting   Abdominal Pain Associated symptoms: chest pain, diarrhea and vomiting     Past Medical History:  Diagnosis Date  . Depression 07/09/2016  . History of syphilis 07/09/2016  . HIV (human immunodeficiency virus infection) ([place]) 06/29/2016   Dx 06/10/2016  . Kidney stone    kidney stones    Patient Active Problem List   Diagnosis Date Noted  . High risk homosexual behavior 07/18/2018  . Constipation 12/18/2016  . Sore throat 12/18/2016  . Exposure to gonorrhea 08/25/2016  . Healthcare maintenance 08/25/2016  . History of syphilis 07/09/2016  . Outbursts of anger 07/09/2016  . HIV (human immunodeficiency virus infection) ([place]) 06/29/2016    History reviewed. No pertinent surgical history.      Home Medications    Prior to Admission medications   Medication Sig Start Date End Date Taking? Authorizing Provider   Darunavir-Cobicisctat-Emtricitabine-Tenofovir Alafenamide (SYMTUZA) 800-150-200-10 MG TABS Take 1 tablet by mouth daily with breakfast. 07/18/18  Yes Golden Circle, FNP  polyvinyl alcohol (LIQUIFILM TEARS) 1.4 % ophthalmic solution Place 1 drop into both eyes as needed for dry eyes.   Yes [provider]  ondansetron (ZOFRAN ODT) 4 MG disintegrating tablet Take 1 tablet (4 mg total) by mouth every 8 (eight) hours as needed for nausea or vomiting. 09/06/18   Jameal Razzano, Wenda Overland, MD    Family History Family History  Problem Relation Age of Onset  . Mental illness Mother     Social History Social History   Tobacco Use  . Smoking status: Current Some Day Smoker    Packs/day: 0.50    Types: Cigarettes  . Smokeless tobacco: Never Used  Substance Use Topics  . Alcohol use: Yes    Comment: occasional  . Drug use: Yes    Types: Marijuana     Allergies   Patient has no known allergies.   Review of Systems Review of Systems  Cardiovascular: Positive for chest pain.  Gastrointestinal: Positive for abdominal pain, diarrhea and vomiting.   All other systems reviewed and are negative except that which was mentioned in HPI   Physical Exam Updated Vital Signs BP (!) 144/95   Pulse 63   Temp 98.4 F (36.9 C) (Oral)   Resp 16   SpO2 100%   Physical Exam Vitals signs and nursing note reviewed.  Constitutional:  General: He is not in acute distress.    Appearance: He is well-developed.  HENT:     Head: Normocephalic and atraumatic.  Eyes:     Conjunctiva/sclera: Conjunctivae normal.  Neck:     Musculoskeletal: Neck supple.  Cardiovascular:     Rate and Rhythm: Normal rate and regular rhythm.     Heart sounds: Normal heart sounds. No murmur.  Pulmonary:     Effort: Pulmonary effort is normal.     Breath sounds: Normal breath sounds.  Abdominal:     General: Bowel sounds are normal. There is no distension.     Palpations: Abdomen is soft.     Tenderness:  There is no abdominal tenderness.  Skin:    General: Skin is warm and dry.  Neurological:     Mental Status: He is alert and oriented to person, place, and time.     Comments: Fluent speech  Psychiatric:        Judgment: Judgment normal.     Comments: pleasant      ED Treatments / Results  Labs (all labs ordered are listed, but only abnormal results are displayed) Labs Reviewed  CBC - Abnormal; Notable for the following components:      Result Value   Platelets 148 (*)    All other components within normal limits  URINALYSIS, ROUTINE W REFLEX MICROSCOPIC - Abnormal; Notable for the following components:   Color, Urine STRAW (*)    All other components within normal limits  LIPASE, BLOOD  COMPREHENSIVE METABOLIC PANEL  TROPONIN I (HIGH SENSITIVITY)    EKG EKG Interpretation  Date/Time:  Tuesday September 06 2018 10:17:24 EDT Ventricular Rate:  65 PR Interval:    QRS Duration: 83 QT Interval:  364 QTC Calculation: 379 R Axis:   69 Text Interpretation:  Sinus rhythm No significant change since last tracing Confirmed by Frederick Peers [phone]) on 09/06/2018 1:01:56 PM   Radiology Dg Chest 2 View  Result Date: 09/06/2018 CLINICAL DATA:  Chest pain 1 week.  HIV. EXAM: CHEST - 2 VIEW COMPARISON:  02/01/2018 FINDINGS: The heart size and mediastinal contours are within normal limits. Both lungs are clear. The visualized skeletal structures are unremarkable. IMPRESSION: No active cardiopulmonary disease. Electronically Signed   By: Marlan Palau M.D.   On: 09/06/2018 14:48    Procedures Procedures (including critical care time)  Medications Ordered in ED Medications  sodium chloride flush (NS) 0.9 % injection 3 mL (has no administration in time range)  ondansetron (ZOFRAN) injection 4 mg (4 mg Intravenous Given 09/06/18 1359)  sodium chloride 0.9 % bolus 1,000 mL (0 mLs Intravenous Stopped 09/06/18 1500)  alum & mag hydroxide-simeth (MAALOX/MYLANTA) 200-200-20 MG/5ML suspension  30 mL (30 mLs Oral Given 09/06/18 1359)     Initial Impression / Assessment and Plan / ED Course  I have reviewed the triage vital signs and the nursing notes.  Pertinent labs & imaging results that were available during my care of the patient were reviewed by me and considered in my medical decision making (see chart for details).       Non toxic on exam, reassuring VS, no abd tenderness.  EKG reassuring, chest pain has been constant and sounds non-cardiac in nature, highly doubt ACS, dissection, or PE.  Troponin negative.  Lab work unremarkable including normal CMP, CBC, and lipase.  Patient felt much better on reassessment.  I have provided with Zofran to use as needed at home, counseled on supportive measures, and recommended starting probiotics  to help with diarrhea.  I have extensively reviewed return precautions with him and he voiced understanding.  Final Clinical Impressions(s) / ED Diagnoses   Final diagnoses:  Nausea vomiting and diarrhea  Atypical chest pain    ED Discharge Orders         Ordered    ondansetron (ZOFRAN ODT) 4 MG disintegrating tablet  Every 8 hours PRN     09/06/18 1520           Frona Yost, Ambrose Finlandachel Morgan, MD 09/06/18 1545

## 2018-09-06 NOTE — Telephone Encounter (Signed)
Received a call from [facility] stating he is current in the hospital after a really bad episode of gastric reflux. Bayne is concerned because he missed his lab appt on yesterday. Advised him that we may be able to get the lab to draw or needed lab work since transportation is a issue for him. Then he should plan to see Marya Amsler on the 28th of September (9am). During this call Dajuan and I reviewed his last lab work and education provided on what viral load and CD4 means.

## 2018-09-06 NOTE — ED Notes (Signed)
per MD ok to not draw second Troponin

## 2018-09-07 NOTE — Telephone Encounter (Signed)
Received note. I was unable to locate him as currently on the hospitalized list. We can certainly draw his blood work when he is seen here in the office. Thanks.

## 2018-09-27 IMAGING — DX DG LUMBAR SPINE COMPLETE 4+V
5 series · 5 of 5 positions shown · non-contrast
Comparison: KUB dated 02/06/2015.

CLINICAL DATA: Sudden onset persisted right-sided low back pain
radiating down NG leg beginning yesterday. History of
nephrolithiasis.

EXAM:
LUMBAR SPINE - COMPLETE 4+ VIEW

[l-spine ap]
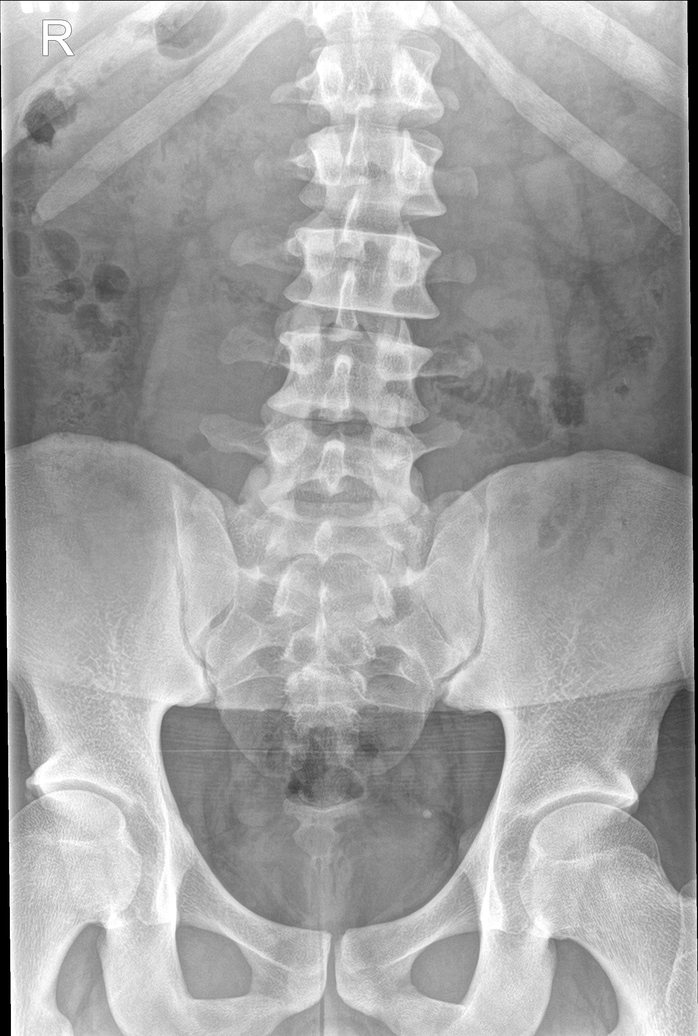

[l-spine obl (1 of 2)]
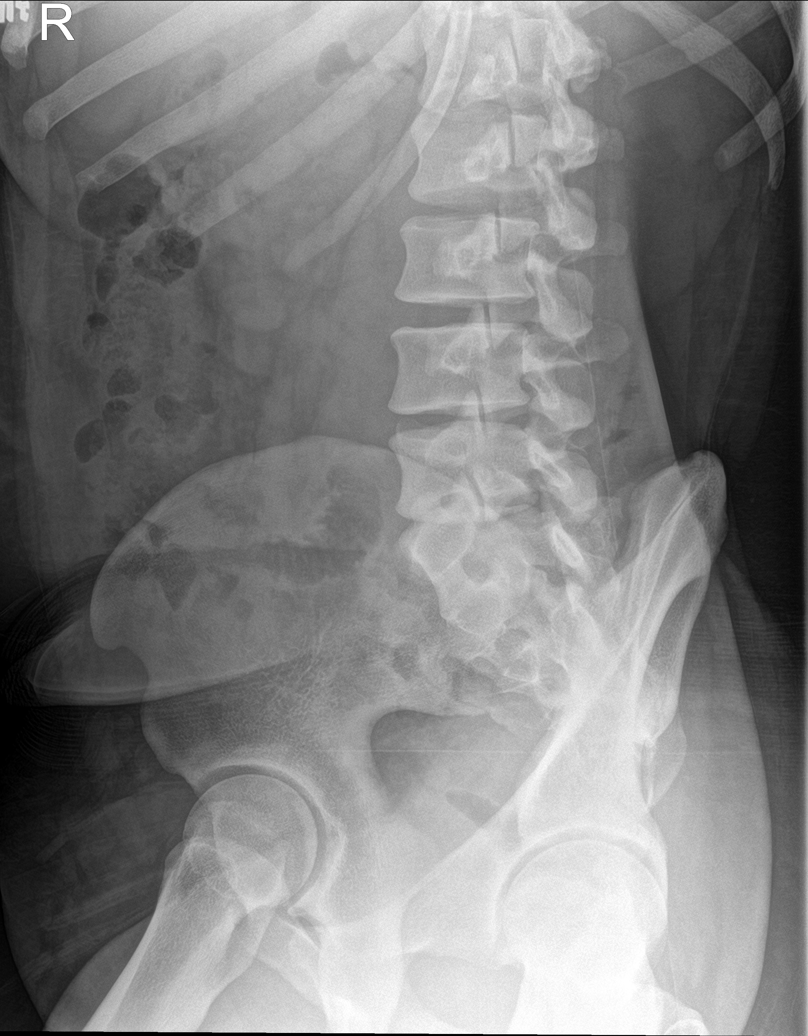

[l-spine obl (2 of 2)]
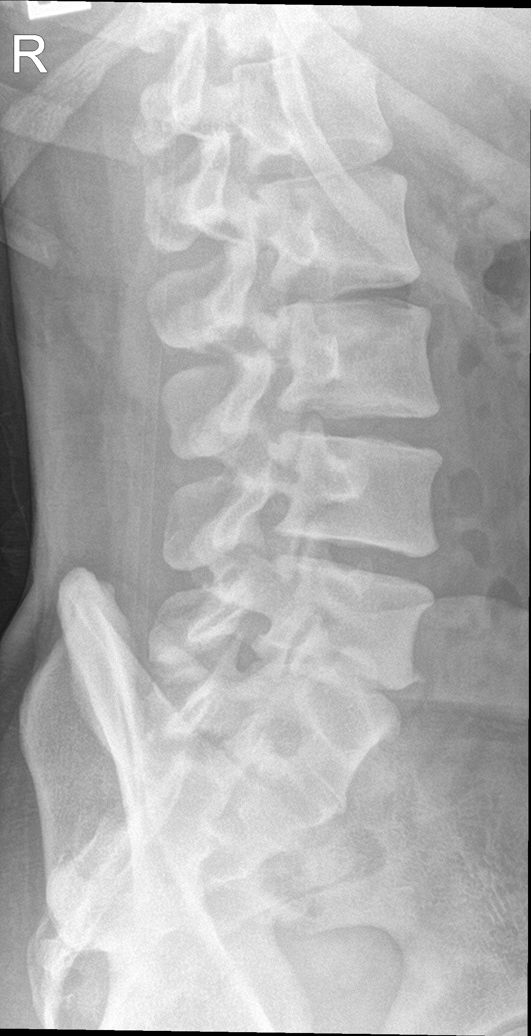

[l-spine lat]
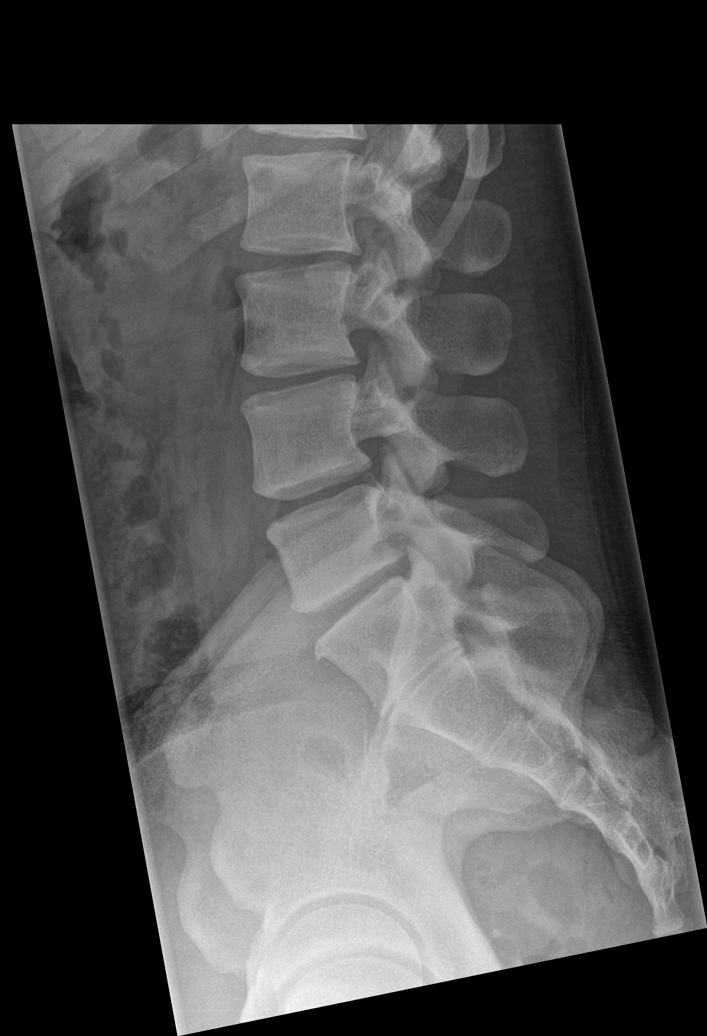

[l-spine spot]
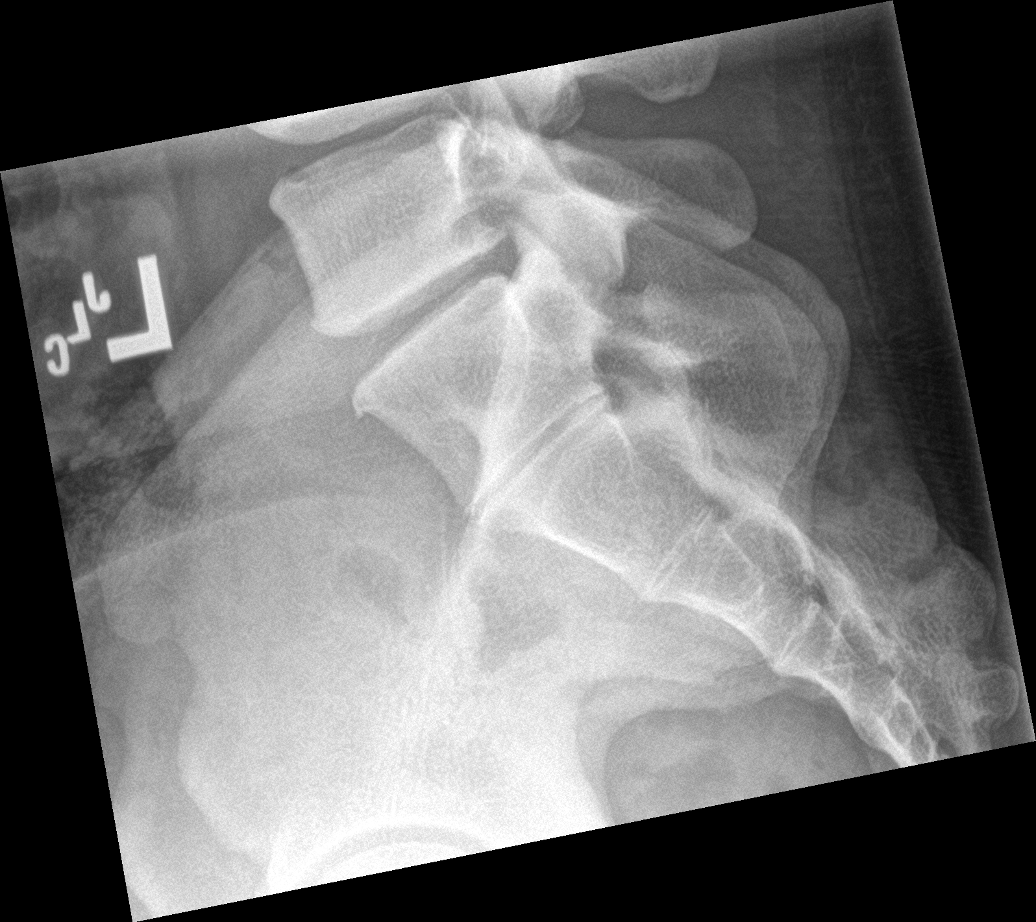

[5 of 5 positions shown; findings below may reference images not displayed]

FINDINGS: Minimal levoscoliosis is stable. No acute or suspicious osseous
lesion. No significant degenerative change. No evidence of pars
interarticularis defect.

Paravertebral soft tissues are unremarkable. Small calcification in
the left lower pelvis is stable, compatible with benign phlebolith.
IMPRESSION: No acute or significant findings.

## 2018-10-03 ENCOUNTER — Encounter: Payer: Medicaid Other | Admitting: Family

## 2018-10-10 ENCOUNTER — Ambulatory Visit: Payer: Medicaid Other | Admitting: Family

## 2018-10-12 ENCOUNTER — Ambulatory Visit: Payer: Medicaid Other | Admitting: Family

## 2018-10-17 ENCOUNTER — Ambulatory Visit (INDEPENDENT_AMBULATORY_CARE_PROVIDER_SITE_OTHER): Payer: Medicaid Other | Admitting: Pharmacist

## 2018-10-17 ENCOUNTER — Telehealth: Payer: Self-pay | Admitting: *Deleted

## 2018-10-17 ENCOUNTER — Other Ambulatory Visit: Payer: Self-pay

## 2018-10-17 ENCOUNTER — Other Ambulatory Visit (HOSPITAL_COMMUNITY)
Admission: RE | Admit: 2018-10-17 | Discharge: 2018-10-17 | Disposition: A | Discharge status: Home / Self Care | Payer: Medicaid Other | Source: Ambulatory Visit | Attending: Family | Admitting: Family

## 2018-10-17 DIAGNOSIS — Z7252 High risk homosexual behavior: Secondary | ICD-10-CM | POA: Diagnosis not present

## 2018-10-17 DIAGNOSIS — Z113 Encounter for screening for infections with a predominantly sexual mode of transmission: Secondary | ICD-10-CM

## 2018-10-17 DIAGNOSIS — B20 Human immunodeficiency virus [HIV] disease: Secondary | ICD-10-CM

## 2018-10-17 MED ORDER — SYMTUZA 800-150-200-10 MG PO TABS: 1.0000 | ORAL_TABLET | Freq: Every day | ORAL | 3 refills | Status: DC

## 2018-10-17 MED FILL — SYMTUZA 800-150-200-10 MG T: 800-150-200 | 30 days supply | Qty: 30 | Fill #1

## 2018-10-17 NOTE — Progress Notes (Signed)
HPI: Steven [name] is a 28 y.o. male who presents to the [place] clinic for HIV follow-up.  Patient Active Problem List   Diagnosis Date Noted  . High risk homosexual behavior 07/18/2018  . Constipation 12/18/2016  . Sore throat 12/18/2016  . Exposure to gonorrhea 08/25/2016  . Healthcare maintenance 08/25/2016  . History of syphilis 07/09/2016  . Outbursts of anger 07/09/2016  . HIV (human immunodeficiency virus infection) ([place]) 06/29/2016    Patient's Medications  New Prescriptions   No medications on file  Previous Medications   ONDANSETRON (ZOFRAN ODT) 4 MG DISINTEGRATING TABLET    Take 1 tablet (4 mg total) by mouth every 8 (eight) hours as needed for nausea or vomiting.   POLYVINYL ALCOHOL (LIQUIFILM TEARS) 1.4 % OPHTHALMIC SOLUTION    Place 1 drop into both eyes as needed for dry eyes.  Modified Medications   Modified Medication Previous Medication   DARUNAVIR-COBICISCTAT-EMTRICITABINE-TENOFOVIR ALAFENAMIDE (SYMTUZA) 800-150-200-10 MG TABS Darunavir-Cobicisctat-Emtricitabine-Tenofovir Alafenamide (SYMTUZA) 800-150-200-10 MG TABS      Take 1 tablet by mouth daily with breakfast.    Take 1 tablet by mouth daily with breakfast.  Discontinued Medications   No medications on file    Allergies: No Known Allergies  Past Medical History: Past Medical History:  Diagnosis Date  . Depression 07/09/2016  . History of syphilis 07/09/2016  . HIV (human immunodeficiency virus infection) ([place]) 06/29/2016   Dx 06/10/2016  . Kidney stone    kidney stones    Social History: Social History   Socioeconomic History  . Marital status: Single    Spouse name: Not on file  . Number of children: Not on file  . Years of education: Not on file  . Highest education level: Not on file  Occupational History  . Not on file  Social Needs  . Financial resource strain: Not on file  . Food insecurity    Worry: Not on file    Inability: Not on file  . Transportation needs   Medical: Not on file    Non-medical: Not on file  Tobacco Use  . Smoking status: Current Some Day Smoker    Packs/day: 0.50    Types: Cigarettes  . Smokeless tobacco: Never Used  Substance and Sexual Activity  . Alcohol use: Yes    Comment: occasional  . Drug use: Yes    Types: Marijuana  . Sexual activity: Yes    Partners: Male    Birth control/protection: Condom  Lifestyle  . Physical activity    Days per week: Not on file    Minutes per session: Not on file  . Stress: Not on file  Relationships  . Social Herbalist on phone: Not on file    Gets together: Not on file    Attends religious service: Not on file    Active member of club or organization: Not on file    Attends meetings of clubs or organizations: Not on file    Relationship status: Not on file  Other Topics Concern  . Not on file  Social History Narrative  . Not on file    Labs: Lab Results  Component Value Date   HIV1RNAQUANT 40 (H) 10/17/2018   HIV1RNAQUANT <20 DETECTED (A) 07/18/2018   HIV1RNAQUANT 45 (H) 03/22/2018   CD4TABS 741 07/18/2018   CD4TABS 540 03/22/2018   CD4TABS 700 12/17/2017    RPR and STI Lab Results  Component Value Date   LABRPR REACTIVE (A) 10/17/2018   LABRPR  REACTIVE (A) 07/18/2018   LABRPR REACTIVE (A) 03/22/2018   LABRPR REACTIVE (A) 12/17/2017   LABRPR NON-REACTIVE 01/28/2017   RPRTITER 1:2 (H) 10/17/2018   RPRTITER 1:1 (H) 07/18/2018   RPRTITER 1:2 (H) 03/22/2018   RPRTITER 1:8 (H) 12/17/2017   RPRTITER 1:1 (H) 12/18/2016    STI Results GC CT  07/18/2018 Negative Negative  07/18/2018 Negative Negative  03/22/2018 Negative Negative  03/22/2018 **POSITIVE**(A) **POSITIVE**(A)  03/22/2018 **POSITIVE**(A) Negative  12/17/2017 Negative Negative  12/17/2017 Negative Negative  12/17/2017 Negative **POSITIVE**(A)  01/28/2017 Negative Negative  12/18/2016 Negative Negative  12/18/2016 Negative Negative  12/18/2016 Negative Negative  08/25/2016 Negative  Negative  08/25/2016 **POSITIVE**(A) Negative  07/09/2016 Negative Negative  07/09/2016 Negative Negative  06/10/2016 Negative Negative    Hepatitis B Lab Results  Component Value Date   HEPBSAB NEG 07/09/2016   HEPBSAG NON-REACTIVE 12/17/2017   Hepatitis C Lab Results  Component Value Date   HEPCAB NON-REACTIVE 12/17/2017   Hepatitis A Lab Results  Component Value Date   HAV NON-REACTIVE 12/17/2017   Lipids: Lab Results  Component Value Date   CHOL 135 12/17/2017   TRIG 33 12/17/2017   HDL 56 12/17/2017   CHOLHDL 2.4 12/17/2017   VLDL 6 07/09/2016   LDLCALC 70 12/17/2017    Current HIV Regimen: Symtuza  Assessment: Steven [name] is here today to follow-up for his HIV infection after having several no shows with Tammy SoursGreg and our providers.  He is currently prescribed Symtuza. At his last visit with Tammy SoursGreg in July, he was having some fatigue and had an exposure to syphilis. He also noted a fishy type odor from his rectum. He was treated with ceftriaxone + azithromycin for gonorrhea and chlamydia and also given Bicillin for exposure to syphilis.   He states that he is feeling well today and states that he is back in school at [employer].  He is still fatigued but is contributing that to his recent return to school and going to classes.  He is still having a slight smell from his rectum but no discharge or itching.  He continues to tell me that he takes his Symtuza every day but [facility] not sent him any medication since mid-August and he states that he does not go anywhere else for his refills. He does have an undetectable HIV viral load though, so I'm not sure how that is. I will tell the pharmacy to mail him his medications today.   He does say that he keeps his Symtuza in a pill box that we gave him. He puts 2 pills in each day of the week and uses it for 2 weeks.  I asked him why he doesn't just put one and he states that it just works for him. He states that he never  doubles up on doses and I warned him about the issue with that (increased risk of side effects).   He needs his flu shot, 2nd Menveo, and 3rd HPV vaccine but left today before I could give them to him.  Hopefully he will show up for his next appointment and we can administer them then.   I will check all labs today including STIs and have him see Tammy SoursGreg in ~3 months. I re-educated him about our no show policy and asked that he cancel/reschedule in advance if he cannot show.  We will get transportation scheduled for him again for the next visit.  I discussed this with Johaura, and he is also working with Molli PoseyAmbre.  Plan: - Continue Symtuza PO once daily with food - [facility] will mail to his house today - HIV viral load, urine/rectal swabs for STIs, RPR today - F/u with Tammy Sours 1/4 at 3pm  Keiera Strathman L. Eyvonne Burchfield, PharmD, BCIDP, AAHIVP, CPP Infectious Diseases Clinical Pharmacist [facility] 10/20/2018, 9:37 AM

## 2018-10-17 NOTE — Telephone Encounter (Signed)
Received a call from [facility] stating he has not received a confirmation for his transportation and his appt is at 10:45.  Call placed to Allison/Transportation she will follow up with [place] to question why a driver has not been assigned to [place]. The current time is 09:50 am and the patient has a ETA for 10:07am.   Call returned to [place] with update given.

## 2018-10-19 LAB — HIV-1 RNA QUANT-NO REFLEX-BLD
HIV 1 RNA Quant: 40 copies/mL — ABNORMAL HIGH
HIV-1 RNA Quant, Log: 1.6 Log copies/mL — ABNORMAL HIGH

## 2018-10-19 LAB — RPR: RPR Ser Ql: REACTIVE — AB

## 2018-10-19 LAB — RPR TITER: RPR Titer: 1:2 {titer} — ABNORMAL HIGH

## 2018-10-19 LAB — FLUORESCENT TREPONEMAL AB(FTA)-IGG-BLD: Fluorescent Treponemal ABS: REACTIVE — AB

## 2018-10-21 LAB — CYTOLOGY, (ORAL, ANAL, URETHRAL) ANCILLARY ONLY
Chlamydia: NEGATIVE
Comment: NEGATIVE
Comment: NORMAL
Neisseria Gonorrhea: POSITIVE — AB

## 2018-10-24 ENCOUNTER — Telehealth: Payer: Self-pay | Admitting: *Deleted

## 2018-10-24 NOTE — Telephone Encounter (Signed)
Thank you :)

## 2018-10-24 NOTE — Telephone Encounter (Signed)
-----   Message from Darletta Moll, [place] sent at 10/24/2018 10:28 AM EDT ----- Can you please call [place] and schedule him for a nurse visit to get treatment for rectal gonorrhea Marya Amsler and [name]'s patient).  He will need - ceftriaxone 250 mg IM x 1 + azithromycin 1 gm PO x 1. He may need transportation set up as well (he had it set up when he saw me last week). Thank you so much!

## 2018-10-24 NOTE — Progress Notes (Signed)
Can you please call [place] and schedule him for a nurse visit to get treatment for rectal gonorrhea Marya Amsler and [name]'s patient).  He will need - ceftriaxone 250 mg IM x 1 + azithromycin 1 gm PO x 1. He may need transportation set up as well (he had it set up when he saw me last week). Thank you so much!

## 2018-10-24 NOTE — Telephone Encounter (Signed)
Relayed results to patient, transferred call to [place] for assistance in scheduling appointment and transportation. Landis Gandy, RN

## 2018-10-25 ENCOUNTER — Ambulatory Visit (INDEPENDENT_AMBULATORY_CARE_PROVIDER_SITE_OTHER): Payer: Medicaid Other

## 2018-10-25 ENCOUNTER — Other Ambulatory Visit: Payer: Self-pay

## 2018-10-25 DIAGNOSIS — A549 Gonococcal infection, unspecified: Secondary | ICD-10-CM | POA: Diagnosis present

## 2018-10-25 DIAGNOSIS — B20 Human immunodeficiency virus [HIV] disease: Secondary | ICD-10-CM

## 2018-10-25 MED ORDER — AZITHROMYCIN 250 MG PO TABS
1000.0000 mg | ORAL_TABLET | Freq: Once | ORAL | Status: AC
  Administered 2018-10-25: 1000 mg via ORAL

## 2018-10-25 MED ORDER — CEFTRIAXONE SODIUM 250 MG IJ SOLR
250.0000 mg | Freq: Once | INTRAMUSCULAR | Status: AC
  Administered 2018-10-25: 16:00:00 250 mg via INTRAMUSCULAR

## 2018-10-25 NOTE — Progress Notes (Signed)
Patient in office today for rectal gonorrhea treatment.  Per Cassie Kuppelweiser he ceftriaxone 250 mg IM x 1 + azithromycin 1 gm PO x 1. Patient provided with condoms and advised to make partner aware so they can be tested and treated at the HD. Patient tolerated medication well. No questions/concerns voiced.  Eugenia Mcalpine

## 2018-12-07 MED FILL — SYMTUZA 800-150-200-10 MG T: 800-150-200 | 30 days supply | Qty: 30 | Fill #2

## 2018-12-19 ENCOUNTER — Other Ambulatory Visit: Payer: Self-pay

## 2018-12-19 ENCOUNTER — Encounter (HOSPITAL_COMMUNITY): Payer: Self-pay | Admitting: Emergency Medicine

## 2018-12-19 ENCOUNTER — Emergency Department (HOSPITAL_COMMUNITY)
Admission: EM | Admit: 2018-12-19 | Discharge: 2018-12-19 | Disposition: A | Discharge status: Home / Self Care | Payer: Medicaid Other | Attending: Emergency Medicine | Admitting: Emergency Medicine

## 2018-12-19 DIAGNOSIS — Z5321 Procedure and treatment not carried out due to patient leaving prior to being seen by health care provider: Secondary | ICD-10-CM | POA: Diagnosis not present

## 2018-12-19 DIAGNOSIS — R0789 Other chest pain: Secondary | ICD-10-CM | POA: Insufficient documentation

## 2018-12-19 DIAGNOSIS — R197 Diarrhea, unspecified: Secondary | ICD-10-CM | POA: Diagnosis present

## 2018-12-19 NOTE — ED Notes (Signed)
Called for room x3 in waiting room.. no answer

## 2018-12-19 NOTE — ED Triage Notes (Signed)
PT. STATED, IVE HAD DIARRHEA, CHEST PAIN, AND HEADACHE SINCE THANKSGIVING, AND I THINK I HAVE ALL THE SYMPTOMS OF THE COVID

## 2019-01-09 ENCOUNTER — Ambulatory Visit: Payer: Medicaid Other | Admitting: Family

## 2019-01-09 MED FILL — SYMTUZA 800-150-200-10 MG T: 800-150-200 | 30 days supply | Qty: 30 | Fill #0

## 2019-01-10 ENCOUNTER — Telehealth: Payer: Self-pay

## 2019-01-10 NOTE — Telephone Encounter (Signed)
Attempted to call patient regarding missed appointment with [name], NP on 01/09/2019. No identifying voicemail noted on answering service. No voicemail left. If patient returns call he need to reschedule missed appointment.  Steven [name]

## 2019-01-11 ENCOUNTER — Ambulatory Visit: Payer: Medicaid Other | Admitting: Family

## 2019-01-11 ENCOUNTER — Ambulatory Visit: Payer: Medicaid Other | Admitting: *Deleted

## 2019-01-11 ENCOUNTER — Telehealth: Payer: Self-pay | Admitting: *Deleted

## 2019-01-11 NOTE — Telephone Encounter (Signed)
Call received from [facility] stating he has an appt today but does not have transportation or money for bus fare.   Call placed to [facility], transportation arranged for 3:00.   Return call to [facility] with the ETA and stated I will be present for his visit today

## 2019-01-17 NOTE — Progress Notes (Signed)
Spoke with the patient with plans to meet at the clinic during his office visit. Transportation was arranged but patient "no showed" for his visit. Unable to reach him by phone.

## 2019-01-20 ENCOUNTER — Telehealth: Payer: Self-pay | Admitting: Family

## 2019-01-20 NOTE — Telephone Encounter (Signed)
Patient called complaining that his nurse had not been in touch with him to see how he is doing and to remind him of his appointments.  He is saying that he doesn't need one now and he is planning on changing and going to another office for care.

## 2019-01-23 NOTE — Telephone Encounter (Addendum)
I really hate to hear that. He is not my patient but I do help him when I can. Tammy Sours and I waited for him during his last scheduled appt. That was after he called me stating he did not have transportation so I arranged transportation for him and then he "no showed".

## 2019-02-08 MED FILL — SYMTUZA 800-150-200-10 MG T: 800-150-200 | 30 days supply | Qty: 30 | Fill #1

## 2019-03-05 ENCOUNTER — Ambulatory Visit (HOSPITAL_COMMUNITY)
Admission: AD | Admit: 2019-03-05 | Discharge: 2019-03-05 | Disposition: A | Discharge status: Home / Self Care | Payer: Medicaid Other | Attending: Psychiatry | Admitting: Psychiatry

## 2019-03-05 DIAGNOSIS — F419 Anxiety disorder, unspecified: Secondary | ICD-10-CM | POA: Insufficient documentation

## 2019-03-05 DIAGNOSIS — F329 Major depressive disorder, single episode, unspecified: Secondary | ICD-10-CM | POA: Insufficient documentation

## 2019-03-05 DIAGNOSIS — R450 Nervousness: Secondary | ICD-10-CM | POA: Diagnosis not present
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
## 2019-03-17 MED FILL — SYMTUZA 800-150-200-10 MG T: 800-150-200 | 30 days supply | Qty: 30 | Fill #2

## 2019-03-18 ENCOUNTER — Other Ambulatory Visit: Payer: Self-pay

## 2019-03-18 ENCOUNTER — Emergency Department (HOSPITAL_COMMUNITY): Payer: Medicaid Other

## 2019-03-18 ENCOUNTER — Encounter (HOSPITAL_COMMUNITY): Payer: Self-pay | Admitting: Emergency Medicine

## 2019-03-18 ENCOUNTER — Emergency Department (HOSPITAL_COMMUNITY)
Admission: EM | Admit: 2019-03-18 | Discharge: 2019-03-18 | Disposition: A | Discharge status: Home / Self Care | Payer: Medicaid Other | Attending: Emergency Medicine | Admitting: Emergency Medicine

## 2019-03-18 DIAGNOSIS — S0083XA Contusion of other part of head, initial encounter: Secondary | ICD-10-CM | POA: Diagnosis not present

## 2019-03-18 DIAGNOSIS — B20 Human immunodeficiency virus [HIV] disease: Secondary | ICD-10-CM | POA: Diagnosis not present

## 2019-03-18 DIAGNOSIS — F17210 Nicotine dependence, cigarettes, uncomplicated: Secondary | ICD-10-CM | POA: Insufficient documentation

## 2019-03-18 DIAGNOSIS — S0181XA Laceration without foreign body of other part of head, initial encounter: Secondary | ICD-10-CM | POA: Diagnosis not present

## 2019-03-18 DIAGNOSIS — W540XXA Bitten by dog, initial encounter: Secondary | ICD-10-CM | POA: Diagnosis not present

## 2019-03-18 DIAGNOSIS — Y9389 Activity, other specified: Secondary | ICD-10-CM | POA: Diagnosis not present

## 2019-03-18 DIAGNOSIS — Y92512 Supermarket, store or market as the place of occurrence of the external cause: Secondary | ICD-10-CM | POA: Diagnosis not present

## 2019-03-18 DIAGNOSIS — Z23 Encounter for immunization: Secondary | ICD-10-CM | POA: Diagnosis not present

## 2019-03-18 DIAGNOSIS — Y999 Unspecified external cause status: Secondary | ICD-10-CM | POA: Diagnosis not present

## 2019-03-18 DIAGNOSIS — S0990XA Unspecified injury of head, initial encounter: Secondary | ICD-10-CM | POA: Diagnosis present

## 2019-03-18 LAB — COMPREHENSIVE METABOLIC PANEL
ALT: 27 U/L (ref 0–44)
AST: 22 U/L (ref 15–41)
Albumin: 4 g/dL (ref 3.5–5.0)
Alkaline Phosphatase: 50 U/L (ref 38–126)
Anion gap: 9 (ref 5–15)
BUN: 7 mg/dL (ref 6–20)
CO2: 24 mmol/L (ref 22–32)
Calcium: 9 mg/dL (ref 8.9–10.3)
Chloride: 104 mmol/L (ref 98–111)
Creatinine, Ser: 0.88 mg/dL (ref 0.61–1.24)
GFR calc Af Amer: >60 mL/min (ref >60)
GFR calc non Af Amer: >60 mL/min (ref >60)
Glucose, Bld: 87 mg/dL (ref 70–99)
Potassium: 3.8 mmol/L (ref 3.5–5.1)
Sodium: 137 mmol/L (ref 135–145)
Total Bilirubin: 0.5 mg/dL (ref 0.3–1.2)
Total Protein: 6.3 g/dL — ABNORMAL LOW (ref 6.5–8.1)

## 2019-03-18 LAB — CBC WITH DIFFERENTIAL/PLATELET
Abs Immature Granulocytes: 0.07 10*3/uL (ref 0.00–0.07)
Basophils Absolute: 0.1 10*3/uL (ref 0.0–0.1)
Basophils Relative: 1 %
Eosinophils Absolute: 0.2 10*3/uL (ref 0.0–0.5)
Eosinophils Relative: 2 %
HCT: 43.7 % (ref 39.0–52.0)
Hemoglobin: 15.1 g/dL (ref 13.0–17.0)
Immature Granulocytes: 1 %
Lymphocytes Relative: 28 %
Lymphs Abs: 2.2 10*3/uL (ref 0.7–4.0)
MCH: 30.2 pg (ref 26.0–34.0)
MCHC: 34.6 g/dL (ref 30.0–36.0)
MCV: 87.4 fL (ref 80.0–100.0)
Monocytes Absolute: 0.6 10*3/uL (ref 0.1–1.0)
Monocytes Relative: 8 %
Neutro Abs: 4.8 10*3/uL (ref 1.7–7.7)
Neutrophils Relative %: 60 %
Platelets: 180 10*3/uL (ref 150–400)
RBC: 5 MIL/uL (ref 4.22–5.81)
RDW: 12.3 % (ref 11.5–15.5)
WBC: 7.9 10*3/uL (ref 4.0–10.5)
nRBC: 0 % (ref 0.0–0.2)

## 2019-03-18 MED ORDER — TETANUS-DIPHTH-ACELL PERTUSSIS 5-2.5-18.5 LF-MCG/0.5 IM SUSP: 0.5000 mL | Freq: Once | INTRAMUSCULAR | Status: DC

## 2019-03-18 MED ORDER — MORPHINE SULFATE (PF) 4 MG/ML IV SOLN
4.0000 mg | Freq: Once | INTRAVENOUS | Status: AC
  Administered 2019-03-18: 4 mg via INTRAVENOUS
  Filled 2019-03-18: qty 1

## 2019-03-18 MED ORDER — TETANUS-DIPHTH-ACELL PERTUSSIS 5-2.5-18.5 LF-MCG/0.5 IM SUSP
0.5000 mL | Freq: Once | INTRAMUSCULAR | Status: AC
  Administered 2019-03-18: 0.5 mL via INTRAMUSCULAR
  Filled 2019-03-18: qty 0.5

## 2019-03-18 MED ORDER — LIDOCAINE-EPINEPHRINE (PF) 2 %-1:200000 IJ SOLN
20.0000 mL | Freq: Once | INTRAMUSCULAR | Status: AC
  Administered 2019-03-18: 20 mL via INTRADERMAL
  Filled 2019-03-18: qty 20

## 2019-03-18 MED ORDER — IBUPROFEN 400 MG PO TABS
600.0000 mg | ORAL_TABLET | Freq: Once | ORAL | Status: AC
  Administered 2019-03-18: 600 mg via ORAL
  Filled 2019-03-18: qty 1

## 2019-03-18 MED ORDER — AMOXICILLIN-POT CLAVULANATE 875-125 MG PO TABS: 1.0000 | ORAL_TABLET | Freq: Two times a day (BID) | ORAL | 0 refills | Status: DC

## 2019-03-18 NOTE — ED Notes (Signed)
All appropriate discharge materials reviewed at length with patient. Time for questions provided. Pt has no other questions at this time and verbalizes understanding of all provided materials.  

## 2019-03-18 NOTE — Discharge Instructions (Addendum)
Take Ibuprofen or Tylenol for pain Apply ice to the hand and face to help with swelling Take Augmentin twice daily for 5 days to prevent infection Keep area clean by washing with soap and water daily Apply a new bandage at least once daily, change more often if it is dirty Watch for signs of infection (redness, drainage, worsening pain) Have stitches removed in 5 days
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
## 2019-03-18 NOTE — ED Notes (Signed)
Pt went to x-ray.
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
## 2019-03-24 ENCOUNTER — Ambulatory Visit (HOSPITAL_COMMUNITY)
Admission: EM | Admit: 2019-03-24 | Discharge: 2019-03-24 | Disposition: A | Discharge status: Home / Self Care | Payer: Medicaid Other

## 2019-03-24 ENCOUNTER — Other Ambulatory Visit: Payer: Medicaid Other

## 2019-03-24 ENCOUNTER — Other Ambulatory Visit: Payer: Self-pay | Admitting: *Deleted

## 2019-03-24 ENCOUNTER — Other Ambulatory Visit: Payer: Self-pay

## 2019-03-24 DIAGNOSIS — B20 Human immunodeficiency virus [HIV] disease: Secondary | ICD-10-CM

## 2019-03-24 LAB — T-HELPER CELL (CD4) - (RCID CLINIC ONLY)
CD4 % Helper T Cell: 32 % — ABNORMAL LOW (ref 33–65)
CD4 T Cell Abs: 653 /uL (ref 400–1790)

## 2019-03-24 MED ORDER — BACITRACIN ZINC 500 UNIT/GM EX OINT
TOPICAL_OINTMENT | CUTANEOUS | Status: AC
  Filled 2019-03-24: qty 0.9

## 2019-03-24 NOTE — ED Notes (Signed)
Three sutures removed in entirety; thin layer of antibiotic ointment applied. Pt tolerated well.

## 2019-03-28 LAB — CBC WITH DIFFERENTIAL/PLATELET
Absolute Monocytes: 450 cells/uL (ref 200–950)
Basophils Absolute: 51 cells/uL (ref 0–200)
Basophils Relative: 0.9 %
Eosinophils Absolute: 228 cells/uL (ref 15–500)
Eosinophils Relative: 4 %
HCT: 43.3 % (ref 38.5–50.0)
Hemoglobin: 14.7 g/dL (ref 13.2–17.1)
Lymphs Abs: 2063 cells/uL (ref 850–3900)
MCH: 29.5 pg (ref 27.0–33.0)
MCHC: 33.9 g/dL (ref 32.0–36.0)
MCV: 86.9 fL (ref 80.0–100.0)
MPV: 10.2 fL (ref 7.5–12.5)
Monocytes Relative: 7.9 %
Neutro Abs: 2907 cells/uL (ref 1500–7800)
Neutrophils Relative %: 51 %
Platelets: 193 10*3/uL (ref 140–400)
RBC: 4.98 10*6/uL (ref 4.20–5.80)
RDW: 12.8 % (ref 11.0–15.0)
Total Lymphocyte: 36.2 %
WBC: 5.7 10*3/uL (ref 3.8–10.8)

## 2019-03-28 LAB — COMPLETE METABOLIC PANEL WITH GFR
AG Ratio: 1.9 (calc) (ref 1.0–2.5)
ALT: 18 U/L (ref 9–46)
AST: 13 U/L (ref 10–40)
Albumin: 4.4 g/dL (ref 3.6–5.1)
Alkaline phosphatase (APISO): 53 U/L (ref 36–130)
BUN: 17 mg/dL (ref 7–25)
CO2: 28 mmol/L (ref 20–32)
Calcium: 9.4 mg/dL (ref 8.6–10.3)
Chloride: 106 mmol/L (ref 98–110)
Creat: 1.03 mg/dL (ref 0.60–1.35)
GFR, Est African American: 116 mL/min/{1.73_m2} (ref >=60)
GFR, Est Non African American: 100 mL/min/{1.73_m2} (ref >=60)
Globulin: 2.3 g/dL (calc) (ref 1.9–3.7)
Glucose, Bld: 91 mg/dL (ref 65–99)
Potassium: 4.6 mmol/L (ref 3.5–5.3)
Sodium: 140 mmol/L (ref 135–146)
Total Bilirubin: 0.4 mg/dL (ref 0.2–1.2)
Total Protein: 6.7 g/dL (ref 6.1–8.1)

## 2019-03-28 LAB — HIV-1 RNA QUANT-NO REFLEX-BLD
HIV 1 RNA Quant: <20 copies/mL — AB
HIV-1 RNA Quant, Log: <1.3 Log copies/mL — AB

## 2019-04-06 ENCOUNTER — Ambulatory Visit: Payer: Self-pay | Admitting: Family

## 2019-04-25 ENCOUNTER — Ambulatory Visit: Payer: Medicaid Other | Admitting: Family

## 2019-04-26 ENCOUNTER — Ambulatory Visit: Payer: Medicaid Other | Admitting: Family

## 2019-05-05 ENCOUNTER — Telehealth: Payer: Self-pay
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
## 2019-05-05 NOTE — Telephone Encounter (Signed)
-----   Message from Steven [name] sent at 05/05/2019 11:23 AM EDT ----- Regarding: sick Contact: [phone] Patient thinks he has Crohn's Disease. Said that he just had a bowel movement and there was a lot of blood

## 2019-05-09 MED FILL — SYMTUZA 800-150-200-10 MG T: 800-150-200 | 30 days supply | Qty: 30 | Fill #3

## 2019-05-10 ENCOUNTER — Ambulatory Visit: Payer: Medicaid Other | Admitting: Pharmacist

## 2019-05-10 ENCOUNTER — Other Ambulatory Visit: Payer: Self-pay | Admitting: Pharmacist

## 2019-05-10 DIAGNOSIS — Z113 Encounter for screening for infections with a predominantly sexual mode of transmission: Secondary | ICD-10-CM

## 2019-05-10 DIAGNOSIS — B20 Human immunodeficiency virus [HIV] disease: Secondary | ICD-10-CM

## 2019-05-23 ENCOUNTER — Other Ambulatory Visit (HOSPITAL_COMMUNITY)
Admission: RE | Admit: 2019-05-23 | Discharge: 2019-05-23 | Disposition: A | Discharge status: Home / Self Care | Payer: Medicaid Other | Source: Ambulatory Visit | Attending: Family | Admitting: Family

## 2019-05-23 ENCOUNTER — Other Ambulatory Visit: Payer: Self-pay

## 2019-05-23 ENCOUNTER — Ambulatory Visit (INDEPENDENT_AMBULATORY_CARE_PROVIDER_SITE_OTHER): Payer: Medicaid Other | Admitting: Pharmacist

## 2019-05-23 DIAGNOSIS — B20 Human immunodeficiency virus [HIV] disease: Secondary | ICD-10-CM

## 2019-05-23 DIAGNOSIS — A549 Gonococcal infection, unspecified: Secondary | ICD-10-CM | POA: Diagnosis present

## 2019-05-23 DIAGNOSIS — Z113 Encounter for screening for infections with a predominantly sexual mode of transmission: Secondary | ICD-10-CM | POA: Insufficient documentation

## 2019-05-23 DIAGNOSIS — A539 Syphilis, unspecified: Secondary | ICD-10-CM | POA: Diagnosis not present

## 2019-05-23 MED ORDER — PENICILLIN G BENZATHINE 1200000 UNIT/2ML IM SUSP
1.2000 10*6.[IU] | Freq: Once | INTRAMUSCULAR | Status: AC
  Administered 2019-05-23: 1.2 10*6.[IU] via INTRAMUSCULAR

## 2019-05-23 MED ORDER — DOXYCYCLINE HYCLATE 100 MG PO TABS: 100.0000 mg | ORAL_TABLET | Freq: Two times a day (BID) | ORAL | 0 refills | Status: DC

## 2019-05-23 MED ORDER — CEFTRIAXONE SODIUM 500 MG IJ SOLR
500.0000 mg | Freq: Once | INTRAMUSCULAR | Status: AC
  Administered 2019-05-23: 500 mg via INTRAMUSCULAR

## 2019-05-23 MED FILL — SYMTUZA 800-150-200-10 MG T: 800-150-200 | 30 days supply | Qty: 30 | Fill #3

## 2019-05-23 NOTE — Progress Notes (Signed)
HPI: Steven [name] is a 29 y.o. male who presents to the [facility] pharmacy clinic for HIV follow-up.  Patient Active Problem List   Diagnosis Date Noted  . High risk homosexual behavior 07/18/2018  . Constipation 12/18/2016  . Sore throat 12/18/2016  . Exposure to gonorrhea 08/25/2016  . Healthcare maintenance 08/25/2016  . History of syphilis 07/09/2016  . Outbursts of anger 07/09/2016  . HIV (human immunodeficiency virus infection) ([facility]) 06/29/2016    Patient's Medications  New Prescriptions   DOXYCYCLINE (VIBRA-TABS) 100 MG TABLET    Take 1 tablet (100 mg total) by mouth 2 (two) times daily.  Previous Medications   AMOXICILLIN-CLAVULANATE (AUGMENTIN) 875-125 MG TABLET    Take 1 tablet by mouth every 12 (twelve) hours.   DARUNAVIR-COBICISCTAT-EMTRICITABINE-TENOFOVIR ALAFENAMIDE (SYMTUZA) 800-150-200-10 MG TABS    Take 1 tablet by mouth daily with breakfast.   ONDANSETRON (ZOFRAN ODT) 4 MG DISINTEGRATING TABLET    Take 1 tablet (4 mg total) by mouth every 8 (eight) hours as needed for nausea or vomiting.   POLYVINYL ALCOHOL (LIQUIFILM TEARS) 1.4 % OPHTHALMIC SOLUTION    Place 1 drop into both eyes as needed for dry eyes.  Modified Medications   No medications on file  Discontinued Medications   No medications on file    Allergies: No Known Allergies  Past Medical History: Past Medical History:  Diagnosis Date  . Depression 07/09/2016  . History of syphilis 07/09/2016  . HIV (human immunodeficiency virus infection) ([facility]) 06/29/2016   Dx 06/10/2016  . Kidney stone    kidney stones    Social History: Social History   Socioeconomic History  . Marital status: Single    Spouse name: Not on file  . Number of children: Not on file  . Years of education: Not on file  . Highest education level: Not on file  Occupational History  . Not on file  Tobacco Use  . Smoking status: Current Some Day Smoker    Packs/day: 0.50    Types: Cigarettes  . Smokeless tobacco: Never Used    Substance and Sexual Activity  . Alcohol use: Yes    Comment: occasional  . Drug use: Yes    Types: Marijuana  . Sexual activity: Yes    Partners: Male    Birth control/protection: Condom  Other Topics Concern  . Not on file  Social History Narrative  . Not on file   Social Determinants of Health   Financial Resource Strain:   . Difficulty of Paying Living Expenses:   Food Insecurity:   . Worried About Programme researcher, broadcasting/film/video in the Last Year:   . Barista in the Last Year:   Transportation Needs:   . Freight forwarder (Medical):   Marland Kitchen Lack of Transportation (Non-Medical):   Physical Activity:   . Days of Exercise per Week:   . Minutes of Exercise per Session:   Stress:   . Feeling of Stress :   Social Connections:   . Frequency of Communication with Friends and Family:   . Frequency of Social Gatherings with Friends and Family:   . Attends Religious Services:   . Active Member of Clubs or Organizations:   . Attends Banker Meetings:   Marland Kitchen Marital Status:     Labs: Lab Results  Component Value Date   HIV1RNAQUANT <20 DETECTED (A) 03/24/2019   HIV1RNAQUANT 40 (H) 10/17/2018   HIV1RNAQUANT <20 DETECTED (A) 07/18/2018   CD4TABS 653 03/24/2019   CD4TABS  741 07/18/2018   CD4TABS 540 03/22/2018    RPR and STI Lab Results  Component Value Date   LABRPR REACTIVE (A) 10/17/2018   LABRPR REACTIVE (A) 07/18/2018   LABRPR REACTIVE (A) 03/22/2018   LABRPR REACTIVE (A) 12/17/2017   LABRPR NON-REACTIVE 01/28/2017   RPRTITER 1:2 (H) 10/17/2018   RPRTITER 1:1 (H) 07/18/2018   RPRTITER 1:2 (H) 03/22/2018   RPRTITER 1:8 (H) 12/17/2017   RPRTITER 1:1 (H) 12/18/2016    STI Results GC CT  10/17/2018 Positive(A) Negative  07/18/2018 Negative Negative  07/18/2018 Negative Negative  03/22/2018 Negative Negative  03/22/2018 **POSITIVE**(A) **POSITIVE**(A)  03/22/2018 **POSITIVE**(A) Negative  12/17/2017 Negative Negative  12/17/2017 Negative Negative   12/17/2017 Negative **POSITIVE**(A)  01/28/2017 Negative Negative  12/18/2016 Negative Negative  12/18/2016 Negative Negative  12/18/2016 Negative Negative  08/25/2016 Negative Negative  08/25/2016 **POSITIVE**(A) Negative  07/09/2016 Negative Negative  07/09/2016 Negative Negative  06/10/2016 Negative Negative    Hepatitis B Lab Results  Component Value Date   HEPBSAB NEG 07/09/2016   HEPBSAG NON-REACTIVE 12/17/2017   Hepatitis C Lab Results  Component Value Date   HEPCAB NON-REACTIVE 12/17/2017   Hepatitis A Lab Results  Component Value Date   HAV NON-REACTIVE 12/17/2017   Lipids: Lab Results  Component Value Date   CHOL 135 12/17/2017   TRIG 33 12/17/2017   HDL 56 12/17/2017   CHOLHDL 2.4 12/17/2017   VLDL 6 07/09/2016   LDLCALC 70 12/17/2017    Current HIV Regimen: Symtuza  Assessment: Steven [name] is here after missing multiple appointments to follow up for his HIV care. He is currently taking Symtuza and reports being adherent, taking his medication everyday without missing any doses. He was undetectable in March of 2021 with a CD4 count 653 which does speak to good adherence. He has no issues tolerating the medication and is here to pick up his next months supply. It has been shipped to his house in the past from the [facility] but he is worried about it being stolen so he wanted to pick it up in person this time.   He does complain of white penile discharge and rash around his groin area that is uncomfortable. He has painful urination and overall discomfort at baseline which he believes is due to chlamydia. He has had 1 partner in the past 3 months which he does not use condoms with and he believes he may have been exposed to an STD. We will give him some condoms to use at today's visit. He does have a history of STD's and given his symptoms we will treat him here in clinic for gonorrhea, chlamydia, and syphilis. We will get an RPR, and urine/rectal/oral  cytologies today to confirm the diagnosis.   He is also due for a few vaccines, his last dose of the Menveo and HPV series and he also needs to start the Hepatitis A and B series. He just received the [place] vaccine on the 7th of May and it is not recommended to receive any other vaccines for two weeks after administration so we will not be able to give him any vaccines today. His second dose is on the 25th of May so we will be able to administer other vaccines at his follow up visit in June.    Plan: -Symtuza 1 month supply -Ceftriaxone 500mg  x1, doxycycline 100mg  BID x7 days, Bicillin 2.4 million units x1 -Urine/rectal/oral cytology, RPR, HIV RNA -F/u with Cassie 07/03/2019  Nicoletta Dress, PharmD PGY2 Infectious Disease  Pharmacy Resident  [facility] 05/23/2019, 10:05 AM

## 2019-05-24 LAB — CYTOLOGY, (ORAL, ANAL, URETHRAL) ANCILLARY ONLY
Chlamydia: NEGATIVE
Chlamydia: NEGATIVE
Comment: NEGATIVE
Comment: NEGATIVE
Comment: NORMAL
Comment: NORMAL
Neisseria Gonorrhea: NEGATIVE
Neisseria Gonorrhea: POSITIVE — AB

## 2019-05-25 LAB — RPR: RPR Ser Ql: REACTIVE — AB

## 2019-05-25 LAB — HIV-1 RNA QUANT-NO REFLEX-BLD
HIV 1 RNA Quant: <20 copies/mL — AB
HIV-1 RNA Quant, Log: <1.3 Log copies/mL — AB

## 2019-05-25 LAB — RPR TITER: RPR Titer: 1:8 {titer} — ABNORMAL HIGH

## 2019-05-25 LAB — FLUORESCENT TREPONEMAL AB(FTA)-IGG-BLD: Fluorescent Treponemal ABS: REACTIVE — AB

## 2019-06-04 ENCOUNTER — Emergency Department (HOSPITAL_COMMUNITY)
Admission: EM | Admit: 2019-06-04 | Discharge: 2019-06-04 | Disposition: A | Discharge status: Home / Self Care | Payer: Medicaid Other | Attending: Emergency Medicine | Admitting: Emergency Medicine

## 2019-06-04 ENCOUNTER — Other Ambulatory Visit: Payer: Self-pay

## 2019-06-04 DIAGNOSIS — B20 Human immunodeficiency virus [HIV] disease: Secondary | ICD-10-CM | POA: Insufficient documentation

## 2019-06-04 DIAGNOSIS — Z720 Tobacco use: Secondary | ICD-10-CM | POA: Insufficient documentation

## 2019-06-04 DIAGNOSIS — L299 Pruritus, unspecified: Secondary | ICD-10-CM | POA: Insufficient documentation

## 2019-06-04 MED ORDER — PERMETHRIN 5 % EX CREA: TOPICAL_CREAM | CUTANEOUS | 0 refills | Status: DC

## 2019-06-04 NOTE — ED Notes (Signed)
Patient verbalizes understanding of discharge instructions . Opportunity for questions and answers were provided . Armband removed by staff ,Pt discharged from ED. W/C  offered at D/C  and Declined W/C at D/C and was escorted to lobby by RN.  

## 2019-06-04 NOTE — Discharge Instructions (Signed)
Use Permethrin shampoo as directed.   Please follow up with your primary care provider within 5-7 days for re-evaluation of your symptoms. If you do not have a primary care provider, information for a healthcare clinic has been provided for you to make arrangements for follow up care. Please return to the emergency department for any new or worsening symptoms.

## 2019-06-04 NOTE — ED Triage Notes (Signed)
Patient complains of scalp itching and thinks he has parasites in scalp. Nothing noted in triage, complains of burning to same

## 2019-06-04 NOTE — ED Provider Notes (Signed)
[facility] EMERGENCY DEPARTMENT Provider Note   CSN: [id number] Arrival date & time: 06/04/19  1357     History Chief Complaint  Patient presents with  . Pruritus    Steven [name] is a 29 y.o. male.  HPI   29 year old male with a history of depression, syphilis, HIV, nephrolithiasis, who presents to the emergency department today for evaluation due to concern for possible lice.  He states that he thinks his dog has lice. He denies that his dog has fleas. He has had itching to his head and burning sensation today.  He denies any fevers or other symptoms.  Past Medical History:  Diagnosis Date  . Depression 07/09/2016  . History of syphilis 07/09/2016  . HIV (human immunodeficiency virus infection) ([place]) 06/29/2016   Dx 06/10/2016  . Kidney stone    kidney stones    Patient Active Problem List   Diagnosis Date Noted  . High risk homosexual behavior 07/18/2018  . Constipation 12/18/2016  . Sore throat 12/18/2016  . Exposure to gonorrhea 08/25/2016  . Healthcare maintenance 08/25/2016  . History of syphilis 07/09/2016  . Outbursts of anger 07/09/2016  . HIV (human immunodeficiency virus infection) ([place]) 06/29/2016    No past surgical history on file.     Family History  Problem Relation Age of Onset  . Mental illness Mother     Social History   Tobacco Use  . Smoking status: Current Some Day Smoker    Packs/day: 0.50    Types: Cigarettes  . Smokeless tobacco: Never Used  Substance Use Topics  . Alcohol use: Yes    Comment: occasional  . Drug use: Yes    Types: Marijuana    Home Medications Prior to Admission medications   Medication Sig Start Date End Date Taking? Authorizing Provider  amoxicillin-clavulanate (AUGMENTIN) 875-125 MG tablet Take 1 tablet by mouth every 12 (twelve) hours. 03/18/19   Recardo Evangelist, PA-C  Darunavir-Cobicisctat-Emtricitabine-Tenofovir Alafenamide (SYMTUZA) 800-150-200-10 MG TABS Take 1 tablet by mouth  daily with breakfast. 10/17/18   Kuppelweiser, Cassie L, RPH-CPP  doxycycline (VIBRA-TABS) 100 MG tablet Take 1 tablet (100 mg total) by mouth 2 (two) times daily. 05/23/19   Kuppelweiser, Cassie L, RPH-CPP  ondansetron (ZOFRAN ODT) 4 MG disintegrating tablet Take 1 tablet (4 mg total) by mouth every 8 (eight) hours as needed for nausea or vomiting. 09/06/18   Little, Wenda Overland, MD  permethrin (ELIMITE) 5 % cream Apply to affected area once and leave on for 10 minutes. Repeat this in 9 days. 06/04/19   Adylin Hankey S, PA-C  polyvinyl alcohol (LIQUIFILM TEARS) 1.4 % ophthalmic solution Place 1 drop into both eyes as needed for dry eyes.    [provider]    Allergies    Patient has no known allergies.  Review of Systems   Review of Systems  Constitutional: Negative for fever.  Skin: Negative for color change and wound.       Pruritus to head  Neurological: Negative for headaches.    Physical Exam Updated Vital Signs BP 138/82 (BP Location: Right Arm)   Pulse (!) 106   Temp 98.6 F (37 C) (Oral)   Resp 18   Ht 5\' 11"  (1.803 m)   Wt 79.4 kg   SpO2 99%   BMI 24.41 kg/m   Physical Exam Constitutional:      General: He is not in acute distress.    Appearance: He is well-developed.  HENT:     Head:  Normocephalic and atraumatic.     Comments: No obvious lice or nits to hair. No rashes to the head and no open wounds. Eyes:     Conjunctiva/sclera: Conjunctivae normal.  Cardiovascular:     Rate and Rhythm: Normal rate.  Pulmonary:     Effort: Pulmonary effort is normal.  Skin:    General: Skin is warm and dry.  Neurological:     Mental Status: He is alert and oriented to person, place, and time.  Psychiatric:     Comments: anxious     ED Results / Procedures / Treatments   Labs (all labs ordered are listed, but only abnormal results are displayed) Labs Reviewed - No data to display  EKG None  Radiology No results found.  Procedures Procedures  (including critical care time)  Medications Ordered in ED Medications - No data to display  ED Course  I have reviewed the triage vital signs and the nursing notes.  Pertinent labs & imaging results that were available during my care of the patient were reviewed by me and considered in my medical decision making (see chart for details).    MDM Rules/Calculators/A&P                      30 year old male presenting for evaluation due to concern for pruritus to the head that started today. He is concerned for lice. I do not appreciate any evidence of lice or other rashes on exam however we will treat him for suspected lice.  Have advised follow-up with PCP and return if worse.  He voiced understanding of plan reasons return precautions interviewed patient able for discharge.   Final Clinical Impression(s) / ED Diagnoses Final diagnoses:  Pruritus    Rx / DC Orders ED Discharge Orders         Ordered    permethrin (ELIMITE) 5 % cream     06/04/19 1641           Cicero Noy S, PA-C 06/04/19 1641    Melene Plan, DO 06/04/19 2201

## 2019-06-15 ENCOUNTER — Telehealth: Payer: Self-pay

## 2019-06-15 NOTE — Telephone Encounter (Signed)
Error/tkk  

## 2019-07-03 ENCOUNTER — Ambulatory Visit: Payer: Medicaid Other | Admitting: Pharmacist

## 2019-07-12 ENCOUNTER — Ambulatory Visit: Payer: Medicaid Other | Admitting: Pharmacist

## 2019-07-20 ENCOUNTER — Other Ambulatory Visit: Payer: Self-pay | Admitting: Pharmacist

## 2019-07-20 ENCOUNTER — Other Ambulatory Visit (HOSPITAL_COMMUNITY): Payer: Self-pay | Admitting: Pharmacist

## 2019-07-20 DIAGNOSIS — B20 Human immunodeficiency virus [HIV] disease: Secondary | ICD-10-CM

## 2019-07-20 MED ORDER — SYMTUZA 800-150-200-10 MG PO TABS: 1.0000 | ORAL_TABLET | Freq: Every day | ORAL | 5 refills | Status: DC

## 2019-08-09 ENCOUNTER — Ambulatory Visit: Payer: Medicaid Other | Admitting: Pharmacist

## 2019-08-10 MED FILL — SYMTUZA 800-150-200-10 MG T: 800-150-200 | 30 days supply | Qty: 30 | Fill #0

## 2019-09-08 MED FILL — SYMTUZA 800-150-200-10 MG T: 800-150-200 | 30 days supply | Qty: 30 | Fill #1

## 2019-10-03 MED FILL — SYMTUZA 800-150-200-10 MG T: 800-150-200 | 30 days supply | Qty: 30 | Fill #1

## 2019-11-03 ENCOUNTER — Telehealth: Payer: Self-pay

## 2019-11-03 NOTE — Telephone Encounter (Signed)
[facility] Patient Advocate Encounter  [facility] and I have been unsuccsessful in reaching patient to be able to refill medication.    We have tried multiple times without a response. I also sent an Email.  Clearance Coots, CPhT Specialty Pharmacy Patient [facility] for Infectious Disease Phone: [phone] Fax:  [phone]

## 2019-11-20 MED FILL — SYMTUZA 800-150-200-10 MG T: 800-150-200 | 30 days supply | Qty: 30 | Fill #2

## 2019-12-07 ENCOUNTER — Other Ambulatory Visit (HOSPITAL_COMMUNITY)
Admission: RE | Admit: 2019-12-07 | Discharge: 2019-12-07 | Disposition: A | Discharge status: Home / Self Care | Payer: Medicaid Other | Source: Ambulatory Visit | Attending: Family | Admitting: Family

## 2019-12-07 ENCOUNTER — Other Ambulatory Visit (HOSPITAL_COMMUNITY): Payer: Self-pay | Admitting: Pharmacist

## 2019-12-07 ENCOUNTER — Other Ambulatory Visit: Payer: Self-pay

## 2019-12-07 ENCOUNTER — Ambulatory Visit (INDEPENDENT_AMBULATORY_CARE_PROVIDER_SITE_OTHER): Payer: Medicaid Other | Admitting: Pharmacist

## 2019-12-07 DIAGNOSIS — Z113 Encounter for screening for infections with a predominantly sexual mode of transmission: Secondary | ICD-10-CM | POA: Insufficient documentation

## 2019-12-07 DIAGNOSIS — B20 Human immunodeficiency virus [HIV] disease: Secondary | ICD-10-CM | POA: Diagnosis not present

## 2019-12-07 DIAGNOSIS — Z79899 Other long term (current) drug therapy: Secondary | ICD-10-CM | POA: Diagnosis not present

## 2019-12-07 MED ORDER — SYMTUZA 800-150-200-10 MG PO TABS: 1.0000 | ORAL_TABLET | Freq: Every day | ORAL | 5 refills | Status: DC

## 2019-12-07 NOTE — Progress Notes (Signed)
HPI: Steven [name] is a 29 y.o. male who presents to the [facility] pharmacy clinic for HIV follow-up.  Patient Active Problem List   Diagnosis Date Noted  . High risk homosexual behavior 07/18/2018  . Constipation 12/18/2016  . Sore throat 12/18/2016  . Exposure to gonorrhea 08/25/2016  . Healthcare maintenance 08/25/2016  . History of syphilis 07/09/2016  . Outbursts of anger 07/09/2016  . HIV (human immunodeficiency virus infection) ([facility]) 06/29/2016    Patient's Medications  New Prescriptions   No medications on file  Previous Medications   AMOXICILLIN-CLAVULANATE (AUGMENTIN) 875-125 MG TABLET    Take 1 tablet by mouth every 12 (twelve) hours.   DARUNAVIR-COBICISCTAT-EMTRICITABINE-TENOFOVIR ALAFENAMIDE (SYMTUZA) 800-150-200-10 MG TABS    Take 1 tablet by mouth daily with breakfast.   DOXYCYCLINE (VIBRA-TABS) 100 MG TABLET    Take 1 tablet (100 mg total) by mouth 2 (two) times daily.   ONDANSETRON (ZOFRAN ODT) 4 MG DISINTEGRATING TABLET    Take 1 tablet (4 mg total) by mouth every 8 (eight) hours as needed for nausea or vomiting.   PERMETHRIN (ELIMITE) 5 % CREAM    Apply to affected area once and leave on for 10 minutes. Repeat this in 9 days.   POLYVINYL ALCOHOL (LIQUIFILM TEARS) 1.4 % OPHTHALMIC SOLUTION    Place 1 drop into both eyes as needed for dry eyes.  Modified Medications   No medications on file  Discontinued Medications   No medications on file    Allergies: No Known Allergies  Past Medical History: Past Medical History:  Diagnosis Date  . Depression 07/09/2016  . History of syphilis 07/09/2016  . HIV (human immunodeficiency virus infection) ([facility]) 06/29/2016   Dx 06/10/2016  . Kidney stone    kidney stones    Social History: Social History   Socioeconomic History  . Marital status: Single    Spouse name: Not on file  . Number of children: Not on file  . Years of education: Not on file  . Highest education level: Not on file  Occupational History  . Not on  file  Tobacco Use  . Smoking status: Current Some Day Smoker    Packs/day: 0.50    Types: Cigarettes  . Smokeless tobacco: Never Used  Vaping Use  . Vaping Use: Never used  Substance and Sexual Activity  . Alcohol use: Yes    Comment: occasional  . Drug use: Yes    Types: Marijuana  . Sexual activity: Yes    Partners: Male    Birth control/protection: Condom  Other Topics Concern  . Not on file  Social History Narrative  . Not on file   Social Determinants of Health   Financial Resource Strain:   . Difficulty of Paying Living Expenses: Not on file  Food Insecurity:   . Worried About Programme researcher, broadcasting/film/video in the Last Year: Not on file  . Ran Out of Food in the Last Year: Not on file  Transportation Needs:   . Lack of Transportation (Medical): Not on file  . Lack of Transportation (Non-Medical): Not on file  Physical Activity:   . Days of Exercise per Week: Not on file  . Minutes of Exercise per Session: Not on file  Stress:   . Feeling of Stress : Not on file  Social Connections:   . Frequency of Communication with Friends and Family: Not on file  . Frequency of Social Gatherings with Friends and Family: Not on file  . Attends Religious Services: Not  on file  . Active Member of Clubs or Organizations: Not on file  . Attends Banker Meetings: Not on file  . Marital Status: Not on file    Labs: Lab Results  Component Value Date   HIV1RNAQUANT <20 DETECTED (A) 05/23/2019   HIV1RNAQUANT <20 DETECTED (A) 03/24/2019   HIV1RNAQUANT 40 (H) 10/17/2018   CD4TABS 653 03/24/2019   CD4TABS 741 07/18/2018   CD4TABS 540 03/22/2018    RPR and STI Lab Results  Component Value Date   LABRPR REACTIVE (A) 05/23/2019   LABRPR REACTIVE (A) 10/17/2018   LABRPR REACTIVE (A) 07/18/2018   LABRPR REACTIVE (A) 03/22/2018   LABRPR REACTIVE (A) 12/17/2017   RPRTITER 1:8 (H) 05/23/2019   RPRTITER 1:2 (H) 10/17/2018   RPRTITER 1:1 (H) 07/18/2018   RPRTITER 1:2 (H)  03/22/2018   RPRTITER 1:8 (H) 12/17/2017    STI Results GC CT  05/23/2019 Negative Negative  05/23/2019 Positive(A) Negative  10/17/2018 Positive(A) Negative  07/18/2018 Negative Negative  07/18/2018 Negative Negative  03/22/2018 Negative Negative  03/22/2018 **POSITIVE**(A) **POSITIVE**(A)  03/22/2018 **POSITIVE**(A) Negative  12/17/2017 Negative Negative  12/17/2017 Negative Negative  12/17/2017 Negative **POSITIVE**(A)  01/28/2017 Negative Negative  12/18/2016 Negative Negative  12/18/2016 Negative Negative  12/18/2016 Negative Negative  08/25/2016 Negative Negative  08/25/2016 **POSITIVE**(A) Negative  07/09/2016 Negative Negative  07/09/2016 Negative Negative  06/10/2016 Negative Negative    Hepatitis B Lab Results  Component Value Date   HEPBSAB NEG 07/09/2016   HEPBSAG NON-REACTIVE 12/17/2017   Hepatitis C Lab Results  Component Value Date   HEPCAB NON-REACTIVE 12/17/2017   Hepatitis A Lab Results  Component Value Date   HAV NON-REACTIVE 12/17/2017   Lipids: Lab Results  Component Value Date   CHOL 135 12/17/2017   TRIG 33 12/17/2017   HDL 56 12/17/2017   CHOLHDL 2.4 12/17/2017   VLDL 6 07/09/2016   LDLCALC 70 12/17/2017    Current HIV Regimen: Symtuza + Tivicay  Assessment: Terryn is here today to follow up for his HIV infection. I last saw him in May where he was doing well and had an undetectable HIV viral load. He has missed several appointments with me since then but comes in today and is requesting STI testing.   I reminded him that he should come to his appointments even when he doesn't need STI testing because we don't normally just see patients for that. He is still taking his Symtuza daily with no issues. I will check labs today and send in refills of his medication.   He is requesting an appointment for his 2nd COVID vaccine. He received his first one in June. I made him a follow up in a few weeks in one of our Friday COVID clinics. I will schedule  him with Tammy Sours in 4 weeks. Encouraged him to come to this appointment as he has not seen a provider since July 2020 Tammy Sours). He put both appointments in his phone and states that he will be here. He still has [employer] and has no issues obtaining his Symtuza.    Plan: - HIV RNA, CD4, RPR, urine/rectal cytology, CMET, CBC - Continue Symtuza, refills sent - F/u with Tammy Sours 1/4 at 4pm  Manika Hast L. Audreanna Torrisi, PharmD, BCIDP, AAHIVP, CPP Clinical Pharmacist Practitioner Infectious Diseases Clinical Pharmacist [facility] 12/07/2019, 3:36 PM

## 2019-12-08 LAB — CYTOLOGY, (ORAL, ANAL, URETHRAL) ANCILLARY ONLY
Chlamydia: NEGATIVE
Comment: NEGATIVE
Comment: NORMAL
Neisseria Gonorrhea: NEGATIVE

## 2019-12-08 LAB — T-HELPER CELL (CD4) - (RCID CLINIC ONLY)
CD4 % Helper T Cell: 31 % — ABNORMAL LOW (ref 33–65)
CD4 T Cell Abs: 558 /uL (ref 400–1790)

## 2019-12-09 LAB — RPR TITER: RPR Titer: 1:2 {titer} — ABNORMAL HIGH

## 2019-12-09 LAB — CBC WITH DIFFERENTIAL/PLATELET
Absolute Monocytes: 403 cells/uL (ref 200–950)
Basophils Absolute: 29 cells/uL (ref 0–200)
Basophils Relative: 0.6 %
Eosinophils Absolute: 173 cells/uL (ref 15–500)
Eosinophils Relative: 3.6 %
HCT: 41.2 % (ref 38.5–50.0)
Hemoglobin: 14.3 g/dL (ref 13.2–17.1)
Lymphs Abs: 1920 cells/uL (ref 850–3900)
MCH: 30.2 pg (ref 27.0–33.0)
MCHC: 34.7 g/dL (ref 32.0–36.0)
MCV: 86.9 fL (ref 80.0–100.0)
MPV: 10.6 fL (ref 7.5–12.5)
Monocytes Relative: 8.4 %
Neutro Abs: 2275 cells/uL (ref 1500–7800)
Neutrophils Relative %: 47.4 %
Platelets: 141 10*3/uL (ref 140–400)
RBC: 4.74 10*6/uL (ref 4.20–5.80)
RDW: 13 % (ref 11.0–15.0)
Total Lymphocyte: 40 %
WBC: 4.8 10*3/uL (ref 3.8–10.8)

## 2019-12-09 LAB — COMPREHENSIVE METABOLIC PANEL
AG Ratio: 2.1 (calc) (ref 1.0–2.5)
ALT: 22 U/L (ref 9–46)
AST: 20 U/L (ref 10–40)
Albumin: 4.4 g/dL (ref 3.6–5.1)
Alkaline phosphatase (APISO): 44 U/L (ref 36–130)
BUN: 9 mg/dL (ref 7–25)
CO2: 23 mmol/L (ref 20–32)
Calcium: 9.1 mg/dL (ref 8.6–10.3)
Chloride: 105 mmol/L (ref 98–110)
Creat: 0.89 mg/dL (ref 0.60–1.35)
Globulin: 2.1 g/dL (calc) (ref 1.9–3.7)
Glucose, Bld: 80 mg/dL (ref 65–99)
Potassium: 4.2 mmol/L (ref 3.5–5.3)
Sodium: 140 mmol/L (ref 135–146)
Total Bilirubin: 0.3 mg/dL (ref 0.2–1.2)
Total Protein: 6.5 g/dL (ref 6.1–8.1)

## 2019-12-09 LAB — HIV-1 RNA QUANT-NO REFLEX-BLD
HIV 1 RNA Quant: <20 Copies/mL — ABNORMAL HIGH
HIV-1 RNA Quant, Log: <1.3 Log cps/mL — ABNORMAL HIGH

## 2019-12-09 LAB — RPR: RPR Ser Ql: REACTIVE — AB

## 2019-12-09 LAB — FLUORESCENT TREPONEMAL AB(FTA)-IGG-BLD: Fluorescent Treponemal ABS: REACTIVE — AB

## 2019-12-19 ENCOUNTER — Telehealth: Payer: Self-pay

## 2019-12-19 NOTE — Telephone Encounter (Signed)
[facility] Patient Advocate Encounter  [facility] and I have been unsuccsessful in reaching patient to be able to refill medication.    We have tried multiple times without a response.  Karryn Kosinski, CPhT Specialty Pharmacy Patient Advocate [facility] Phone: [phone] Fax:  [phone]  

## 2019-12-21 MED FILL — SYMTUZA 800-150-200-10 MG T: 800-150-200 | 30 days supply | Qty: 30 | Fill #0

## 2019-12-22 ENCOUNTER — Ambulatory Visit: Payer: Medicaid Other

## 2020-01-09 ENCOUNTER — Ambulatory Visit: Payer: Medicaid Other | Admitting: Family

## 2020-02-29 MED FILL — SYMTUZA 800-150-200-10 MG T: 800-150-200 | 30 days supply | Qty: 30 | Fill #1

## 2020-03-29 ENCOUNTER — Ambulatory Visit: Payer: Medicaid Other | Admitting: Family

## 2020-04-01 MED FILL — SYMTUZA 800-150-200-10 MG T: 800-150-200 | 30 days supply | Qty: 30 | Fill #2

## 2020-04-04 ENCOUNTER — Encounter: Payer: Self-pay | Admitting: Family

## 2020-04-04 ENCOUNTER — Other Ambulatory Visit (HOSPITAL_COMMUNITY)
Admission: RE | Admit: 2020-04-04 | Discharge: 2020-04-04 | Disposition: A | Discharge status: Home / Self Care | Payer: Medicaid Other | Source: Ambulatory Visit | Attending: Family | Admitting: Family

## 2020-04-04 ENCOUNTER — Other Ambulatory Visit (HOSPITAL_COMMUNITY): Payer: Self-pay | Admitting: Family

## 2020-04-04 ENCOUNTER — Ambulatory Visit (INDEPENDENT_AMBULATORY_CARE_PROVIDER_SITE_OTHER): Payer: Medicaid Other | Admitting: Family

## 2020-04-04 ENCOUNTER — Other Ambulatory Visit: Payer: Self-pay

## 2020-04-04 ENCOUNTER — Other Ambulatory Visit (HOSPITAL_COMMUNITY): Payer: Self-pay

## 2020-04-04 VITALS — BP 107/70 | HR 76 | Temp 98.1°F | Wt 244.0 lb

## 2020-04-04 DIAGNOSIS — Z23 Encounter for immunization: Secondary | ICD-10-CM

## 2020-04-04 DIAGNOSIS — Z113 Encounter for screening for infections with a predominantly sexual mode of transmission: Secondary | ICD-10-CM

## 2020-04-04 DIAGNOSIS — Z79899 Other long term (current) drug therapy: Secondary | ICD-10-CM | POA: Diagnosis not present

## 2020-04-04 DIAGNOSIS — Z0000 Encounter for general adult medical examination without abnormal findings: Secondary | ICD-10-CM

## 2020-04-04 DIAGNOSIS — Z21 Asymptomatic human immunodeficiency virus [HIV] infection status: Secondary | ICD-10-CM | POA: Diagnosis present

## 2020-04-04 MED ORDER — DOVATO 50-300 MG PO TABS: 1.0000 | ORAL_TABLET | Freq: Every day | ORAL | 3 refills | Status: DC

## 2020-04-04 NOTE — Patient Instructions (Signed)
Nice to see you.  Change medication to Dovato from Symtuza  Samples have been provided.  Medication be sent to the pharmacy for continuation of care.  We will check your blood work today.  Plan for follow-up in 1 month or sooner if needed.

## 2020-04-04 NOTE — Progress Notes (Signed)
   Covid-19 Vaccination Clinic  Name:  Ammaar Encina    MRN: [id number] DOB: August 29, 1990  04/04/2020  Mr. Diaz was observed post Covid-19 immunization for 15 minutes without incident. He was provided with Vaccine Information Sheet and instruction to access the V-Safe system.   Mr. Quinter was instructed to call 911 with any severe reactions post vaccine: Marland Kitchen Difficulty breathing  . Swelling of face and throat  . A fast heartbeat  . A bad rash all over body  . Dizziness and weakness     Mykira Hofmeister T Pricilla Loveless

## 2020-04-04 NOTE — Assessment & Plan Note (Signed)
   Discussed importance of safe sexual practice to reduce risk of STI.  Condoms provided.  Routine dental care is up-to-date per recommendations.  Second dose of Covid vaccine and Menveo updated today.

## 2020-04-04 NOTE — Progress Notes (Signed)
Brief Narrative   Patient ID: Steven [name], male    DOB: November 09, 1990, 30 y.o.   MRN: [id number]  Steven [name] is a 30 y/o AA male with HIV disease diagnosed in June 2018. Genotype without significant resistance. Initial CD4 count was 280 and initial viral load of 55,300. No history of opportunistic infection. No previous treatment regimens prior to [facility].   Subjective:    Chief Complaint  Patient presents with  . Follow-up    B20     HPI:  Steven [name] is a 30 y.o. male with HIV disease last seen on 12/07/19 by Aggie Cosier, RPH-CPP with well controlled virus and good adherence and tolerance to his ART regimen of Symtuza. Viral load at the time was undetectable and CD4 count 558. No recent blood work completed. Here today for routine follow up.  [name] has been taking his Symtuza as prescribed with occasional missed doses.  Describes itching since starting Symtuza that is unrelieved with creams/lotions.  No rashes. Denies fevers, chills, night sweats, headaches, changes in vision, neck pain/stiffness, nausea, diarrhea, vomiting, lesions or rashes.  Mr. Stay has no problems obtaining medication from the pharmacy and remains covered through [facility].  Denies feelings of being down, depressed, or hopeless recently.  Condoms provided.  Routine dental care is up-to-date per recommendations.  Received his COVID vaccination today and healthcare maintenance due includes second dose of Menveo.  No Known Allergies    Outpatient Medications Prior to Visit  Medication Sig Dispense Refill  . Darunavir-Cobicisctat-Emtricitabine-Tenofovir Alafenamide (SYMTUZA) 800-150-200-10 MG TABS Take 1 tablet by mouth daily with breakfast. 30 tablet 5  . amoxicillin-clavulanate (AUGMENTIN) 875-125 MG tablet Take 1 tablet by mouth every 12 (twelve) hours. (Patient not taking: Reported on 04/04/2020) 10 tablet 0  . doxycycline (VIBRA-TABS) 100 MG tablet Take 1 tablet (100 mg total) by mouth 2  (two) times daily. (Patient not taking: Reported on 04/04/2020) 14 tablet 0  . ondansetron (ZOFRAN ODT) 4 MG disintegrating tablet Take 1 tablet (4 mg total) by mouth every 8 (eight) hours as needed for nausea or vomiting. (Patient not taking: Reported on 04/04/2020) 8 tablet 0  . permethrin (ELIMITE) 5 % cream Apply to affected area once and leave on for 10 minutes. Repeat this in 9 days. (Patient not taking: Reported on 04/04/2020) 60 g 0  . polyvinyl alcohol (LIQUIFILM TEARS) 1.4 % ophthalmic solution Place 1 drop into both eyes as needed for dry eyes. (Patient not taking: Reported on 04/04/2020)     No facility-administered medications prior to visit.     Past Medical History:  Diagnosis Date  . Depression 07/09/2016  . History of syphilis 07/09/2016  . HIV (human immunodeficiency virus infection) ([facility]) 06/29/2016   Dx 06/10/2016  . Kidney stone    kidney stones     No past surgical history on file.     Review of Systems  Constitutional: Negative for appetite change, chills, fatigue, fever and unexpected weight change.  Eyes: Negative for visual disturbance.  Respiratory: Negative for cough, chest tightness, shortness of breath and wheezing.   Cardiovascular: Negative for chest pain and leg swelling.  Gastrointestinal: Negative for abdominal pain, constipation, diarrhea, nausea and vomiting.  Genitourinary: Negative for dysuria, flank pain, frequency, genital sores, hematuria and urgency.  Skin: Negative for rash.       Positive for itching  Allergic/Immunologic: Negative for immunocompromised state.  Neurological: Negative for dizziness and headaches.      Objective:    BP 107/70  Pulse 76   Temp 98.1 F (36.7 C) (Oral)   Wt 244 lb (110.7 kg)   BMI 34.03 kg/m  Nursing note and vital signs reviewed.  Physical Exam Constitutional:      General: He is not in acute distress.    Appearance: He is well-developed.  Eyes:     Conjunctiva/sclera: Conjunctivae normal.   Cardiovascular:     Rate and Rhythm: Normal rate and regular rhythm.     Heart sounds: Normal heart sounds. No murmur heard. No friction rub. No gallop.   Pulmonary:     Effort: Pulmonary effort is normal. No respiratory distress.     Breath sounds: Normal breath sounds. No wheezing or rales.  Chest:     Chest wall: No tenderness.  Abdominal:     General: Bowel sounds are normal.     Palpations: Abdomen is soft.     Tenderness: There is no abdominal tenderness.  Musculoskeletal:     Cervical back: Neck supple.  Lymphadenopathy:     Cervical: No cervical adenopathy.  Skin:    General: Skin is warm and dry.     Findings: No rash.  Neurological:     Mental Status: He is alert and oriented to person, place, and time.  Psychiatric:        Behavior: Behavior normal.        Thought Content: Thought content normal.        Judgment: Judgment normal.      Depression screen [facility] 2/9 04/04/2020 12/18/2016 08/25/2016 08/24/2016 08/18/2016  Decreased Interest 0 0 0 0 0  Down, Depressed, Hopeless 0 0 0 0 0  PHQ - 2 Score 0 0 0 0 0       Assessment & Plan:    Patient Active Problem List   Diagnosis Date Noted  . High risk homosexual behavior 07/18/2018  . Constipation 12/18/2016  . Sore throat 12/18/2016  . Exposure to gonorrhea 08/25/2016  . Healthcare maintenance 08/25/2016  . History of syphilis 07/09/2016  . Outbursts of anger 07/09/2016  . HIV (human immunodeficiency virus infection) (HCC) 06/29/2016     Problem List Items Addressed This Visit   None   Visit Diagnoses    Encounter for immunization    -  Primary   Relevant Orders   PFIZER Comirnaty(GRAY TOP)COVID-19 Vaccine (Completed)       I am having Steven [name] maintain his polyvinyl alcohol, ondansetron, amoxicillin-clavulanate, doxycycline, permethrin, and Symtuza.   No orders of the defined types were placed in this encounter.    Follow-up: No follow-ups on file.   [name], MSN, FNP-C Nurse  Practitioner [facility] for Infectious Disease [facility] Main number: [phone]

## 2020-04-04 NOTE — Assessment & Plan Note (Signed)
Steven [name] appears to have well-controlled HIV disease with good adherence to [facility] and has been experiencing increased itching since starting the medication.  No signs/symptoms of opportunistic infection or progressive HIV.  Reviewed previous lab work and discussed plan of care.  Discontinue Symtuza.  Start Dovato.  Check blood work today.  Plan for follow-up in 1 month or sooner if needed with lab work on the same day.

## 2020-04-05 LAB — T-HELPER CELL (CD4) - (RCID CLINIC ONLY)
CD4 % Helper T Cell: 31 % — ABNORMAL LOW (ref 33–65)
CD4 T Cell Abs: 674 /uL (ref 400–1790)

## 2020-04-05 NOTE — Addendum Note (Signed)
Addended by: Tressa Busman T on: 04/05/2020 08:42 AM   Modules accepted: Orders

## 2020-04-07 LAB — URINE CYTOLOGY ANCILLARY ONLY
Chlamydia: NEGATIVE
Comment: NEGATIVE
Comment: NORMAL
Neisseria Gonorrhea: NEGATIVE

## 2020-04-07 LAB — CYTOLOGY, (ORAL, ANAL, URETHRAL) ANCILLARY ONLY
Chlamydia: NEGATIVE
Chlamydia: NEGATIVE
Comment: NEGATIVE
Comment: NEGATIVE
Comment: NORMAL
Comment: NORMAL
Neisseria Gonorrhea: NEGATIVE
Neisseria Gonorrhea: NEGATIVE

## 2020-04-08 ENCOUNTER — Other Ambulatory Visit (HOSPITAL_COMMUNITY): Payer: Self-pay

## 2020-04-08 LAB — RPR: RPR Ser Ql: REACTIVE — AB

## 2020-04-08 LAB — LIPID PANEL
Cholesterol: 167 mg/dL (ref <200)
HDL: 72 mg/dL (ref >=40)
LDL Cholesterol (Calc): 84 mg/dL (calc)
Non-HDL Cholesterol (Calc): 95 mg/dL (calc) (ref <130)
Total CHOL/HDL Ratio: 2.3 (calc) (ref <5.0)
Triglycerides: 40 mg/dL (ref <150)

## 2020-04-08 LAB — RPR TITER: RPR Titer: 1:2 {titer} — ABNORMAL HIGH

## 2020-04-08 LAB — HIV-1 RNA QUANT-NO REFLEX-BLD
HIV 1 RNA Quant: <20 Copies/mL — ABNORMAL HIGH
HIV-1 RNA Quant, Log: <1.3 Log cps/mL — ABNORMAL HIGH

## 2020-04-08 LAB — FLUORESCENT TREPONEMAL AB(FTA)-IGG-BLD: Fluorescent Treponemal ABS: REACTIVE — AB

## 2020-04-08 MED FILL — Dolutegravir Sodium-Lamivudine Tab 50-300 MG (Base Eq): ORAL | 30 days supply | Qty: 30 | Fill #0 | Status: AC

## 2020-05-01 ENCOUNTER — Ambulatory Visit: Payer: Medicaid Other | Admitting: Family

## 2020-05-01 ENCOUNTER — Other Ambulatory Visit (HOSPITAL_COMMUNITY): Payer: Self-pay

## 2020-05-03 ENCOUNTER — Other Ambulatory Visit (HOSPITAL_COMMUNITY): Payer: Self-pay

## 2020-05-06 ENCOUNTER — Other Ambulatory Visit (HOSPITAL_COMMUNITY): Payer: Self-pay

## 2020-05-27 ENCOUNTER — Other Ambulatory Visit: Payer: Self-pay

## 2020-05-27 ENCOUNTER — Ambulatory Visit (INDEPENDENT_AMBULATORY_CARE_PROVIDER_SITE_OTHER): Payer: Medicaid Other | Admitting: Family

## 2020-05-27 ENCOUNTER — Encounter: Payer: Self-pay | Admitting: Family

## 2020-05-27 VITALS — BP 132/82 | HR 62 | Temp 98.6°F | Wt 235.0 lb

## 2020-05-27 DIAGNOSIS — Z21 Asymptomatic human immunodeficiency virus [HIV] infection status: Secondary | ICD-10-CM

## 2020-05-27 DIAGNOSIS — Z0000 Encounter for general adult medical examination without abnormal findings: Secondary | ICD-10-CM | POA: Diagnosis not present

## 2020-05-27 DIAGNOSIS — Z23 Encounter for immunization: Secondary | ICD-10-CM | POA: Diagnosis not present

## 2020-05-27 NOTE — Progress Notes (Signed)
Brief Narrative   Patient ID: Steven [name], male    DOB: [birth year], 30 y.o.   MRN: [id number]  Steven [name] is a 30 y/o AA male with HIV disease diagnosed in June 2018. Genotype without significant resistance. Initial CD4 count was 280 and initial viral load of 55,300. No history of opportunistic infection. No previous treatment regimens prior to [place], [place].   Subjective:    Chief Complaint  Patient presents with  . Follow-up    B20     HPI:  Steven [name] is a 30 y.o. male with HIV disease last seen on 04/04/20 with well controlled virus and good adherence to [facility] which due to itching was transitioned to Dovato. Lab work with undetectable viral load and CD4 count of 674. Here today for routine follow up.   Steven [name] has been taking Dovato daily as prescribed with no adverse side effects or missed doses since his last office visit.  Itching has resolved.  Overall feeling well today with no new concerns/complaints. Denies fevers, chills, night sweats, headaches, changes in vision, neck pain/stiffness, nausea, diarrhea, vomiting, lesions or rashes.  Steven [name] has no problems obtaining medication from the pharmacy remains covered through [facility].  Denies feeling down, depressed, or hopeless recently.  No current recreational or illicit drug use or tobacco use.  Alcohol consumption is rare.  Routine dental care is up-to-date.  Healthcare maintenance due includes [facility]is[facility]Marland Kitchen   No Known Allergies    Outpatient Medications Prior to Visit  Medication Sig Dispense Refill  . Dolutegravir-lamiVUDine (DOVATO) 50-300 MG TABS Take 1 tablet by mouth daily. 30 tablet 3  . Darunavir-Cobicisctat-Emtricitabine-Tenofovir Alafenamide (SYMTUZA) 800-150-200-10 MG TABS TAKE 1 TABLET BY MOUTH DAILY WITH BREAKFAST. (Patient not taking: Reported on 05/27/2020) 30 tablet 5  . Darunavir-Cobicisctat-Emtricitabine-Tenofovir Alafenamide (SYMTUZA) 800-150-200-10 MG TABS TAKE 1 TABLET BY MOUTH DAILY  WITH BREAKFAST. (Patient not taking: Reported on 05/27/2020) 30 tablet 5  . Dolutegravir-lamiVUDine 50-300 MG TABS TAKE 1 TABLET BY MOUTH DAILY. (Patient not taking: Reported on 05/27/2020) 30 tablet 3   No facility-administered medications prior to visit.     Past Medical History:  Diagnosis Date  . Depression 07/09/2016  . History of syphilis 07/09/2016  . HIV (human immunodeficiency virus infection) (HCC) 06/29/2016   Dx 06/10/2016  . Kidney stone    kidney stones    No past surgical history on file.   Review of Systems  Constitutional: Negative for appetite change, chills, fatigue, fever and unexpected weight change.  Eyes: Negative for visual disturbance.  Respiratory: Negative for cough, chest tightness, shortness of breath and wheezing.   Cardiovascular: Negative for chest pain and leg swelling.  Gastrointestinal: Negative for abdominal pain, constipation, diarrhea, nausea and vomiting.  Genitourinary: Negative for dysuria, flank pain, frequency, genital sores, hematuria and urgency.  Skin: Negative for rash.  Allergic/Immunologic: Negative for immunocompromised state.  Neurological: Negative for dizziness and headaches.      Objective:    BP 132/82   Pulse 62   Temp 98.6 F (37 C) (Oral)   Wt 235 lb (106.6 kg)   BMI 32.78 kg/m  Nursing note and vital signs reviewed.  Physical Exam Constitutional:      General: He is not in acute distress.    Appearance: He is well-developed.  Eyes:     Conjunctiva/sclera: Conjunctivae normal.  Cardiovascular:     Rate and Rhythm: Normal rate and regular rhythm.     Heart sounds: Normal heart sounds. No murmur heard. No friction rub.  No gallop.   Pulmonary:     Effort: Pulmonary effort is normal. No respiratory distress.     Breath sounds: Normal breath sounds. No wheezing or rales.  Chest:     Chest wall: No tenderness.  Abdominal:     General: Bowel sounds are normal.     Palpations: Abdomen is soft.     Tenderness: There  is no abdominal tenderness.  Musculoskeletal:     Cervical back: Neck supple.  Lymphadenopathy:     Cervical: No cervical adenopathy.  Skin:    General: Skin is warm and dry.     Findings: No rash.  Neurological:     Mental Status: He is alert and oriented to person, place, and time.  Psychiatric:        Behavior: Behavior normal.        Thought Content: Thought content normal.        Judgment: Judgment normal.      Depression screen [facility] 2/9 05/27/2020 04/04/2020 12/18/2016 08/25/2016 08/24/2016  Decreased Interest 0 0 0 0 0  Down, Depressed, Hopeless 0 0 0 0 0  PHQ - 2 Score 0 0 0 0 0       Assessment & Plan:    Patient Active Problem List   Diagnosis Date Noted  . High risk homosexual behavior 07/18/2018  . Constipation 12/18/2016  . Sore throat 12/18/2016  . Exposure to gonorrhea 08/25/2016  . Healthcare maintenance 08/25/2016  . History of syphilis 07/09/2016  . Outbursts of anger 07/09/2016  . HIV (human immunodeficiency virus infection) (HCC) 06/29/2016     Problem List Items Addressed This Visit      Other   HIV (human immunodeficiency virus infection) (HCC) - Primary (Chronic)    Steven [name] continues to have well-controlled HIV disease with good adherence and tolerance to his ART regimen of Dovato.  Previous signs of itching have resolved.  No signs/symptoms of opportunistic infection.  Reviewed previous lab work and discussed plan of care.  Continue current dose of Dovato.  Plan for follow-up in 3 months or sooner if needed with lab work on the same day.      Relevant Orders   HIV-1 RNA quant-no reflex-bld   Healthcare maintenance     Discussed importance of safe sexual practice to reduce risk of STI.  Condoms declined.  Routine dental care is up-to-date per recommendations.  Hepatitis B vaccination provided today          I have discontinued Steven [name]'s Dolutegravir-lamiVUDine, Darunavir-Cobicisctat-Emtricitabine-Tenofovir Alafenamide, and  Darunavir-Cobicisctat-Emtricitabine-Tenofovir Alafenamide. I am also having him maintain his Dovato.   Follow-up: Return in about 3 months (around 08/27/2020), or if symptoms worsen or fail to improve.   [name], MSN, FNP-C Nurse Practitioner [facility] for Infectious Disease [facility] Main number: [phone]

## 2020-05-27 NOTE — Patient Instructions (Signed)
Nice to see you.  We will check your lab work today.  Continue to take your medication daily.  Plan for follow up in 3 months or sooner if needed with lab work on the same day.  Have a great day and stay safe!

## 2020-05-27 NOTE — Assessment & Plan Note (Signed)
Mr. Higbie continues to have well-controlled HIV disease with good adherence and tolerance to his ART regimen of Dovato.  Previous signs of itching have resolved.  No signs/symptoms of opportunistic infection.  Reviewed previous lab work and discussed plan of care.  Continue current dose of Dovato.  Plan for follow-up in 3 months or sooner if needed with lab work on the same day.

## 2020-05-27 NOTE — Assessment & Plan Note (Signed)
   Discussed importance of safe sexual practice to reduce risk of STI.  Condoms declined.  Routine dental care is up-to-date per recommendations.  Hepatitis B vaccination provided today

## 2020-05-29 LAB — HIV-1 RNA QUANT-NO REFLEX-BLD
HIV 1 RNA Quant: <20 Copies/mL — ABNORMAL HIGH
HIV-1 RNA Quant, Log: <1.3 Log cps/mL — ABNORMAL HIGH

## 2020-06-11 ENCOUNTER — Other Ambulatory Visit (HOSPITAL_COMMUNITY): Payer: Self-pay

## 2020-06-11 ENCOUNTER — Other Ambulatory Visit: Payer: Self-pay

## 2020-06-11 MED ORDER — DOVATO 50-300 MG PO TABS
1.0000 | ORAL_TABLET | Freq: Every day | ORAL | 3 refills | Status: DC
  Filled 2020-06-11 (×2): qty 30, 30d supply, fill #0
  Filled 2020-07-17: qty 30, 30d supply, fill #1
  Filled 2020-08-22: qty 30, 30d supply, fill #2

## 2020-06-19 ENCOUNTER — Other Ambulatory Visit: Payer: Self-pay

## 2020-06-19 ENCOUNTER — Other Ambulatory Visit: Payer: Medicaid Other

## 2020-06-19 DIAGNOSIS — R109 Unspecified abdominal pain: Secondary | ICD-10-CM

## 2020-06-19 DIAGNOSIS — R10A0 Flank pain, unspecified side: Secondary | ICD-10-CM

## 2020-06-19 NOTE — Progress Notes (Signed)
Patient here as a walk-in complains of left-sided flank pain that started about 1 week ago. He reports mild swelling to the left flank. Patient states he has not taken his temperature but has been feeling "hot" even when inside. Denies any urinary symptoms.   RN spoke with [name], NP who has ordered a urinalysis and advises that patient use hot/cold therapy, over the counter pain medication, and stretching to relieve the pain. RN  relayed these recommendations to the patient. Patient verbalized understanding and has no further questions.   Sandie Ano, RN

## 2020-06-19 NOTE — Addendum Note (Signed)
Addended byJimmy Picket F on: 06/19/2020 04:23 PM   Modules accepted: Orders

## 2020-06-20 ENCOUNTER — Telehealth: Payer: Self-pay

## 2020-06-20 LAB — URINALYSIS, ROUTINE W REFLEX MICROSCOPIC
Bacteria, UA: NONE SEEN /HPF
Bilirubin Urine: NEGATIVE
Glucose, UA: NEGATIVE
Hgb urine dipstick: NEGATIVE
Hyaline Cast: NONE SEEN /LPF
Leukocytes,Ua: NEGATIVE
Nitrite: NEGATIVE
Specific Gravity, Urine: 1.035 (ref 1.001–1.035)
Squamous Epithelial / HPF: NONE SEEN /HPF (ref <=5)
WBC, UA: NONE SEEN /HPF (ref 0–5)
pH: 5.5 (ref 5.0–8.0)

## 2020-06-20 LAB — MICROSCOPIC MESSAGE

## 2020-06-20 NOTE — Telephone Encounter (Signed)
-----   Message from Veryl Speak, FNP sent at 06/20/2020  9:29 AM EDT ----- Please inform Mr. Schmieder his urine has no signs of infection.

## 2020-06-20 NOTE — Telephone Encounter (Signed)
Relayed to patient that his urine showed no signs of infection. Patient verbalized understanding and has no further questions.   Sandie Ano, RN

## 2020-07-03 ENCOUNTER — Other Ambulatory Visit: Payer: Self-pay | Admitting: Family

## 2020-07-03 DIAGNOSIS — Z21 Asymptomatic human immunodeficiency virus [HIV] infection status: Secondary | ICD-10-CM

## 2020-07-17 ENCOUNTER — Other Ambulatory Visit (HOSPITAL_COMMUNITY): Payer: Self-pay

## 2020-07-24 ENCOUNTER — Other Ambulatory Visit (HOSPITAL_COMMUNITY): Payer: Self-pay

## 2020-07-25 ENCOUNTER — Emergency Department (HOSPITAL_COMMUNITY)
Admission: EM | Admit: 2020-07-25 | Discharge: 2020-07-25 | Disposition: A | Discharge status: Home / Self Care | Payer: Medicaid Other | Attending: Emergency Medicine | Admitting: Emergency Medicine

## 2020-07-25 ENCOUNTER — Other Ambulatory Visit: Payer: Self-pay

## 2020-07-25 ENCOUNTER — Encounter (HOSPITAL_COMMUNITY): Payer: Self-pay

## 2020-07-25 DIAGNOSIS — Z87891 Personal history of nicotine dependence: Secondary | ICD-10-CM | POA: Insufficient documentation

## 2020-07-25 DIAGNOSIS — Z21 Asymptomatic human immunodeficiency virus [HIV] infection status: Secondary | ICD-10-CM | POA: Diagnosis not present

## 2020-07-25 DIAGNOSIS — Z79899 Other long term (current) drug therapy: Secondary | ICD-10-CM | POA: Insufficient documentation

## 2020-07-25 DIAGNOSIS — K0889 Other specified disorders of teeth and supporting structures: Secondary | ICD-10-CM | POA: Insufficient documentation

## 2020-07-25 MED ORDER — BUPIVACAINE-EPINEPHRINE (PF) 0.5% -1:200000 IJ SOLN: 1.8000 mL | Freq: Once | INTRAMUSCULAR | Status: DC

## 2020-07-25 MED ORDER — AMOXICILLIN 500 MG PO CAPS: 500.0000 mg | ORAL_CAPSULE | Freq: Three times a day (TID) | ORAL | 0 refills | Status: DC

## 2020-07-25 NOTE — ED Provider Notes (Signed)
[facility] EMERGENCY DEPARTMENT Provider Note   CSN: [id number] Arrival date & time: 07/25/20  2041     History Chief Complaint  Patient presents with   Dental Pain    Steven [name] is a 30 y.o. male.  The history is provided by the patient. No language interpreter was used.  Dental Pain Location:  Upper and lower Upper teeth location:  12/LU 1st bicuspid Lower teeth location:  20/LL 2nd bicuspid Quality:  Aching and constant Severity:  Moderate Onset quality:  Gradual Duration:  2 months Timing:  Intermittent Progression:  Waxing and waning Chronicity:  Recurrent Context: dental caries and poor dentition   Context: not trauma   Previous work-up:  Dental exam Worsened by:  Touching Ineffective treatments:  Acetaminophen and NSAIDs Associated symptoms: facial pain   Associated symptoms: no difficulty swallowing, no drooling, no fever and no trismus   Risk factors: smoking       Past Medical History:  Diagnosis Date   Depression 07/09/2016   History of syphilis 07/09/2016   HIV (human immunodeficiency virus infection) (HCC) 06/29/2016   Dx 06/10/2016   Kidney stone    kidney stones    Patient Active Problem List   Diagnosis Date Noted   High risk homosexual behavior 07/18/2018   Constipation 12/18/2016   Sore throat 12/18/2016   Exposure to gonorrhea 08/25/2016   Healthcare maintenance 08/25/2016   History of syphilis 07/09/2016   Outbursts of anger 07/09/2016   HIV (human immunodeficiency virus infection) (HCC) 06/29/2016    History reviewed. No pertinent surgical history.     Family History  Problem Relation Age of Onset   Mental illness Mother     Social History   Tobacco Use   Smoking status: Former    Packs/day: 0.50    Types: Cigarettes   Smokeless tobacco: Never  Vaping Use   Vaping Use: Never used  Substance Use Topics   Alcohol use: Yes    Comment: rarely   Drug use: Not Currently    Types: Marijuana    Home  Medications Prior to Admission medications   Medication Sig Start Date End Date Taking? Authorizing Provider  amoxicillin (AMOXIL) 500 MG capsule Take 1 capsule (500 mg total) by mouth 3 (three) times daily. 07/25/20  Yes Antony Madura, PA-C  Dolutegravir-lamiVUDine (DOVATO) 50-300 MG TABS Take 1 tablet by mouth daily. 06/11/20   Veryl Speak, FNP    Allergies    Patient has no known allergies.  Review of Systems   Review of Systems  Constitutional:  Negative for fever.  HENT:  Negative for drooling.   Ten systems reviewed and are negative for acute change, except as noted in the HPI.     Physical Exam Updated Vital Signs BP 119/79 (BP Location: Left Arm)   Pulse 82   Temp 98.7 F (37.1 C) (Oral)   Resp 20   Ht 5\' 11"  (1.803 m)   Wt 79.4 kg   SpO2 97%   BMI 24.41 kg/m   Physical Exam Vitals and nursing note reviewed.  Constitutional:      General: He is not in acute distress.    Appearance: He is well-developed. He is not diaphoretic.     Comments: Nontoxic appearing and in NAD  HENT:     Head: Normocephalic and atraumatic.     Mouth/Throat:     Mouth: Mucous membranes are moist.     Dentition: Dental caries present. No dental abscesses.  Pharynx: Oropharynx is clear.      Comments: No trismus or malocclusion Eyes:     General: No scleral icterus.    Conjunctiva/sclera: Conjunctivae normal.  Neck:     Comments: No meningismus Pulmonary:     Effort: Pulmonary effort is normal. No respiratory distress.     Comments: Respirations even and unlabored Musculoskeletal:        General: Normal range of motion.     Cervical back: Normal range of motion.  Skin:    General: Skin is warm and dry.     Coloration: Skin is not pale.     Findings: No erythema or rash.  Neurological:     Mental Status: He is alert and oriented to person, place, and time.  Psychiatric:        Behavior: Behavior normal.    ED Results / Procedures / Treatments   Labs (all labs  ordered are listed, but only abnormal results are displayed) Labs Reviewed - No data to display  EKG None  Radiology No results found.  Procedures Dental Block  Date/Time: 07/25/2020 11:43 PM Performed by: Antony Madura, PA-C Authorized by: Antony Madura, PA-C   Consent:    Consent obtained:  Verbal   Consent given by:  Patient   Risks, benefits, and alternatives were discussed: yes     Risks discussed:  Swelling, pain, unsuccessful block and infection   Alternatives discussed:  No treatment Universal protocol:    Patient identity confirmed:  Verbally with patient and arm band Indications:    Indications: dental pain   Location:    Block type:  Supraperiosteal   Supraperiosteal location:  Upper teeth and lower teeth   Upper teeth location:  12/LU 1st bicuspid   Lower teeth location:  20/LL 2nd bicuspid Procedure details:    Needle gauge:  25 G   Anesthetic injected:  Bupivacaine 0.5% w/o epi   Injection procedure:  Anatomic landmarks identified, introduced needle, incremental injection, anatomic landmarks palpated and negative aspiration for blood Post-procedure details:    Outcome:  Anesthesia achieved   Procedure completion:  Tolerated well, no immediate complications   Medications Ordered in ED Medications - No data to display  ED Course  I have reviewed the triage vital signs and the nursing notes.  Pertinent labs & imaging results that were available during my care of the patient were reviewed by me and considered in my medical decision making (see chart for details).    MDM Rules/Calculators/A&P                           Patient with toothache.  No gross abscess.  Exam unconcerning for Ludwig's angina or spread of infection.  Will treat with Amoxicillin and pain medicine.  Urged patient to follow-up with dentist.  Has an appt scheduled this coming Monday.  Return precautions discussed and provided. Patient discharged in stable condition with no unaddressed  concerns.   Final Clinical Impression(s) / ED Diagnoses Final diagnoses:  Dentalgia    Rx / DC Orders ED Discharge Orders          Ordered    amoxicillin (AMOXIL) 500 MG capsule  3 times daily        07/25/20 2323             Antony Madura, PA-C 07/25/20 2348    Long, Arlyss Repress, MD 07/26/20 [id number]

## 2020-07-25 NOTE — Discharge Instructions (Addendum)
Follow-up with a dentist.  Only a dentist can fix your problem.  We recommend that you take amoxicillin as prescribed to treat likely underlying infection.  Take ibuprofen as prescribed for pain control.  Return to the ED for any new or concerning symptoms. 

## 2020-07-25 NOTE — ED Notes (Signed)
Patient called for MSE x2

## 2020-07-25 NOTE — ED Triage Notes (Signed)
Pt c/o left side dental pain. Pt reports pain in his upper and lower jaw.

## 2020-07-25 NOTE — ED Notes (Signed)
Pt discharged from triage with discharge papers in hand without difficulty.

## 2020-07-26 ENCOUNTER — Other Ambulatory Visit (HOSPITAL_COMMUNITY): Payer: Self-pay

## 2020-08-14 ENCOUNTER — Other Ambulatory Visit (HOSPITAL_COMMUNITY): Payer: Self-pay

## 2020-08-16 ENCOUNTER — Other Ambulatory Visit (HOSPITAL_COMMUNITY): Payer: Self-pay

## 2020-08-19 ENCOUNTER — Ambulatory Visit: Payer: Medicaid Other | Admitting: Family

## 2020-08-19 ENCOUNTER — Other Ambulatory Visit (HOSPITAL_COMMUNITY): Payer: Self-pay

## 2020-08-20 ENCOUNTER — Ambulatory Visit: Payer: Medicaid Other | Admitting: Family

## 2020-08-22 ENCOUNTER — Other Ambulatory Visit (HOSPITAL_COMMUNITY): Payer: Self-pay

## 2020-09-10 ENCOUNTER — Encounter: Payer: Self-pay | Admitting: Family

## 2020-09-10 ENCOUNTER — Other Ambulatory Visit (HOSPITAL_COMMUNITY): Payer: Self-pay

## 2020-09-10 ENCOUNTER — Other Ambulatory Visit: Payer: Self-pay

## 2020-09-10 ENCOUNTER — Ambulatory Visit (INDEPENDENT_AMBULATORY_CARE_PROVIDER_SITE_OTHER): Payer: Medicaid Other | Admitting: Family

## 2020-09-10 VITALS — BP 115/69 | HR 71 | Temp 97.9°F | Wt 235.0 lb

## 2020-09-10 DIAGNOSIS — Z113 Encounter for screening for infections with a predominantly sexual mode of transmission: Secondary | ICD-10-CM

## 2020-09-10 DIAGNOSIS — Z21 Asymptomatic human immunodeficiency virus [HIV] infection status: Secondary | ICD-10-CM | POA: Diagnosis present

## 2020-09-10 DIAGNOSIS — Z0000 Encounter for general adult medical examination without abnormal findings: Secondary | ICD-10-CM | POA: Diagnosis not present

## 2020-09-10 MED ORDER — DOVATO 50-300 MG PO TABS
1.0000 | ORAL_TABLET | Freq: Every day | ORAL | 5 refills | Status: DC
  Filled 2020-09-10 – 2020-12-05 (×2): qty 30, 30d supply, fill #0

## 2020-09-10 NOTE — Progress Notes (Signed)
Brief Narrative   Patient ID: Steven [name], male    DOB: 01-01-1991, 30 y.o.   MRN: [id number]  Steven [name] is a 30 y/o AA male with HIV disease diagnosed in June 2018. Genotype without significant resistance. Initial CD4 count was 280 and initial viral load of 55,300. No history of opportunistic infection. No previous treatment regimens prior to Steven [name], Steven [name].   Subjective:    Chief Complaint  Patient presents with   HIV Positive/AIDS    HPI:  Steven [name] is a 30 y.o. male with HIV disease last seen on 05/27/20 with well controlled virus and good adherence and tolerance to his ART regimen of Dovato. Viral load was undetectable and CD4 count of 674. Here today for routine follow up.  Steven [name] continues to take his Dovato daily as prescribed with no adverse side effects or missed doses.  Overall feeling well today with no new concerns/complaints. Denies fevers, chills, night sweats, headaches, changes in vision, neck pain/stiffness, nausea, diarrhea, vomiting, lesions or rashes.  Steven [name] has no problems obtaining medications from the pharmacy and remains covered through [facility].  Denies feelings of being down, depressed, or hopeless recently.  No current recreational or illicit drug use with occasional alcohol consumption and currently smokes approximately 1/2 pack of cigarettes per day.Condoms offered and declined.    No Known Allergies    Outpatient Medications Prior to Visit  Medication Sig Dispense Refill   Dolutegravir-lamiVUDine (DOVATO) 50-300 MG TABS Take 1 tablet by mouth daily. 30 tablet 3   amoxicillin (AMOXIL) 500 MG capsule Take 1 capsule (500 mg total) by mouth 3 (three) times daily. (Patient not taking: Reported on 09/10/2020) 21 capsule 0   No facility-administered medications prior to visit.     Past Medical History:  Diagnosis Date   Depression 07/09/2016   History of syphilis 07/09/2016   HIV (human immunodeficiency virus infection) ([place]) 06/29/2016    Dx 06/10/2016   Kidney stone    kidney stones     History reviewed. No pertinent surgical history.    Review of Systems  Constitutional:  Negative for appetite change, chills, fatigue, fever and unexpected weight change.  Eyes:  Negative for visual disturbance.  Respiratory:  Negative for cough, chest tightness, shortness of breath and wheezing.   Cardiovascular:  Negative for chest pain and leg swelling.  Gastrointestinal:  Negative for abdominal pain, constipation, diarrhea, nausea and vomiting.  Genitourinary:  Negative for dysuria, flank pain, frequency, genital sores, hematuria and urgency.  Skin:  Negative for rash.  Allergic/Immunologic: Negative for immunocompromised state.  Neurological:  Negative for dizziness and headaches.     Objective:    BP 115/69   Pulse 71   Temp 97.9 F (36.6 C) (Oral)   Wt 235 lb (106.6 kg)   BMI 32.78 kg/m  Nursing note and vital signs reviewed.  Physical Exam Constitutional:      General: He is not in acute distress.    Appearance: He is well-developed.  Eyes:     Conjunctiva/sclera: Conjunctivae normal.  Cardiovascular:     Rate and Rhythm: Normal rate and regular rhythm.     Heart sounds: Normal heart sounds. No murmur heard.   No friction rub. No gallop.  Pulmonary:     Effort: Pulmonary effort is normal. No respiratory distress.     Breath sounds: Normal breath sounds. No wheezing or rales.  Chest:     Chest wall: No tenderness.  Abdominal:     General: Bowel sounds  are normal.     Palpations: Abdomen is soft.     Tenderness: There is no abdominal tenderness.  Musculoskeletal:     Cervical back: Neck supple.  Lymphadenopathy:     Cervical: No cervical adenopathy.  Skin:    General: Skin is warm and dry.     Findings: No rash.  Neurological:     Mental Status: He is alert and oriented to person, place, and time.  Psychiatric:        Behavior: Behavior normal.        Thought Content: Thought content normal.         Judgment: Judgment normal.     Depression screen [facility] 2/9 09/10/2020 05/27/2020 04/04/2020 12/18/2016 08/25/2016  Decreased Interest 0 0 0 0 0  Down, Depressed, Hopeless 0 0 0 0 0  PHQ - 2 Score 0 0 0 0 0       Assessment & Plan:    Patient Active Problem List   Diagnosis Date Noted   High risk homosexual behavior 07/18/2018   Constipation 12/18/2016   Sore throat 12/18/2016   Exposure to gonorrhea 08/25/2016   Healthcare maintenance 08/25/2016   History of syphilis 07/09/2016   Outbursts of anger 07/09/2016   HIV (human immunodeficiency virus infection) ([place]) 06/29/2016     Problem List Items Addressed This Visit       Other   HIV (human immunodeficiency virus infection) ([place]) - Primary (Chronic)    Steven [name] continues to have well-controlled virus with good adherence and tolerance to his ART regimen of Dovato.  No signs/symptoms of opportunistic infection or progressive HIV.  We reviewed previous lab work and discussed plan of care.  Check blood work today.  Continue current dose of Dovato.  Plan for follow-up in 4 months or sooner if needed with lab work on the same day.      Relevant Medications   Dolutegravir-lamiVUDine (DOVATO) 50-300 MG TABS   Other Relevant Orders   Comp Met (CMET) (Completed)   HIV 1 RNA quant-no reflex-bld   T-helper cells (CD4) count   Healthcare maintenance    Discussed importance of safe sexual practice to reduce risk of STI.  Condoms offered and declined. Vaccinations up-to-date per recommendation.       Other Visit Diagnoses     Screening for STDs (sexually transmitted diseases)       Relevant Orders   RPR        I have discontinued Steven [name]'s amoxicillin. I am also having him maintain his Dovato.   Meds ordered this encounter  Medications   Dolutegravir-lamiVUDine (DOVATO) 50-300 MG TABS    Sig: Take 1 tablet by mouth daily.    Dispense:  30 tablet    Refill:  5    Order Specific Question:   Supervising Provider     Answer:   Carlyle Basques [[id number]]     Follow-up: Return in about 4 months (around 01/10/2021), or if symptoms worsen or fail to improve.   Terri Piedra, MSN, FNP-C Nurse Practitioner [facility] for Infectious Disease [place] number: [phone]

## 2020-09-10 NOTE — Patient Instructions (Addendum)
Nice to see you.  We will check your lab work today.  Continue to take your medication daily as prescribed.   Refills have been sent to the pharmacy.  Plan for follow up in 4 months or sooner if needed with lab work on the same day.  Have a great day and stay safe!  Happy Birthday!

## 2020-09-11 LAB — T-HELPER CELLS (CD4) COUNT (NOT AT ARMC)
CD4 % Helper T Cell: 32 % — ABNORMAL LOW (ref 33–65)
CD4 T Cell Abs: 721 /uL (ref 400–1790)

## 2020-09-11 NOTE — Assessment & Plan Note (Signed)
   Discussed importance of safe sexual practice to reduce risk of STI.  Condoms offered and declined.  Vaccinations up-to-date per recommendation.

## 2020-09-11 NOTE — Assessment & Plan Note (Addendum)
Steven [name] continues to have well-controlled virus with good adherence and tolerance to his ART regimen of Dovato.  No signs/symptoms of opportunistic infection or progressive HIV.  We reviewed previous lab work and discussed plan of care.  Check blood work today.  Continue current dose of Dovato.  Plan for follow-up in 4 months or sooner if needed with lab work on the same day.

## 2020-09-12 LAB — COMPREHENSIVE METABOLIC PANEL
AG Ratio: 2.2 (calc) (ref 1.0–2.5)
ALT: 17 U/L (ref 9–46)
AST: 18 U/L (ref 10–40)
Albumin: 5 g/dL (ref 3.6–5.1)
Alkaline phosphatase (APISO): 57 U/L (ref 36–130)
BUN: 13 mg/dL (ref 7–25)
CO2: 26 mmol/L (ref 20–32)
Calcium: 9.8 mg/dL (ref 8.6–10.3)
Chloride: 105 mmol/L (ref 98–110)
Creat: 1.07 mg/dL (ref 0.60–1.24)
Globulin: 2.3 g/dL (calc) (ref 1.9–3.7)
Glucose, Bld: 75 mg/dL (ref 65–99)
Potassium: 4.4 mmol/L (ref 3.5–5.3)
Sodium: 138 mmol/L (ref 135–146)
Total Bilirubin: 0.3 mg/dL (ref 0.2–1.2)
Total Protein: 7.3 g/dL (ref 6.1–8.1)

## 2020-09-12 LAB — HIV-1 RNA QUANT-NO REFLEX-BLD
HIV 1 RNA Quant: NOT DETECTED Copies/mL
HIV-1 RNA Quant, Log: NOT DETECTED Log cps/mL

## 2020-09-12 LAB — FLUORESCENT TREPONEMAL AB(FTA)-IGG-BLD: Fluorescent Treponemal ABS: REACTIVE — AB

## 2020-09-12 LAB — RPR: RPR Ser Ql: REACTIVE — AB

## 2020-09-12 LAB — RPR TITER: RPR Titer: 1:1 {titer} — ABNORMAL HIGH

## 2020-09-18 ENCOUNTER — Other Ambulatory Visit (HOSPITAL_COMMUNITY): Payer: Self-pay

## 2020-09-30 ENCOUNTER — Other Ambulatory Visit (HOSPITAL_COMMUNITY): Payer: Self-pay

## 2020-10-02 ENCOUNTER — Other Ambulatory Visit: Payer: Self-pay

## 2020-10-02 ENCOUNTER — Other Ambulatory Visit (HOSPITAL_COMMUNITY): Payer: Self-pay

## 2020-10-02 ENCOUNTER — Encounter (HOSPITAL_COMMUNITY): Payer: Self-pay | Admitting: *Deleted

## 2020-10-02 ENCOUNTER — Ambulatory Visit (HOSPITAL_COMMUNITY)
Admission: EM | Admit: 2020-10-02 | Discharge: 2020-10-02 | Disposition: A | Discharge status: Home / Self Care | Payer: Medicaid Other | Attending: Family Medicine | Admitting: Family Medicine

## 2020-10-02 ENCOUNTER — Telehealth (HOSPITAL_COMMUNITY): Payer: Self-pay

## 2020-10-02 DIAGNOSIS — J069 Acute upper respiratory infection, unspecified: Secondary | ICD-10-CM

## 2020-10-02 DIAGNOSIS — U071 COVID-19: Secondary | ICD-10-CM | POA: Insufficient documentation

## 2020-10-02 DIAGNOSIS — R6883 Chills (without fever): Secondary | ICD-10-CM

## 2020-10-02 DIAGNOSIS — R0602 Shortness of breath: Secondary | ICD-10-CM | POA: Diagnosis not present

## 2020-10-02 DIAGNOSIS — R52 Pain, unspecified: Secondary | ICD-10-CM | POA: Diagnosis not present

## 2020-10-02 DIAGNOSIS — R059 Cough, unspecified: Secondary | ICD-10-CM | POA: Insufficient documentation

## 2020-10-02 DIAGNOSIS — J029 Acute pharyngitis, unspecified: Secondary | ICD-10-CM | POA: Insufficient documentation

## 2020-10-02 LAB — SARS CORONAVIRUS 2 (TAT 6-24 HRS): SARS Coronavirus 2: POSITIVE — AB

## 2020-10-02 MED ORDER — PROMETHAZINE-DM 6.25-15 MG/5ML PO SYRP: 5.0000 mL | ORAL_SOLUTION | Freq: Four times a day (QID) | ORAL | 0 refills | Status: DC | PRN

## 2020-10-02 NOTE — Telephone Encounter (Signed)
Pt not answered when I called to let him know cough medication is not cover by insurance ha can by Delsym OTC.

## 2020-10-02 NOTE — ED Provider Notes (Signed)
[facility]    CSN: [id number] Arrival date & time: 10/02/20  0915      History   Chief Complaint Chief Complaint  Patient presents with   Cough   Generalized Body Aches   Headache   Shortness of Breath    HPI Steven [name] is a 30 y.o. male.   Patient presenting today with 1 day history of progressively worsening sore throat, cough, runny nose, headache, chills, body aches, low-grade fevers.  Denies chest pain, shortness of breath, abdominal pain, nausea vomiting or diarrhea.  So far taking TheraFlu and hot tea with mild temporary relief of symptoms.  No known sick contacts recently.  No known history of pulmonary disease.   Past Medical History:  Diagnosis Date   Depression 07/09/2016   History of syphilis 07/09/2016   HIV (human immunodeficiency virus infection) (HCC) 06/29/2016   Dx 06/10/2016   Kidney stone    kidney stones    Patient Active Problem List   Diagnosis Date Noted   High risk homosexual behavior 07/18/2018   Constipation 12/18/2016   Sore throat 12/18/2016   Exposure to gonorrhea 08/25/2016   Healthcare maintenance 08/25/2016   History of syphilis 07/09/2016   Outbursts of anger 07/09/2016   HIV (human immunodeficiency virus infection) (HCC) 06/29/2016    History reviewed. No pertinent surgical history.     Home Medications    Prior to Admission medications   Medication Sig Start Date End Date Taking? Authorizing Provider  promethazine-dextromethorphan (PROMETHAZINE-DM) 6.25-15 MG/5ML syrup Take 5 mLs by mouth 4 (four) times daily as needed for cough. 10/02/20  Yes Particia Nearing, PA-C  Dolutegravir-lamiVUDine (DOVATO) 50-300 MG TABS Take 1 tablet by mouth daily. 09/10/20   Veryl Speak, FNP    Family History Family History  Problem Relation Age of Onset   Mental illness Mother     Social History Social History   Tobacco Use   Smoking status: Every Day    Packs/day: 0.50    Types: Cigarettes   Smokeless  tobacco: Never  Vaping Use   Vaping Use: Never used  Substance Use Topics   Alcohol use: Yes    Comment: rarely   Drug use: Not Currently    Types: Marijuana     Allergies   Patient has no known allergies.   Review of Systems Review of Systems Per HPI  Physical Exam Triage Vital Signs ED Triage Vitals  Enc Vitals Group     BP 10/02/20 1041 119/80     Pulse Rate 10/02/20 1041 72     Resp 10/02/20 1041 18     Temp 10/02/20 1041 99.3 F (37.4 C)     Temp src --      SpO2 10/02/20 1041 97 %     Weight --      Height --      Head Circumference --      Peak Flow --      Pain Score 10/02/20 1038 10     Pain Loc --      Pain Edu? --      Excl. in GC? --    No data found.  Updated Vital Signs BP 119/80   Pulse 72   Temp 99.3 F (37.4 C)   Resp 18   SpO2 97%   Visual Acuity Right Eye Distance:   Left Eye Distance:   Bilateral Distance:    Right Eye Near:   Left Eye Near:    Bilateral Near:  Physical Exam Vitals and nursing note reviewed.  Constitutional:      Appearance: Normal appearance.  HENT:     Head: Atraumatic.     Right Ear: Tympanic membrane normal.     Left Ear: Tympanic membrane normal.     Nose: Rhinorrhea present.     Mouth/Throat:     Mouth: Mucous membranes are moist.     Pharynx: Posterior oropharyngeal erythema present.  Eyes:     Extraocular Movements: Extraocular movements intact.     Conjunctiva/sclera: Conjunctivae normal.  Cardiovascular:     Rate and Rhythm: Normal rate and regular rhythm.  Pulmonary:     Effort: Pulmonary effort is normal. No respiratory distress.     Breath sounds: Normal breath sounds. No wheezing or rales.  Abdominal:     General: Bowel sounds are normal. There is no distension.     Palpations: Abdomen is soft.     Tenderness: There is no abdominal tenderness. There is no guarding.  Musculoskeletal:        General: Normal range of motion.     Cervical back: Normal range of motion and neck supple.   Skin:    General: Skin is warm and dry.  Neurological:     General: No focal deficit present.     Mental Status: He is oriented to person, place, and time.     Motor: No weakness.     Gait: Gait normal.  Psychiatric:        Mood and Affect: Mood normal.        Thought Content: Thought content normal.        Judgment: Judgment normal.     UC Treatments / Results  Labs (all labs ordered are listed, but only abnormal results are displayed) Labs Reviewed  SARS CORONAVIRUS 2 (TAT 6-24 HRS)    EKG   Radiology No results found.  Procedures Procedures (including critical care time)  Medications Ordered in UC Medications - No data to display  Initial Impression / Assessment and Plan / UC Course  I have reviewed the triage vital signs and the nursing notes.  Pertinent labs & imaging results that were available during my care of the patient were reviewed by me and considered in my medical decision making (see chart for details).     Vitals and exam overall reassuring today, suspect viral illness causing symptoms.  COVID PCR pending, quarantine protocol reviewed and work note given.  We will treat with Phenergan DM, discussed continued over-the-counter supportive medications and home care.  Return for acutely worsening symptoms.  Final Clinical Impressions(s) / UC Diagnoses   Final diagnoses:  Viral URI with cough  Generalized body aches  Chills   Discharge Instructions   None    ED Prescriptions     Medication Sig Dispense Auth. Provider   promethazine-dextromethorphan (PROMETHAZINE-DM) 6.25-15 MG/5ML syrup Take 5 mLs by mouth 4 (four) times daily as needed for cough. 100 mL Particia Nearing, [place]      PDMP not reviewed this encounter.   Particia Nearing, [place] 10/02/20 1129

## 2020-10-02 NOTE — ED Triage Notes (Signed)
Pt reports sore throat,cough ,runny nose and HA since yesterday.

## 2020-10-04 ENCOUNTER — Other Ambulatory Visit (HOSPITAL_COMMUNITY): Payer: Self-pay

## 2020-11-16 ENCOUNTER — Ambulatory Visit (HOSPITAL_COMMUNITY)
Admission: EM | Admit: 2020-11-16 | Discharge: 2020-11-16 | Disposition: A | Discharge status: Home / Self Care | Payer: Medicaid Other | Attending: Emergency Medicine | Admitting: Emergency Medicine

## 2020-11-16 ENCOUNTER — Emergency Department (HOSPITAL_COMMUNITY): Payer: Medicaid Other | Admitting: Anesthesiology

## 2020-11-16 ENCOUNTER — Encounter (HOSPITAL_COMMUNITY)
Admission: EM | Disposition: A | Discharge status: Home / Self Care | Payer: Self-pay | Source: Home / Self Care | Attending: Emergency Medicine

## 2020-11-16 ENCOUNTER — Encounter (HOSPITAL_COMMUNITY): Payer: Self-pay

## 2020-11-16 DIAGNOSIS — S01551A Open bite of lip, initial encounter: Secondary | ICD-10-CM | POA: Diagnosis not present

## 2020-11-16 DIAGNOSIS — F17210 Nicotine dependence, cigarettes, uncomplicated: Secondary | ICD-10-CM | POA: Insufficient documentation

## 2020-11-16 DIAGNOSIS — Z21 Asymptomatic human immunodeficiency virus [HIV] infection status: Secondary | ICD-10-CM | POA: Diagnosis not present

## 2020-11-16 DIAGNOSIS — S01511A Laceration without foreign body of lip, initial encounter: Secondary | ICD-10-CM

## 2020-11-16 DIAGNOSIS — W540XXA Bitten by dog, initial encounter: Secondary | ICD-10-CM | POA: Insufficient documentation

## 2020-11-16 HISTORY — DX: Personal history of urinary calculi: Z87.442

## 2020-11-16 HISTORY — PX: FACIAL LACERATION REPAIR: SHX6589

## 2020-11-16 LAB — CBC WITH DIFFERENTIAL/PLATELET
Abs Immature Granulocytes: 0.02 10*3/uL (ref 0.00–0.07)
Basophils Absolute: 0 10*3/uL (ref 0.0–0.1)
Basophils Relative: 1 %
Eosinophils Absolute: 0.2 10*3/uL (ref 0.0–0.5)
Eosinophils Relative: 3 %
HCT: 41.5 % (ref 39.0–52.0)
Hemoglobin: 14.8 g/dL (ref 13.0–17.0)
Immature Granulocytes: 0 %
Lymphocytes Relative: 41 %
Lymphs Abs: 2.3 10*3/uL (ref 0.7–4.0)
MCH: 30.1 pg (ref 26.0–34.0)
MCHC: 35.7 g/dL (ref 30.0–36.0)
MCV: 84.5 fL (ref 80.0–100.0)
Monocytes Absolute: 0.5 10*3/uL (ref 0.1–1.0)
Monocytes Relative: 9 %
Neutro Abs: 2.5 10*3/uL (ref 1.7–7.7)
Neutrophils Relative %: 46 %
Platelets: 181 10*3/uL (ref 150–400)
RBC: 4.91 MIL/uL (ref 4.22–5.81)
RDW: 12.1 % (ref 11.5–15.5)
WBC: 5.5 10*3/uL (ref 4.0–10.5)
nRBC: 0 % (ref 0.0–0.2)

## 2020-11-16 LAB — BASIC METABOLIC PANEL
Anion gap: 10 (ref 5–15)
BUN: 7 mg/dL (ref 6–20)
CO2: 21 mmol/L — ABNORMAL LOW (ref 22–32)
Calcium: 9.1 mg/dL (ref 8.9–10.3)
Chloride: 102 mmol/L (ref 98–111)
Creatinine, Ser: 1.03 mg/dL (ref 0.61–1.24)
GFR, Estimated: >60 mL/min (ref >60)
Glucose, Bld: 100 mg/dL — ABNORMAL HIGH (ref 70–99)
Potassium: 3.2 mmol/L — ABNORMAL LOW (ref 3.5–5.1)
Sodium: 133 mmol/L — ABNORMAL LOW (ref 135–145)

## 2020-11-16 SURGERY — REPAIR, LACERATION, FACE
Anesthesia: General

## 2020-11-16 MED ORDER — MIDAZOLAM HCL 2 MG/2ML IJ SOLN
INTRAMUSCULAR | Status: DC | PRN
  Administered 2020-11-16: 2 mg via INTRAVENOUS

## 2020-11-16 MED ORDER — ONDANSETRON HCL 4 MG/2ML IJ SOLN
INTRAMUSCULAR | Status: AC
  Filled 2020-11-16: qty 2

## 2020-11-16 MED ORDER — DEXAMETHASONE SODIUM PHOSPHATE 10 MG/ML IJ SOLN
INTRAMUSCULAR | Status: AC
  Filled 2020-11-16: qty 1

## 2020-11-16 MED ORDER — SODIUM CHLORIDE 0.9 % IV SOLN
3.0000 g | INTRAVENOUS | Status: AC
  Administered 2020-11-16: 3 g via INTRAVENOUS
  Filled 2020-11-16 (×2): qty 8

## 2020-11-16 MED ORDER — ROCURONIUM BROMIDE 10 MG/ML (PF) SYRINGE
PREFILLED_SYRINGE | INTRAVENOUS | Status: AC
  Filled 2020-11-16: qty 10

## 2020-11-16 MED ORDER — SODIUM CHLORIDE 0.9 % IV SOLN
3.0000 g | Freq: Once | INTRAVENOUS | Status: AC
  Administered 2020-11-16: 3 g via INTRAVENOUS
  Filled 2020-11-16: qty 8

## 2020-11-16 MED ORDER — BACITRACIN ZINC 500 UNIT/GM EX OINT
TOPICAL_OINTMENT | CUTANEOUS | Status: AC
  Filled 2020-11-16: qty 28.35

## 2020-11-16 MED ORDER — DEXAMETHASONE SODIUM PHOSPHATE 10 MG/ML IJ SOLN
INTRAMUSCULAR | Status: DC | PRN
  Administered 2020-11-16: 10 mg via INTRAVENOUS

## 2020-11-16 MED ORDER — OXYCODONE HCL 5 MG PO TABS: 5.0000 mg | ORAL_TABLET | Freq: Once | ORAL | Status: AC | PRN

## 2020-11-16 MED ORDER — ACETAMINOPHEN 160 MG/5ML PO SOLN
ORAL | Status: AC
  Filled 2020-11-16: qty 40.6

## 2020-11-16 MED ORDER — ACETAMINOPHEN 500 MG PO TABS: 1000.0000 mg | ORAL_TABLET | Freq: Once | ORAL | Status: AC | PRN

## 2020-11-16 MED ORDER — HYDROCODONE-ACETAMINOPHEN 5-325 MG PO TABS: 1.0000 | ORAL_TABLET | ORAL | 0 refills | Status: AC | PRN

## 2020-11-16 MED ORDER — FENTANYL CITRATE (PF) 250 MCG/5ML IJ SOLN
INTRAMUSCULAR | Status: DC | PRN
  Administered 2020-11-16: 150 ug via INTRAVENOUS

## 2020-11-16 MED ORDER — ACETAMINOPHEN 10 MG/ML IV SOLN: 1000.0000 mg | Freq: Once | INTRAVENOUS | Status: DC | PRN

## 2020-11-16 MED ORDER — SUCCINYLCHOLINE CHLORIDE 200 MG/10ML IV SOSY
PREFILLED_SYRINGE | INTRAVENOUS | Status: DC | PRN
  Administered 2020-11-16: 140 mg via INTRAVENOUS

## 2020-11-16 MED ORDER — PROPOFOL 10 MG/ML IV BOLUS
INTRAVENOUS | Status: AC
  Filled 2020-11-16: qty 20

## 2020-11-16 MED ORDER — MORPHINE SULFATE (PF) 4 MG/ML IV SOLN
4.0000 mg | Freq: Once | INTRAVENOUS | Status: AC
  Administered 2020-11-16: 4 mg via INTRAVENOUS
  Filled 2020-11-16: qty 1

## 2020-11-16 MED ORDER — DEXMEDETOMIDINE (PRECEDEX) IN NS 20 MCG/5ML (4 MCG/ML) IV SYRINGE
PREFILLED_SYRINGE | INTRAVENOUS | Status: AC
  Filled 2020-11-16: qty 15

## 2020-11-16 MED ORDER — ACETAMINOPHEN 160 MG/5ML PO SOLN
1000.0000 mg | Freq: Once | ORAL | Status: AC | PRN
  Administered 2020-11-16: 1000 mg via ORAL

## 2020-11-16 MED ORDER — AMOXICILLIN-POT CLAVULANATE 875-125 MG PO TABS: 1.0000 | ORAL_TABLET | Freq: Two times a day (BID) | ORAL | 0 refills | Status: AC

## 2020-11-16 MED ORDER — PROPOFOL 10 MG/ML IV BOLUS
INTRAVENOUS | Status: DC | PRN
  Administered 2020-11-16: 200 mg via INTRAVENOUS

## 2020-11-16 MED ORDER — SODIUM CHLORIDE 0.9 % IV SOLN: 3.0000 g | INTRAVENOUS | Status: DC

## 2020-11-16 MED ORDER — 0.9 % SODIUM CHLORIDE (POUR BTL) OPTIME
TOPICAL | Status: DC | PRN
  Administered 2020-11-16: 1000 mL

## 2020-11-16 MED ORDER — DEXMEDETOMIDINE (PRECEDEX) IN NS 20 MCG/5ML (4 MCG/ML) IV SYRINGE
PREFILLED_SYRINGE | INTRAVENOUS | Status: DC | PRN
  Administered 2020-11-16: 20 ug via INTRAVENOUS

## 2020-11-16 MED ORDER — SUGAMMADEX SODIUM 200 MG/2ML IV SOLN
INTRAVENOUS | Status: DC | PRN
Start: 2020-11-16 — End: 2020-11-16
  Administered 2020-11-16: 200 mg via INTRAVENOUS

## 2020-11-16 MED ORDER — LIDOCAINE 2% (20 MG/ML) 5 ML SYRINGE
INTRAMUSCULAR | Status: AC
  Filled 2020-11-16: qty 5

## 2020-11-16 MED ORDER — FENTANYL CITRATE (PF) 250 MCG/5ML IJ SOLN
INTRAMUSCULAR | Status: AC
  Filled 2020-11-16: qty 5

## 2020-11-16 MED ORDER — LIDOCAINE-EPINEPHRINE 1 %-1:100000 IJ SOLN
INTRAMUSCULAR | Status: AC
  Filled 2020-11-16: qty 1

## 2020-11-16 MED ORDER — LIDOCAINE 2% (20 MG/ML) 5 ML SYRINGE
INTRAMUSCULAR | Status: DC | PRN
  Administered 2020-11-16: 60 mg via INTRAVENOUS

## 2020-11-16 MED ORDER — MIDAZOLAM HCL 2 MG/2ML IJ SOLN
INTRAMUSCULAR | Status: AC
  Filled 2020-11-16: qty 2

## 2020-11-16 MED ORDER — BACITRACIN ZINC 500 UNIT/GM EX OINT
TOPICAL_OINTMENT | CUTANEOUS | Status: DC | PRN
  Administered 2020-11-16: 1 via TOPICAL

## 2020-11-16 MED ORDER — ROCURONIUM BROMIDE 10 MG/ML (PF) SYRINGE
PREFILLED_SYRINGE | INTRAVENOUS | Status: DC | PRN
  Administered 2020-11-16: 40 mg via INTRAVENOUS

## 2020-11-16 MED ORDER — OXYCODONE HCL 5 MG/5ML PO SOLN
5.0000 mg | Freq: Once | ORAL | Status: AC | PRN
  Administered 2020-11-16: 5 mg via ORAL

## 2020-11-16 MED ORDER — FENTANYL CITRATE (PF) 100 MCG/2ML IJ SOLN: 25.0000 ug | INTRAMUSCULAR | Status: DC | PRN

## 2020-11-16 MED ORDER — DOCUSATE SODIUM 100 MG PO CAPS: 100.0000 mg | ORAL_CAPSULE | Freq: Two times a day (BID) | ORAL | 0 refills | Status: AC | PRN

## 2020-11-16 MED ORDER — ONDANSETRON HCL 4 MG/2ML IJ SOLN
INTRAMUSCULAR | Status: DC | PRN
  Administered 2020-11-16: 4 mg via INTRAVENOUS

## 2020-11-16 MED ORDER — GLYCOPYRROLATE 0.2 MG/ML IJ SOLN
INTRAMUSCULAR | Status: DC | PRN
  Administered 2020-11-16 (×2): .1 mg via INTRAVENOUS

## 2020-11-16 MED ORDER — LACTATED RINGERS IV SOLN: INTRAVENOUS | Status: DC

## 2020-11-16 MED ORDER — MORPHINE SULFATE (PF) 4 MG/ML IV SOLN
4.0000 mg | INTRAVENOUS | Status: DC | PRN
  Administered 2020-11-16 (×2): 4 mg via INTRAVENOUS
  Filled 2020-11-16 (×2): qty 1

## 2020-11-16 MED ORDER — LIDOCAINE-EPINEPHRINE 1 %-1:100000 IJ SOLN
INTRAMUSCULAR | Status: DC | PRN
  Administered 2020-11-16: 2 mL

## 2020-11-16 MED ORDER — OXYCODONE HCL 5 MG/5ML PO SOLN
ORAL | Status: AC
  Filled 2020-11-16: qty 5

## 2020-11-16 SURGICAL SUPPLY — 21 items
ADH SKN CLS APL DERMABOND .7 (GAUZE/BANDAGES/DRESSINGS) ×1
BLADE SURG 15 STRL LF DISP TIS (BLADE) ×1 IMPLANT
BLADE SURG 15 STRL SS (BLADE) ×2
CORD BIPOLAR FORCEPS 12FT (ELECTRODE) ×1 IMPLANT
COVER SURGICAL LIGHT HANDLE (MISCELLANEOUS) ×3 IMPLANT
DERMABOND ADVANCED (GAUZE/BANDAGES/DRESSINGS) ×1
DERMABOND ADVANCED .7 DNX12 (GAUZE/BANDAGES/DRESSINGS) IMPLANT
GAUZE 4X4 16PLY ~~LOC~~+RFID DBL (SPONGE) ×2 IMPLANT
GLOVE SURG ENC MOIS LTX SZ6.5 (GLOVE) ×2 IMPLANT
GOWN STRL REUS W/ TWL LRG LVL3 (GOWN DISPOSABLE) ×1 IMPLANT
GOWN STRL REUS W/TWL LRG LVL3 (GOWN DISPOSABLE) ×4
KIT BASIN OR (CUSTOM PROCEDURE TRAY) ×2 IMPLANT
KIT TURNOVER KIT B (KITS) ×2 IMPLANT
NEEDLE HYPO 25GX1X1/2 BEV (NEEDLE) ×2 IMPLANT
NS IRRIG 1000ML POUR BTL (IV SOLUTION) ×2 IMPLANT
PAD ARMBOARD 7.5X6 YLW CONV (MISCELLANEOUS) ×4 IMPLANT
POSITIONER HEAD DONUT 9IN (MISCELLANEOUS) ×1 IMPLANT
SUT CHROMIC 5 0 P 3 (SUTURE) ×1 IMPLANT
SUT MON AB 5-0 PS2 18 (SUTURE) ×1 IMPLANT
SUT VIC AB 4-0 PS2 27 (SUTURE) ×2 IMPLANT
TRAY ENT MC OR (CUSTOM PROCEDURE TRAY) ×2 IMPLANT

## 2020-11-16 NOTE — Anesthesia Preprocedure Evaluation (Signed)
Anesthesia Evaluation  Patient identified by MRN, date of birth, ID band Patient awake    Reviewed: Allergy & Precautions, NPO status , Patient's Chart, lab work & pertinent test results  History of Anesthesia Complications Negative for: history of anesthetic complications  Airway Mallampati: III  TM Distance: >3 FB Neck ROM: Full    Dental  (+) Dental Advisory Given, Teeth Intact   Pulmonary neg shortness of breath, neg sleep apnea, neg COPD, neg recent URI, former smoker,    breath sounds clear to auscultation       Cardiovascular negative cardio ROS   Rhythm:Regular     Neuro/Psych neg Seizures PSYCHIATRIC DISORDERS Depression    GI/Hepatic negative GI ROS, Neg liver ROS,   Endo/Other  negative endocrine ROS  Renal/GU negative Renal ROS     Musculoskeletal negative musculoskeletal ROS (+)   Abdominal   Peds  Hematology  (+) HIV,   Anesthesia Other Findings Lower lip laceration  Reproductive/Obstetrics                             Anesthesia Physical Anesthesia Plan  ASA: 2  Anesthesia Plan: General   Post-op Pain Management:    Induction: Intravenous and Rapid sequence  PONV Risk Score and Plan: 2 and Ondansetron and Dexamethasone  Airway Management Planned: Oral ETT  Additional Equipment: None  Intra-op Plan:   Post-operative Plan: Extubation in OR  Informed Consent: I have reviewed the patients History and Physical, chart, labs and discussed the procedure including the risks, benefits and alternatives for the proposed anesthesia with the patient or authorized representative who has indicated his/her understanding and acceptance.     Dental advisory given  Plan Discussed with: CRNA and Anesthesiologist  Anesthesia Plan Comments:         Anesthesia Quick Evaluation

## 2020-11-16 NOTE — ED Provider Notes (Signed)
[facility] EMERGENCY DEPARTMENT Provider Note   CSN: [id number] Arrival date & time: 11/16/20  0050     History Chief Complaint  Patient presents with   Animal Bite    Steven [name] is a 30 y.o. male.  Patient is a 30 year old male with past medical history of HIV disease, kidney stones.  Patient presenting today for evaluation for dog bite.  He was apparently bit by a stray dog to the lower lip causing a large laceration.  He denies any other injury.  Last tetanus unknown.  The history is provided by the patient.  Animal Bite Contact animal:  Dog Location:  Mouth Time since incident:  1 hour Provoked: unprovoked   Relieved by:  Nothing Worsened by:  Nothing Ineffective treatments:  None tried     Past Medical History:  Diagnosis Date   Depression 07/09/2016   History of syphilis 07/09/2016   HIV (human immunodeficiency virus infection) (HCC) 06/29/2016   Dx 06/10/2016   Kidney stone    kidney stones    Patient Active Problem List   Diagnosis Date Noted   High risk homosexual behavior 07/18/2018   Constipation 12/18/2016   Sore throat 12/18/2016   Exposure to gonorrhea 08/25/2016   Healthcare maintenance 08/25/2016   History of syphilis 07/09/2016   Outbursts of anger 07/09/2016   HIV (human immunodeficiency virus infection) (HCC) 06/29/2016    No past surgical history on file.     Family History  Problem Relation Age of Onset   Mental illness Mother     Social History   Tobacco Use   Smoking status: Every Day    Packs/day: 0.50    Types: Cigarettes   Smokeless tobacco: Never  Vaping Use   Vaping Use: Never used  Substance Use Topics   Alcohol use: Yes    Comment: rarely   Drug use: Not Currently    Types: Marijuana    Home Medications Prior to Admission medications   Medication Sig Start Date End Date Taking? Authorizing Provider  Dolutegravir-lamiVUDine (DOVATO) 50-300 MG TABS Take 1 tablet by mouth daily. 09/10/20    Veryl Speak, FNP  promethazine-dextromethorphan (PROMETHAZINE-DM) 6.25-15 MG/5ML syrup Take 5 mLs by mouth 4 (four) times daily as needed for cough. 10/02/20   Particia Nearing, PA-C    Allergies    Patient has no known allergies.  Review of Systems   Review of Systems  All other systems reviewed and are negative.  Physical Exam Updated Vital Signs BP 124/80   Pulse 90   Temp 97.6 F (36.4 C)   Resp 18   SpO2 97%   Physical Exam Vitals and nursing note reviewed.  Constitutional:      General: He is not in acute distress.    Appearance: Normal appearance. He is not ill-appearing.  HENT:     Head: Normocephalic.     Comments: There is a large, gaping laceration to the lower lip that is full-thickness.  See photo below. Pulmonary:     Effort: Pulmonary effort is normal.  Skin:    General: Skin is warm and dry.  Neurological:     General: No focal deficit present.     Mental Status: He is alert and oriented to person, place, and time.      ED Results / Procedures / Treatments   Labs (all labs ordered are listed, but only abnormal results are displayed) Labs Reviewed - No data to display  EKG None  Radiology No  results found.  Procedures Procedures   Medications Ordered in ED Medications  Ampicillin-Sulbactam (UNASYN) 3 g in sodium chloride 0.9 % 100 mL IVPB (has no administration in time range)    ED Course  I have reviewed the triage vital signs and the nursing notes.  Pertinent labs & imaging results that were available during my care of the patient were reviewed by me and considered in my medical decision making (see chart for details).    MDM Rules/Calculators/A&P  Patient with a complex laceration to the lower lip after being bitten by a dog.  Last tetanus within the past year.  Unasyn administered intravenously.  Care discussed with Dr. Marene Lenz from ENT.  She will take the patient to the operating room in the morning for  repair.  Final Clinical Impression(s) / ED Diagnoses Final diagnoses:  None    Rx / DC Orders ED Discharge Orders     None        Geoffery Lyons, MD 11/16/20 [phone]

## 2020-11-16 NOTE — ED Notes (Signed)
Lacerated lower lip by dogbite no active bleeding now c/o pain  no loose teeth

## 2020-11-16 NOTE — ED Triage Notes (Signed)
Pt arrives POV for eval of large bite to lower lip. Pt reports he was bitten by a stray dog in the face just PTA. Towel to face in triage, pt tearful and hyperventilating. Moved to Tra B for further evaluation

## 2020-11-16 NOTE — Op Note (Signed)
OPERATIVE NOTE  Clarkson Rosselli Date/Time of Admission: 11/16/2020 12:54 AM  CSN: [id number];OJJ:[id number] Attending Provider: No att. providers found Room/Bed: MCPO/NONE DOB: [birth year] Age: 30 y.o.   Pre-Op Diagnosis: Complex Lip Laceration  Post-Op Diagnosis: Complex Lip Laceration  Procedure: Procedure(s): REPAIR OF COMPLEX  LIP LACERATION (93716)  Anesthesia: General  Surgeon(s): Caresse Sedivy A Sumaya Riedesel, DO  Staff: Circulator: Keenan Bachelor, RN Scrub Person: Carollee Leitz  Implants: * No implants in log *  Specimens: * No specimens in log *  Complications: None  EBL: 10 ML  Condition: stable  Operative Findings:  5cm full thickness laceration of the lower lip, extending beyond the vermilion border and into the skin above the mentum   Description of Operation: The patient was brought to the operating room on 11/16/2020 and placed in supine position on the operating table. General endotracheal anesthesia was established without difficulty. When the patient was adequately anesthetized, surgical timeout was performed and correct identification of the patient and the surgical procedure was done. The patient was then appropriately positioned and prepped and draped in sterile fashion.  The patient's wound was carefully examined and cleaned of foreign material using Betadine surgical prep. The jagged wound edges were then debrided using a 15 blade. Bleeding was controlled using bipolar cautery. The tissues were then copiously irrigated. The deep mucosa was approximated using 4-0 Vicryl in an interrupted fashion. The superficial red lip mucosa was then approximated using 5-0 chromic suture in a running locking fashion extending through the vermilion border, ensuring careful alignment of the red-white lip margin. The avulsed skin inferior to the lip was similarly approximated, using 4-0 vicryl in the deep layer. 5-0 Monocryl was used in an interrupted fashion to approximate the  epidermis. Bacitracin ointment was then applied to the suture line.   An orogastric tube was passed and stomach contents were aspirated. Patient was awakened from anesthetic and transferred from the operating room to the recovery room in stable condition.    Laren Boom, DO [facility]  11/16/2020

## 2020-11-16 NOTE — Interval H&P Note (Signed)
History and Physical Interval Note:  11/16/2020 10:19 AM  Steven [name]  has presented today for surgery, with the diagnosis of Lip laceration.  The various methods of treatment have been discussed with the patient and family. After consideration of risks, benefits and other options for treatment, the patient has consented to  Procedure(s): REPAIR OF COMPLEX  LIP LACERATION (N/A) as a surgical intervention.  The patient's history has been reviewed, patient examined, no change in status, stable for surgery.  I have reviewed the patient's chart and labs.  Questions were answered to the patient's satisfaction.     Fate Caster A Kiven Vangilder

## 2020-11-16 NOTE — Transfer of Care (Signed)
Immediate Anesthesia Transfer of Care Note  Patient: Steven [name]  Procedure(s) Performed: REPAIR OF COMPLEX  LIP LACERATION  Patient Location: PACU  Anesthesia Type:General  Level of Consciousness: awake, alert  and patient cooperative  Airway & Oxygen Therapy: Patient Spontanous Breathing and Patient connected to face mask oxygen  Post-op Assessment: Report given to RN and Post -op Vital signs reviewed and stable  Post vital signs: Reviewed and stable  Last Vitals:  Vitals Value Taken Time  BP 104/69 11/16/20 1225  Temp    Pulse 79 11/16/20 1227  Resp 7 11/16/20 1227  SpO2 97 % 11/16/20 1227  Vitals shown include unvalidated device data.  Last Pain:  Vitals:   11/16/20 1028  TempSrc:   PainSc: 10-Worst pain ever         Complications: No notable events documented.

## 2020-11-16 NOTE — Anesthesia Procedure Notes (Signed)
Procedure Name: Intubation Date/Time: 11/16/2020 11:07 AM Performed by: Inda Coke, CRNA Pre-anesthesia Checklist: Patient identified, Emergency Drugs available, Suction available and Patient being monitored Patient Re-evaluated:Patient Re-evaluated prior to induction Oxygen Delivery Method: Circle System Utilized Preoxygenation: Pre-oxygenation with 100% oxygen Induction Type: IV induction Ventilation: Mask ventilation without difficulty Laryngoscope Size: Mac and 4 Grade View: Grade II Tube type: Oral Tube size: 7.5 mm Number of attempts: 1 Airway Equipment and Method: Stylet and Oral airway Placement Confirmation: ETT inserted through vocal cords under direct vision, positive ETCO2 and breath sounds checked- equal and bilateral Secured at: 23 cm Tube secured with: Tape Dental Injury: Teeth and Oropharynx as per pre-operative assessment

## 2020-11-16 NOTE — H&P (View-Only) (Signed)
ENT CONSULT:  Reason for Consult: Lip laceration  Referring Physician:  Dr. Judd Lien - ED Provider  HPI: Jonah Gingras is an 30 y.o. male who presented to the ED with complaints of dog bite.  Patient states that he was bit by a stray dog causing a large laceration.  No other injury was sustained.  While in the ED he was given Unasyn.  Patient states that his last tetanus was within the past year.   Past Medical History:  Diagnosis Date   Depression 07/09/2016   History of syphilis 07/09/2016   HIV (human immunodeficiency virus infection) (HCC) 06/29/2016   Dx 06/10/2016   Kidney stone    kidney stones    History reviewed. No pertinent surgical history.  Family History  Problem Relation Age of Onset   Mental illness Mother     Social History:  reports that he has been smoking cigarettes. He has been smoking an average of .5 packs per day. He has never used smokeless tobacco. He reports current alcohol use. He reports that he does not currently use drugs after having used the following drugs: Marijuana.  Allergies: No Known Allergies  Medications: I have reviewed the patient's current medications.  Results for orders placed or performed during the hospital encounter of 11/16/20 (from the past 48 hour(s))  Basic metabolic panel     Status: Abnormal   Collection Time: 11/16/20  1:51 AM  Result Value Ref Range   Sodium 133 (L) 135 - 145 mmol/L   Potassium 3.2 (L) 3.5 - 5.1 mmol/L   Chloride 102 98 - 111 mmol/L   CO2 21 (L) 22 - 32 mmol/L   Glucose, Bld 100 (H) 70 - 99 mg/dL    Comment: Glucose reference range applies only to samples taken after fasting for at least 8 hours.   BUN 7 6 - 20 mg/dL   Creatinine, Ser 7.82 0.61 - 1.24 mg/dL   Calcium 9.1 8.9 - 95.6 mg/dL   GFR, Estimated >21 >30 mL/min    Comment: (NOTE) Calculated using the CKD-EPI Creatinine Equation (2021)    Anion gap 10 5 - 15    Comment: Performed at [facility], [address] [place], [place] [address]   CBC with Differential     Status: None   Collection Time: 11/16/20  1:51 AM  Result Value Ref Range   WBC 5.5 4.0 - 10.5 K/uL   RBC 4.91 4.22 - 5.81 MIL/uL   Hemoglobin 14.8 13.0 - 17.0 g/dL   HCT 46.9 62.9 - 52.8 %   MCV 84.5 80.0 - 100.0 fL   MCH 30.1 26.0 - 34.0 pg   MCHC 35.7 30.0 - 36.0 g/dL   RDW 41.3 24.4 - 01.0 %   Platelets 181 150 - 400 K/uL   nRBC 0.0 0.0 - 0.2 %   Neutrophils Relative % 46 %   Neutro Abs 2.5 1.7 - 7.7 K/uL   Lymphocytes Relative 41 %   Lymphs Abs 2.3 0.7 - 4.0 K/uL   Monocytes Relative 9 %   Monocytes Absolute 0.5 0.1 - 1.0 K/uL   Eosinophils Relative 3 %   Eosinophils Absolute 0.2 0.0 - 0.5 K/uL   Basophils Relative 1 %   Basophils Absolute 0.0 0.0 - 0.1 K/uL   Immature Granulocytes 0 %   Abs Immature Granulocytes 0.02 0.00 - 0.07 K/uL    Comment: Performed at [facility], [address], [place], [place] [address]    No results  found.  ROS:ROS  Blood pressure 116/83, pulse 70, temperature 97.6 F (36.4 C), temperature source Oral, resp. rate 17, height 5\' 11"  (1.803 m), weight 107 kg, SpO2 97 %.  PHYSICAL EXAM CONSTITUTIONAL: well developed, nourished, no acute distress and alert and oriented x 3 PULMONARY/CHEST WALL: effort normal and no stridor, no stertor, no dysphonia HENT: Head : Full-thickness laceration of the right aspect of the lower lip with associated edema Nose: nose normal and no purulence Mouth/Throat:  Mouth: uvula midline and no oral lesions Throat: oropharynx clear and moist Mucous membranes: normal EYES: conjunctiva normal, EOM normal and PERRL NECK: supple, trachea normal and no thyromegaly or cervical LAD  Studies Reviewed:None  Assessment/Plan: Traye Bates is a 30 y/o male with full-thickness lower lip laceration from an animal bite.  To OR for repair of complex lower lip laceration.  Risks of surgery, including persistent deformity, scar, paresthesia, infection, bleeding were reviewed with patient, who  expressed understanding and agreement.  26, DO Otolaryngology [facility] Cell: [phone]   11/16/2020, 10:15 AM

## 2020-11-16 NOTE — Consult Note (Signed)
ENT CONSULT:  Reason for Consult: Lip laceration  Referring Physician:  Dr. Judd Lien - ED Provider  HPI: Steven [name] is an 30 y.o. male who presented to the ED with complaints of dog bite.  Patient states that he was bit by a stray dog causing a large laceration.  No other injury was sustained.  While in the ED he was given Unasyn.  Patient states that his last tetanus was within the past year.   Past Medical History:  Diagnosis Date   Depression 07/09/2016   History of syphilis 07/09/2016   HIV (human immunodeficiency virus infection) (HCC) 06/29/2016   Dx 06/10/2016   Kidney stone    kidney stones    History reviewed. No pertinent surgical history.  Family History  Problem Relation Age of Onset   Mental illness Mother     Social History:  reports that he has been smoking cigarettes. He has been smoking an average of .5 packs per day. He has never used smokeless tobacco. He reports current alcohol use. He reports that he does not currently use drugs after having used the following drugs: Marijuana.  Allergies: No Known Allergies  Medications: I have reviewed the patient's current medications.  Results for orders placed or performed during the hospital encounter of 11/16/20 (from the past 48 hour(s))  Basic metabolic panel     Status: Abnormal   Collection Time: 11/16/20  1:51 AM  Result Value Ref Range   Sodium 133 (L) 135 - 145 mmol/L   Potassium 3.2 (L) 3.5 - 5.1 mmol/L   Chloride 102 98 - 111 mmol/L   CO2 21 (L) 22 - 32 mmol/L   Glucose, Bld 100 (H) 70 - 99 mg/dL    Comment: Glucose reference range applies only to samples taken after fasting for at least 8 hours.   BUN 7 6 - 20 mg/dL   Creatinine, Ser 7.82 0.61 - 1.24 mg/dL   Calcium 9.1 8.9 - 95.6 mg/dL   GFR, Estimated >21 >30 mL/min    Comment: (NOTE) Calculated using the CKD-EPI Creatinine Equation (2021)    Anion gap 10 5 - 15    Comment: Performed at [facility], [address] [place], [place] [address]   CBC with Differential     Status: None   Collection Time: 11/16/20  1:51 AM  Result Value Ref Range   WBC 5.5 4.0 - 10.5 K/uL   RBC 4.91 4.22 - 5.81 MIL/uL   Hemoglobin 14.8 13.0 - 17.0 g/dL   HCT 46.9 62.9 - 52.8 %   MCV 84.5 80.0 - 100.0 fL   MCH 30.1 26.0 - 34.0 pg   MCHC 35.7 30.0 - 36.0 g/dL   RDW 41.3 24.4 - 01.0 %   Platelets 181 150 - 400 K/uL   nRBC 0.0 0.0 - 0.2 %   Neutrophils Relative % 46 %   Neutro Abs 2.5 1.7 - 7.7 K/uL   Lymphocytes Relative 41 %   Lymphs Abs 2.3 0.7 - 4.0 K/uL   Monocytes Relative 9 %   Monocytes Absolute 0.5 0.1 - 1.0 K/uL   Eosinophils Relative 3 %   Eosinophils Absolute 0.2 0.0 - 0.5 K/uL   Basophils Relative 1 %   Basophils Absolute 0.0 0.0 - 0.1 K/uL   Immature Granulocytes 0 %   Abs Immature Granulocytes 0.02 0.00 - 0.07 K/uL    Comment: Performed at [facility], [address], [place], [place] [address]    No results  found.  ROS:ROS  Blood pressure 116/83, pulse 70, temperature 97.6 F (36.4 C), temperature source Oral, resp. rate 17, height 5\' 11"  (1.803 m), weight 107 kg, SpO2 97 %.  PHYSICAL EXAM CONSTITUTIONAL: well developed, nourished, no acute distress and alert and oriented x 3 PULMONARY/CHEST WALL: effort normal and no stridor, no stertor, no dysphonia HENT: Head : Full-thickness laceration of the right aspect of the lower lip with associated edema Nose: nose normal and no purulence Mouth/Throat:  Mouth: uvula midline and no oral lesions Throat: oropharynx clear and moist Mucous membranes: normal EYES: conjunctiva normal, EOM normal and PERRL NECK: supple, trachea normal and no thyromegaly or cervical LAD  Studies Reviewed:None  Assessment/Plan: Steven [name] is a 30 y/o male with full-thickness lower lip laceration from an animal bite.  To OR for repair of complex lower lip laceration.  Risks of surgery, including persistent deformity, scar, paresthesia, infection, bleeding were reviewed with patient, who  expressed understanding and agreement.  28, DO Otolaryngology [facility] E[facility] Cell: [phone]   11/16/2020, 10:15 AM

## 2020-11-16 NOTE — ED Notes (Signed)
Pt in gown, consent signed and at bedside. Belongings bagged and labeled. Earrings and necklace in cups, labeled and placed in belongings bag. Phone remains with pt in bed.

## 2020-11-16 NOTE — Anesthesia Postprocedure Evaluation (Signed)
Anesthesia Post Note  Patient: [name]  Procedure(s) Performed: REPAIR OF COMPLEX  LIP LACERATION     Patient location during evaluation: PACU Anesthesia Type: General Level of consciousness: awake and alert Pain management: pain level controlled Vital Signs Assessment: post-procedure vital signs reviewed and stable Respiratory status: spontaneous breathing, nonlabored ventilation, respiratory function stable and patient connected to nasal cannula oxygen Cardiovascular status: blood pressure returned to baseline and stable Postop Assessment: no apparent nausea or vomiting Anesthetic complications: no   No notable events documented.  Last Vitals:  Vitals:   11/16/20 1240 11/16/20 1300  BP: 105/72 103/66  Pulse: 81 70  Resp: 13 13  Temp:    SpO2: 93% 95%    Last Pain:  Vitals:   11/16/20 1330  TempSrc:   PainSc: Asleep                 Eisley Barber

## 2020-12-05 ENCOUNTER — Encounter: Payer: Self-pay | Admitting: Family

## 2020-12-05 ENCOUNTER — Other Ambulatory Visit (HOSPITAL_COMMUNITY): Payer: Self-pay

## 2020-12-26 ENCOUNTER — Other Ambulatory Visit (HOSPITAL_COMMUNITY): Payer: Self-pay

## 2021-01-01 ENCOUNTER — Encounter (HOSPITAL_COMMUNITY): Payer: Self-pay

## 2021-01-01 ENCOUNTER — Ambulatory Visit (HOSPITAL_COMMUNITY)
Admission: EM | Admit: 2021-01-01 | Discharge: 2021-01-01 | Disposition: A | Discharge status: Home / Self Care | Payer: Medicaid Other | Attending: Internal Medicine | Admitting: Internal Medicine

## 2021-01-01 ENCOUNTER — Other Ambulatory Visit: Payer: Self-pay

## 2021-01-01 DIAGNOSIS — L03011 Cellulitis of right finger: Secondary | ICD-10-CM | POA: Diagnosis not present

## 2021-01-01 DIAGNOSIS — L03019 Cellulitis of unspecified finger: Secondary | ICD-10-CM

## 2021-01-01 MED ORDER — CLINDAMYCIN HCL 300 MG PO CAPS: 300.0000 mg | ORAL_CAPSULE | Freq: Three times a day (TID) | ORAL | 0 refills | Status: DC

## 2021-01-01 MED ORDER — ACETAMINOPHEN 325 MG PO TABS
ORAL_TABLET | ORAL | Status: AC
  Filled 2021-01-01: qty 3

## 2021-01-01 MED ORDER — METHYLPREDNISOLONE SODIUM SUCC 125 MG IJ SOLR
INTRAMUSCULAR | Status: AC
  Filled 2021-01-01: qty 2

## 2021-01-01 NOTE — ED Triage Notes (Signed)
Pt presents with right pinky finger pain and swelling x 3 weeks. He does not recalled any injury to his RT pinky finger.

## 2021-01-01 NOTE — ED Provider Notes (Signed)
[facility]    CSN: [id number] Arrival date & time: 01/01/21  1533      History   Chief Complaint Chief Complaint  Patient presents with   Finger Injury    HPI Steven [name] is a 30 y.o. male who presents with pain and swelling of his R small finger x 3 weeks, and getting worse this week. Has hx of staph infections.     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   Constipation 12/18/2016   Sore throat 12/18/2016   Exposure to gonorrhea 08/25/2016   Healthcare maintenance 08/25/2016   History of syphilis 07/09/2016   Outbursts of anger 07/09/2016   HIV (human immunodeficiency virus infection) (HCC) 06/29/2016    Past Surgical History:  Procedure Laterality Date   FACIAL LACERATION REPAIR N/A 11/16/2020   Procedure: REPAIR OF COMPLEX  LIP LACERATION;  Surgeon: Laren Boom, DO;  Location: [facility] OR;  Service: ENT;  Laterality: N/A;   FINGER SURGERY Left    ring finger   NOSE SURGERY         Home Medications    Prior to Admission medications   Medication Sig Start Date End Date Taking? Authorizing Provider  clindamycin (CLEOCIN) 300 MG capsule Take 1 capsule (300 mg total) by mouth 3 (three) times daily. 01/01/21  Yes Rodriguez-Southworth, Nettie Elm, PA-C  dolutegravir-lamiVUDine (DOVATO) 50-300 MG tablet Take 1 tablet by mouth daily. 09/10/20   Veryl Speak, FNP  OVER THE COUNTER MEDICATION Take 1 tablet by mouth daily. Brain awake    [provider]  OVER THE COUNTER MEDICATION Take 2 tablets by mouth daily. Vitamin for mental health and nervous system    [provider]    Family History Family History  Problem Relation Age of Onset    Mental illness Mother     Social History Social History   Tobacco Use   Smoking status: Former    Packs/day: 0.50    Types: Cigarettes    Quit date: 01/06/2020    Years since quitting: 0.9   Smokeless tobacco: Never  Vaping Use   Vaping Use: Never used  Substance Use Topics   Alcohol use: Yes    Comment: socially   Drug use: Not Currently    Types: Marijuana     Allergies   Patient has no known allergies.   Review of Systems Review of Systems  Constitutional:  Negative for chills, diaphoresis and fever.  Musculoskeletal:  Negative for joint swelling and myalgias.  Skin:  Negative for wound.  Neurological:  Negative for headaches.    Physical Exam Triage Vital Signs ED Triage Vitals [01/01/21 1810]  Enc Vitals Group     BP 118/86     Pulse Rate 76     Resp 16     Temp 97.7 F (36.5 C)     Temp Source Oral     SpO2 100 %     Weight      Height      Head Circumference      Peak Flow      Pain Score 4     Pain Loc  Pain Edu?      Excl. in GC?    No data found.  Updated Vital Signs BP 118/86 (BP Location: Left Arm)    Pulse 76    Temp 97.7 F (36.5 C) (Oral)    Resp 16    SpO2 100%   Visual Acuity Right Eye Distance:   Left Eye Distance:   Bilateral Distance:    Right Eye Near:   Left Eye Near:    Bilateral Near:     Physical Exam Vitals and nursing note reviewed.  Constitutional:      General: He is not in acute distress.    Appearance: He is obese. He is not toxic-appearing.  HENT:     Right Ear: External ear normal.     Left Ear: External ear normal.  Eyes:     General: No scleral icterus.    Conjunctiva/sclera: Conjunctivae normal.  Pulmonary:     Effort: Pulmonary effort is normal.  Musculoskeletal:        General: Normal range of motion.     Cervical back: Neck supple.  Skin:    General: Skin is warm and dry.     Comments: R SMALL FINGER- with warmth and swelling around and below nail bed and lateral to the sides of the  nail. No streaking noted.   Neurological:     Mental Status: He is alert and oriented to person, place, and time.     Gait: Gait normal.  Psychiatric:        Mood and Affect: Mood normal.        Behavior: Behavior normal.        Thought Content: Thought content normal.        Judgment: Judgment normal.     UC Treatments / Results  Labs (all labs ordered are listed, but only abnormal results are displayed) Labs Reviewed - No data to display  EKG   Radiology No results found.  Procedures Procedures (including critical care time)  Medications Ordered in UC Medications - No data to display  Initial Impression / Assessment and Plan / UC Course  I have reviewed the triage vital signs and the nursing notes.  Paronychia of R small finger I placed him on Clindamycin as noted. See instructions.  Final Clinical Impressions(s) / UC Diagnoses   Final diagnoses:  Paronychia of little finger     Discharge Instructions      Soak your finger in Epson salts for 20 minutes 3-4 times a day for 5 days If the swelling gets worse in 2-3 days , come back     ED Prescriptions     Medication Sig Dispense Auth. Provider   clindamycin (CLEOCIN) 300 MG capsule Take 1 capsule (300 mg total) by mouth 3 (three) times daily. 30 capsule Rodriguez-Southworth, Nettie Elm, PA-C      PDMP not reviewed this encounter.   Garey Ham, Cordelia Poche 01/01/21 1839

## 2021-01-01 NOTE — Discharge Instructions (Signed)
Soak your finger in Epson salts for 20 minutes 3-4 times a day for 5 days If the swelling gets worse in 2-3 days , come back

## 2021-01-03 ENCOUNTER — Other Ambulatory Visit (HOSPITAL_COMMUNITY): Payer: Self-pay

## 2021-01-10 ENCOUNTER — Other Ambulatory Visit (HOSPITAL_COMMUNITY): Payer: Self-pay

## 2021-01-10 ENCOUNTER — Ambulatory Visit (INDEPENDENT_AMBULATORY_CARE_PROVIDER_SITE_OTHER): Payer: Medicaid Other | Admitting: Family

## 2021-01-10 ENCOUNTER — Encounter: Payer: Self-pay | Admitting: Family

## 2021-01-10 ENCOUNTER — Other Ambulatory Visit: Payer: Self-pay

## 2021-01-10 VITALS — BP 137/89 | HR 98 | Temp 98.3°F | Resp 16 | Ht 71.0 in | Wt 239.0 lb

## 2021-01-10 DIAGNOSIS — Z21 Asymptomatic human immunodeficiency virus [HIV] infection status: Secondary | ICD-10-CM | POA: Diagnosis present

## 2021-01-10 DIAGNOSIS — Z23 Encounter for immunization: Secondary | ICD-10-CM | POA: Diagnosis not present

## 2021-01-10 DIAGNOSIS — Z113 Encounter for screening for infections with a predominantly sexual mode of transmission: Secondary | ICD-10-CM | POA: Diagnosis not present

## 2021-01-10 DIAGNOSIS — Z0000 Encounter for general adult medical examination without abnormal findings: Secondary | ICD-10-CM

## 2021-01-10 DIAGNOSIS — Z79899 Other long term (current) drug therapy: Secondary | ICD-10-CM | POA: Diagnosis not present

## 2021-01-10 MED ORDER — DOVATO 50-300 MG PO TABS
1.0000 | ORAL_TABLET | Freq: Every day | ORAL | 5 refills | Status: DC
  Filled 2021-01-10 – 2021-01-20 (×2): qty 30, 30d supply, fill #0
  Filled 2021-03-15 – 2021-03-27 (×2): qty 30, 30d supply, fill #1
  Filled 2021-04-15: qty 30, 30d supply, fill #2
  Filled 2021-05-15: qty 30, 30d supply, fill #3

## 2021-01-10 NOTE — Assessment & Plan Note (Signed)
·   Discussed importance of safe sexual practice and condom use.  Condoms offered.  Influenza vaccine updated.  Declines other vaccines.

## 2021-01-10 NOTE — Progress Notes (Signed)
Brief Narrative   Patient ID: Steven [name], male    DOB: 04/01/1990, 31 y.o.   MRN: [id number]  Mr. Steven [name] is [name] 31 y/o AA male with HIV disease diagnosed in June 2018. Genotype without significant resistance. Initial CD4 count was 280 and initial viral load of 55,300. No history of opportunistic infection. No previous treatment regimens prior to PowderlyBiktarvy, Symtuza.   Subjective:    Chief Complaint  Patient presents with   Follow-up    B20     HPI:  Steven [name] is [name] 31 y.o. male who is HIV disease last seen on 09/10/2020 with well-controlled virus and good adherence and tolerance to his ART regimen Dovato.  Viral load was undetectable and CD4 count was 721.  RPR was serofast at 1: 1 down from treatment of 1: 8.  Here today for routine follow-up.  Mr. Steven [name] continues to take his Dovato daily as prescribed with no adverse side effects.  Overall feeling well today with no new concerns/complaints. Denies fevers, chills, night sweats, headaches, changes in vision, neck pain/stiffness, nausea, diarrhea, vomiting, lesions or rashes.  Mr. Steven [name] has no problems obtaining medication from the pharmacy remains covered by Medicaid.  Denies feelings of being down, depressed, or hopeless recently.  Continues to use marijuana on occasion and cigars some days.  No current alcohol consumption.  Condoms offered.  Healthcare maintenance due includes influenza vaccination.  No Known Allergies    Outpatient Medications Prior to Visit  Medication Sig Dispense Refill   OVER THE COUNTER MEDICATION Take 1 tablet by mouth daily. Brain awake     OVER THE COUNTER MEDICATION Take 2 tablets by mouth daily. Vitamin for mental health and nervous system     dolutegravir-lamiVUDine (DOVATO) 50-300 MG tablet Take 1 tablet by mouth daily. 30 tablet 5   clindamycin (CLEOCIN) 300 MG capsule Take 1 capsule (300 mg total) by mouth 3 (three) times daily. (Patient not taking: Reported on 01/10/2021) 30 capsule 0   No  facility-administered medications prior to visit.     Past Medical History:  Diagnosis Date   Depression 07/09/2016   History of kidney stones    History of syphilis 07/09/2016   HIV (human immunodeficiency virus infection) (HCC) 06/29/2016   Dx 06/10/2016   Kidney stone    kidney stones     Past Surgical History:  Procedure Laterality Date   FACIAL LACERATION REPAIR N/[name] 11/16/2020   Procedure: REPAIR OF COMPLEX  LIP LACERATION;  Surgeon: Laren BoomSkotnicki, Steven A, DO;  Location: [facility] OR;  Service: ENT;  Laterality: N/[name];   FINGER SURGERY Left    ring finger   NOSE SURGERY        Review of Systems  Constitutional:  Negative for appetite change, chills, fatigue, fever and unexpected weight change.  Eyes:  Negative for visual disturbance.  Respiratory:  Negative for cough, chest tightness, shortness of breath and wheezing.   Cardiovascular:  Negative for chest pain and leg swelling.  Gastrointestinal:  Negative for abdominal pain, constipation, diarrhea, nausea and vomiting.  Genitourinary:  Negative for dysuria, flank pain, frequency, genital sores, hematuria and urgency.  Skin:  Negative for rash.  Allergic/Immunologic: Negative for immunocompromised state.  Neurological:  Negative for dizziness and headaches.     Objective:    BP 137/89    Pulse 98    Temp 98.3 F (36.8 C) (Oral)    Resp 16    Ht 5\' 11"  (1.803 m)    Wt 239 lb (108.4 kg)  SpO2 98%    BMI 33.33 kg/m  Nursing note and vital signs reviewed.  Physical Exam Constitutional:      General: He is not in acute distress.    Appearance: He is well-developed.  Eyes:     Conjunctiva/sclera: Conjunctivae normal.  Cardiovascular:     Rate and Rhythm: Normal rate and regular rhythm.     Heart sounds: Normal heart sounds. No murmur heard.   No friction rub. No gallop.  Pulmonary:     Effort: Pulmonary effort is normal. No respiratory distress.     Breath sounds: Normal breath sounds. No wheezing or rales.  Chest:      Chest wall: No tenderness.  Abdominal:     General: Bowel sounds are normal.     Palpations: Abdomen is soft.     Tenderness: There is no abdominal tenderness.  Musculoskeletal:     Cervical back: Neck supple.  Lymphadenopathy:     Cervical: No cervical adenopathy.  Skin:    General: Skin is warm and dry.     Findings: No rash.  Neurological:     Mental Status: He is alert and oriented to person, place, and time.  Psychiatric:        Behavior: Behavior normal.        Thought Content: Thought content normal.        Judgment: Judgment normal.     Depression screen [facility] 2/9 01/10/2021 09/10/2020 05/27/2020 04/04/2020 12/18/2016  Decreased Interest 0 0 0 0 0  Down, Depressed, Hopeless 1 0 0 0 0  PHQ - 2 Score 1 0 0 0 0       Assessment & Plan:    Patient Active Problem List   Diagnosis Date Noted   High risk homosexual behavior 07/18/2018   Healthcare maintenance 08/25/2016   History of syphilis 07/09/2016   Outbursts of anger 07/09/2016   HIV (human immunodeficiency virus infection) ([place]) 06/29/2016     Problem List Items Addressed This Visit       Other   HIV (human immunodeficiency virus infection) ([place]) - Primary (Chronic)    Mr. Steven [name] continues to have well-controlled virus with good adherence and tolerance to his ART regimen of Dovato.  No signs/symptoms of opportunistic infection.  We reviewed previous lab work and discussed plan of care.  Continue current dose of Dovato.  Check blood work today.  Plan for follow-up in 4 months or sooner if needed with lab work on the same day.      Relevant Medications   dolutegravir-lamiVUDine (DOVATO) 50-300 MG tablet   Other Relevant Orders   HIV-1 RNA quant-no reflex-bld   T-helper cells (CD4) count   Healthcare maintenance    Discussed importance of safe sexual practice and condom use.  Condoms offered. Influenza vaccine updated.  Declines other vaccines.      Other Visit Diagnoses     Screening for STDs (sexually  transmitted diseases)       Relevant Orders   RPR   Pharmacologic therapy       Relevant Orders   Lipid panel   Need for immunization against influenza       Relevant Orders   Flu Vaccine QUAD 61mo+IM (Fluarix, Fluzone & Alfiuria Quad PF) (Completed)        I have discontinued [place] Veno's clindamycin. I am also having him maintain his [place], and Dovato.   Meds ordered this encounter  Medications   dolutegravir-lamiVUDine (DOVATO) 50-300  MG tablet    Sig: Take 1 tablet by mouth daily.    Dispense:  30 tablet    Refill:  5    Order Specific Question:   Supervising Provider    Answer:   Carlyle Basques [[id number]]     Follow-up: Return in about 4 months (around 05/10/2021), or if symptoms worsen or fail to improve.   Terri Piedra, MSN, FNP-C Nurse Practitioner [facility] for [facility] [place] number: [phone]

## 2021-01-10 NOTE — Patient Instructions (Addendum)
Nice to see you.  We will check your lab work today.  Continue to take your medication daily as prescribed.  Refills have been sent to the pharmacy.  Plan for follow up in 4 months or sooner if needed with lab work on the same day.  Have a great day and stay safe!  

## 2021-01-10 NOTE — Assessment & Plan Note (Signed)
Steven [name] continues to have well-controlled virus with good adherence and tolerance to his ART regimen of Dovato.  No signs/symptoms of opportunistic infection.  We reviewed previous lab work and discussed plan of care.  Continue current dose of Dovato.  Check blood work today.  Plan for follow-up in 4 months or sooner if needed with lab work on the same day.

## 2021-01-13 ENCOUNTER — Telehealth: Payer: Self-pay

## 2021-01-13 NOTE — Telephone Encounter (Signed)
Called patient to relay results, no answer and voicemail box not set up.  Jalonda Antigua D Donique Hammonds, RN  

## 2021-01-13 NOTE — Telephone Encounter (Signed)
-----   Message from Veryl Speak, FNP sent at 01/13/2021  3:44 PM EST ----- Please inform Steven [name] that his viral load is undetectable and CD4 count is 775. RPR for syphilis is stable with no need for treatment.

## 2021-01-14 LAB — T-HELPER CELLS (CD4) COUNT (NOT AT ARMC)
Absolute CD4: 775 cells/uL (ref 490–1740)
CD4 T Helper %: 34 % (ref 30–61)
Total lymphocyte count: 2301 cells/uL (ref 850–3900)

## 2021-01-14 LAB — HIV-1 RNA QUANT-NO REFLEX-BLD
HIV 1 RNA Quant: NOT DETECTED Copies/mL
HIV-1 RNA Quant, Log: NOT DETECTED Log cps/mL

## 2021-01-14 LAB — RPR: RPR Ser Ql: REACTIVE — AB

## 2021-01-14 LAB — LIPID PANEL
Cholesterol: 164 mg/dL (ref <200)
HDL: 80 mg/dL (ref >=40)
LDL Cholesterol (Calc): 74 mg/dL (calc)
Non-HDL Cholesterol (Calc): 84 mg/dL (calc) (ref <130)
Total CHOL/HDL Ratio: 2.1 (calc) (ref <5.0)
Triglycerides: 36 mg/dL (ref <150)

## 2021-01-14 LAB — FLUORESCENT TREPONEMAL AB(FTA)-IGG-BLD: Fluorescent Treponemal ABS: REACTIVE — AB

## 2021-01-14 LAB — RPR TITER: RPR Titer: 1:1 {titer} — ABNORMAL HIGH

## 2021-01-14 NOTE — Telephone Encounter (Signed)
Second attempt to reach patient, no answer and voicemail box not set up. Second phone number on file is not in service.   Sandie Ano, RN

## 2021-01-20 ENCOUNTER — Emergency Department (HOSPITAL_COMMUNITY)
Admission: EM | Admit: 2021-01-20 | Discharge: 2021-01-20 | Discharge status: Against Medical Advice | Payer: Medicaid Other

## 2021-01-20 ENCOUNTER — Ambulatory Visit (HOSPITAL_COMMUNITY)
Admission: EM | Admit: 2021-01-20 | Discharge: 2021-01-20 | Disposition: A | Discharge status: Home / Self Care | Payer: Medicaid Other | Attending: Licensed Clinical Social Worker | Admitting: Licensed Clinical Social Worker

## 2021-01-20 ENCOUNTER — Other Ambulatory Visit (HOSPITAL_COMMUNITY): Payer: Self-pay

## 2021-01-20 DIAGNOSIS — Z21 Asymptomatic human immunodeficiency virus [HIV] infection status: Secondary | ICD-10-CM | POA: Insufficient documentation

## 2021-01-20 DIAGNOSIS — F419 Anxiety disorder, unspecified: Secondary | ICD-10-CM | POA: Insufficient documentation

## 2021-01-20 DIAGNOSIS — F331 Major depressive disorder, recurrent, moderate: Secondary | ICD-10-CM | POA: Insufficient documentation

## 2021-01-20 DIAGNOSIS — Z5902 Unsheltered homelessness: Secondary | ICD-10-CM | POA: Diagnosis not present

## 2021-01-20 DIAGNOSIS — R45851 Suicidal ideations: Secondary | ICD-10-CM | POA: Insufficient documentation

## 2021-01-20 DIAGNOSIS — Z560 Unemployment, unspecified: Secondary | ICD-10-CM | POA: Insufficient documentation

## 2021-01-20 NOTE — Discharge Instructions (Addendum)
Patient is instructed prior to discharge to: Take all medications as prescribed by his/her mental healthcare provider. Report any adverse effects and or reactions from the medicines to his/her outpatient provider promptly. Patient has been instructed & cautioned: To not engage in alcohol and or illegal drug use while on prescription medicines. In the event of worsening symptoms, patient is instructed to call the crisis hotline, 911 and or go to the nearest ED for appropriate evaluation and treatment of symptoms. To follow-up with his/her primary care provider for your other medical issues, concerns and or health care needs.  Please present to one of the following facilities to start medication management and therapy services:   [facility]  [address],  [place], [place] [address] [phone] Please note: Walk in hours are Mon-Fri @ 0745 am.    [facility]  [address],  , [place] [address] [phone] Please note: In order to start services, please present for walk in hours Mon-Fri at 0745 am.      
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
## 2021-01-20 NOTE — ED Notes (Signed)
Pt called for triage, no response x3
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
## 2021-01-20 NOTE — ED Notes (Signed)
Pt called for vitals, no answer 

## 2021-01-31 ENCOUNTER — Telehealth (HOSPITAL_COMMUNITY): Payer: Self-pay | Admitting: Physician Assistant

## 2021-01-31 NOTE — BH Assessment (Signed)
Care Management - [facility]UC Follow Up Discharges  ° °Writer attempted to make contact with patient today and was unsuccessful.  Writer left a HIPPA compliant voice message.  ° °Per chart review, patient was provided with outpatient resources. ° °

## 2021-02-12 ENCOUNTER — Other Ambulatory Visit (HOSPITAL_COMMUNITY): Payer: Self-pay

## 2021-02-14 ENCOUNTER — Other Ambulatory Visit (HOSPITAL_COMMUNITY): Payer: Self-pay

## 2021-03-15 ENCOUNTER — Other Ambulatory Visit (HOSPITAL_COMMUNITY): Payer: Self-pay

## 2021-03-17 ENCOUNTER — Other Ambulatory Visit (HOSPITAL_COMMUNITY): Payer: Self-pay

## 2021-03-25 ENCOUNTER — Emergency Department (HOSPITAL_COMMUNITY): Payer: Medicaid Other

## 2021-03-25 ENCOUNTER — Emergency Department (HOSPITAL_COMMUNITY)
Admission: EM | Admit: 2021-03-25 | Discharge: 2021-03-25 | Disposition: A | Discharge status: Home / Self Care | Payer: Medicaid Other | Attending: Emergency Medicine | Admitting: Emergency Medicine

## 2021-03-25 ENCOUNTER — Other Ambulatory Visit: Payer: Self-pay

## 2021-03-25 DIAGNOSIS — Z79899 Other long term (current) drug therapy: Secondary | ICD-10-CM | POA: Insufficient documentation

## 2021-03-25 DIAGNOSIS — M545 Low back pain, unspecified: Secondary | ICD-10-CM | POA: Diagnosis not present

## 2021-03-25 DIAGNOSIS — M25562 Pain in left knee: Secondary | ICD-10-CM | POA: Insufficient documentation

## 2021-03-25 MED ORDER — METHOCARBAMOL 500 MG PO TABS: 500.0000 mg | ORAL_TABLET | Freq: Two times a day (BID) | ORAL | 0 refills | Status: DC

## 2021-03-25 MED ORDER — LIDOCAINE 5 % EX PTCH: 1.0000 | MEDICATED_PATCH | CUTANEOUS | 0 refills | Status: DC
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
## 2021-03-25 NOTE — Discharge Instructions (Signed)
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
## 2021-03-27 ENCOUNTER — Other Ambulatory Visit (HOSPITAL_COMMUNITY): Payer: Self-pay

## 2021-03-28 ENCOUNTER — Other Ambulatory Visit (HOSPITAL_COMMUNITY): Payer: Self-pay

## 2021-03-31 ENCOUNTER — Other Ambulatory Visit (HOSPITAL_COMMUNITY): Payer: Self-pay

## 2021-04-01 ENCOUNTER — Other Ambulatory Visit (HOSPITAL_COMMUNITY): Payer: Self-pay

## 2021-04-08 ENCOUNTER — Other Ambulatory Visit (HOSPITAL_COMMUNITY): Payer: Self-pay

## 2021-04-15 ENCOUNTER — Other Ambulatory Visit (HOSPITAL_COMMUNITY): Payer: Self-pay

## 2021-04-22 ENCOUNTER — Other Ambulatory Visit (HOSPITAL_COMMUNITY): Payer: Self-pay

## 2021-05-15 ENCOUNTER — Other Ambulatory Visit (HOSPITAL_COMMUNITY): Payer: Self-pay

## 2021-05-22 ENCOUNTER — Other Ambulatory Visit: Payer: Self-pay

## 2021-05-22 ENCOUNTER — Other Ambulatory Visit (HOSPITAL_COMMUNITY): Payer: Self-pay

## 2021-05-22 ENCOUNTER — Other Ambulatory Visit (HOSPITAL_COMMUNITY)
Admission: RE | Admit: 2021-05-22 | Discharge: 2021-05-22 | Disposition: A | Discharge status: Home / Self Care | Payer: Medicaid Other | Source: Ambulatory Visit | Attending: Family | Admitting: Family

## 2021-05-22 ENCOUNTER — Ambulatory Visit (INDEPENDENT_AMBULATORY_CARE_PROVIDER_SITE_OTHER): Payer: Medicaid Other | Admitting: Family

## 2021-05-22 ENCOUNTER — Encounter: Payer: Self-pay | Admitting: Family

## 2021-05-22 VITALS — BP 130/79 | HR 95 | Temp 98.0°F | Wt 243.0 lb

## 2021-05-22 DIAGNOSIS — Z21 Asymptomatic human immunodeficiency virus [HIV] infection status: Secondary | ICD-10-CM | POA: Insufficient documentation

## 2021-05-22 DIAGNOSIS — Z0000 Encounter for general adult medical examination without abnormal findings: Secondary | ICD-10-CM

## 2021-05-22 DIAGNOSIS — Z599 Problem related to housing and economic circumstances, unspecified: Secondary | ICD-10-CM

## 2021-05-22 DIAGNOSIS — Z113 Encounter for screening for infections with a predominantly sexual mode of transmission: Secondary | ICD-10-CM

## 2021-05-22 DIAGNOSIS — F3341 Major depressive disorder, recurrent, in partial remission: Secondary | ICD-10-CM | POA: Diagnosis not present

## 2021-05-22 MED ORDER — QUETIAPINE FUMARATE 25 MG PO TABS
25.0000 mg | ORAL_TABLET | Freq: Every day | ORAL | 2 refills | Status: DC
  Filled 2021-05-22: qty 30, 30d supply, fill #0

## 2021-05-22 MED ORDER — DOVATO 50-300 MG PO TABS
1.0000 | ORAL_TABLET | Freq: Every day | ORAL | 5 refills | Status: DC
  Filled 2021-05-22: qty 30, 30d supply, fill #0
  Filled 2021-09-01: qty 30, 30d supply, fill #1

## 2021-05-22 MED ORDER — LAMOTRIGINE 25 MG PO TABS
25.0000 mg | ORAL_TABLET | Freq: Every day | ORAL | 2 refills | Status: DC
  Filled 2021-05-22: qty 30, 30d supply, fill #0

## 2021-05-22 NOTE — Patient Instructions (Signed)
Nice to see you.  We will check your lab work today.  Continue to take your medication daily as prescribed.  Refills have been sent to the pharmacy.  Plan for follow up in 1 months or sooner if needed with lab work on the same day.  Have a great day and stay safe!  
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
## 2021-05-23 ENCOUNTER — Telehealth: Payer: Self-pay

## 2021-05-23 DIAGNOSIS — Z599 Problem related to housing and economic circumstances, unspecified: Secondary | ICD-10-CM | POA: Insufficient documentation

## 2021-05-23 DIAGNOSIS — F32A Depression, unspecified: Secondary | ICD-10-CM | POA: Insufficient documentation

## 2021-05-23 LAB — T-HELPER CELL (CD4) - (RCID CLINIC ONLY)
CD4 % Helper T Cell: 33 % (ref 33–65)
CD4 T Cell Abs: 581 /uL (ref 400–1790)

## 2021-05-23 LAB — URINE CYTOLOGY ANCILLARY ONLY
Chlamydia: NEGATIVE
Comment: NEGATIVE
Comment: NORMAL
Neisseria Gonorrhea: NEGATIVE

## 2021-05-23 LAB — CYTOLOGY, (ORAL, ANAL, URETHRAL) ANCILLARY ONLY
Chlamydia: NEGATIVE
Chlamydia: POSITIVE — AB
Comment: NEGATIVE
Comment: NEGATIVE
Comment: NORMAL
Comment: NORMAL
Neisseria Gonorrhea: NEGATIVE
Neisseria Gonorrhea: NEGATIVE

## 2021-05-23 NOTE — Assessment & Plan Note (Signed)
   Discussed importance of safe sexual practice and condom use.  Condoms offered.  Declines vaccines. 

## 2021-05-23 NOTE — Assessment & Plan Note (Signed)
Mr. Boulet has had several life stressors resulting increased levels of depression and anxiety. No suicidal ideations or signs of psychosis. Previously maintained on seroquel and lamictal. Will restart medications for now. If adjustments needed may need to follow up with psychiatry.

## 2021-05-23 NOTE — Assessment & Plan Note (Signed)
Steven [name] continues to have well controlled virus with good adherence and tolerance to Dovato. Reviewed previous lab work and discussed plan of care. Check lab work today. Continue current dose of Dovato. Plan for follow up in 1 month or sooner if needed.

## 2021-05-23 NOTE — Assessment & Plan Note (Signed)
Mr. Storie has economic hardships with his monthly income and bills. Being assisted by case management to optimize bills and financial assistance. Discussed potential areas to help with hardship.

## 2021-05-23 NOTE — Telephone Encounter (Signed)
Patient called requesting lab results. Advised patient that some labs are still pending. Once reviewed by Steven [name] we will give him a call.    Steven [name] Steven [name], CMA

## 2021-05-25 LAB — FLUORESCENT TREPONEMAL AB(FTA)-IGG-BLD: Fluorescent Treponemal ABS: REACTIVE — AB

## 2021-05-25 LAB — RPR: RPR Ser Ql: REACTIVE — AB

## 2021-05-25 LAB — COMPREHENSIVE METABOLIC PANEL
AG Ratio: 2 (calc) (ref 1.0–2.5)
ALT: 26 U/L (ref 9–46)
AST: 17 U/L (ref 10–40)
Albumin: 4.5 g/dL (ref 3.6–5.1)
Alkaline phosphatase (APISO): 54 U/L (ref 36–130)
BUN: 12 mg/dL (ref 7–25)
CO2: 24 mmol/L (ref 20–32)
Calcium: 9.5 mg/dL (ref 8.6–10.3)
Chloride: 106 mmol/L (ref 98–110)
Creat: 0.86 mg/dL (ref 0.60–1.24)
Globulin: 2.2 g/dL (calc) (ref 1.9–3.7)
Glucose, Bld: 98 mg/dL (ref 65–99)
Potassium: 4 mmol/L (ref 3.5–5.3)
Sodium: 138 mmol/L (ref 135–146)
Total Bilirubin: 0.4 mg/dL (ref 0.2–1.2)
Total Protein: 6.7 g/dL (ref 6.1–8.1)

## 2021-05-25 LAB — RPR TITER: RPR Titer: 1:2 {titer} — ABNORMAL HIGH

## 2021-05-25 LAB — HIV-1 RNA QUANT-NO REFLEX-BLD
HIV 1 RNA Quant: <20 copies/mL — AB
HIV-1 RNA Quant, Log: <1.3 Log copies/mL — AB

## 2021-05-26 ENCOUNTER — Other Ambulatory Visit (HOSPITAL_COMMUNITY): Payer: Self-pay

## 2021-05-26 MED ORDER — DOXYCYCLINE MONOHYDRATE 100 MG PO CAPS
100.0000 mg | ORAL_CAPSULE | Freq: Two times a day (BID) | ORAL | 0 refills | Status: DC
  Filled 2021-05-26: qty 14, 7d supply, fill #0

## 2021-05-26 NOTE — Telephone Encounter (Signed)
Veryl Speak, FNP  P [facility] Triage Nurse Pool Please inform Marciano that his lab work looks shows his viral load is undetectable and CD4 count is 581. His rectal swab is positive for chlamydia and will send in 7 days of doxycycline to treat this. Otherwise kidney function, liver function and electrolytes are normal. RPR is stable with no treatment for syphilis needed.

## 2021-05-26 NOTE — Telephone Encounter (Signed)
Results sent in result note to Triage. Will need treatment with doxycyline for Chlamydia that I have sent to [facility].

## 2021-05-26 NOTE — Addendum Note (Signed)
Addended by: Mauricio Po D on: 05/26/2021 12:38 PM   Modules accepted: Orders

## 2021-05-26 NOTE — Telephone Encounter (Signed)
Left voicemail asking patient to return my call.   Karyl Sharrar P Syrenity Klepacki, CMA  

## 2021-05-26 NOTE — Telephone Encounter (Signed)
Spoke to patient and communicated results verbatim. Patient stated understanding that he needed to begin taking doxycycline; confirmed to patient that prescription was received by his pharmacy, and provided pharmacy phone number. Encouraged patient to notify partners so they can be tested and treated, and to abstain from sex for 7-10 days until treatment complete. Stated understanding and all questions answered.  Wyvonne Lenz, RN

## 2021-05-28 ENCOUNTER — Other Ambulatory Visit (HOSPITAL_COMMUNITY): Payer: Self-pay

## 2021-06-17 ENCOUNTER — Other Ambulatory Visit (HOSPITAL_COMMUNITY): Payer: Self-pay

## 2021-06-19 ENCOUNTER — Other Ambulatory Visit (HOSPITAL_COMMUNITY): Payer: Self-pay

## 2021-06-24 ENCOUNTER — Telehealth: Payer: Self-pay

## 2021-06-24 NOTE — Telephone Encounter (Signed)
Patient called to report he has been having chest pain off and on x 2 years but it seems to be getting worse. I advised patient Tammy Sours is gone for the day but I recommended that he go to ED to be evaluated if it seems that the pain is getting worse. Patient agreeable to go to ED to be evaluated.   Mana Morison Lesli Albee, CMA

## 2021-06-26 ENCOUNTER — Other Ambulatory Visit: Payer: Self-pay

## 2021-06-26 ENCOUNTER — Encounter: Payer: Self-pay | Admitting: Family

## 2021-06-26 ENCOUNTER — Encounter (INDEPENDENT_AMBULATORY_CARE_PROVIDER_SITE_OTHER): Payer: Medicaid Other | Admitting: Family

## 2021-06-27 ENCOUNTER — Ambulatory Visit: Payer: Medicaid Other | Admitting: Family

## 2021-06-27 ENCOUNTER — Other Ambulatory Visit (HOSPITAL_COMMUNITY): Payer: Self-pay

## 2021-07-16 ENCOUNTER — Other Ambulatory Visit: Payer: Self-pay

## 2021-07-16 ENCOUNTER — Ambulatory Visit (INDEPENDENT_AMBULATORY_CARE_PROVIDER_SITE_OTHER): Payer: Medicaid Other | Admitting: Family

## 2021-07-16 ENCOUNTER — Other Ambulatory Visit (HOSPITAL_COMMUNITY): Payer: Self-pay

## 2021-07-16 ENCOUNTER — Encounter: Payer: Self-pay | Admitting: Family

## 2021-07-16 DIAGNOSIS — F3341 Major depressive disorder, recurrent, in partial remission: Secondary | ICD-10-CM

## 2021-07-16 DIAGNOSIS — Z21 Asymptomatic human immunodeficiency virus [HIV] infection status: Secondary | ICD-10-CM

## 2021-07-16 MED ORDER — LAMOTRIGINE 25 MG PO TABS
50.0000 mg | ORAL_TABLET | Freq: Every day | ORAL | 1 refills | Status: DC
  Filled 2021-07-16: qty 60, 30d supply, fill #0
  Filled 2021-09-02: qty 60, 30d supply, fill #1

## 2021-07-16 MED ORDER — QUETIAPINE FUMARATE 50 MG PO TABS
50.0000 mg | ORAL_TABLET | Freq: Every day | ORAL | 1 refills | Status: DC
  Filled 2021-07-16: qty 30, 30d supply, fill #0
  Filled 2021-09-02: qty 30, 30d supply, fill #1

## 2021-07-16 NOTE — Progress Notes (Signed)
Patient ID: Steven [name], male    DOB: May 06, 1990, 31 y.o.   MRN: [id number]  Brief Narrative:  Steven [name] is a 31 y/o AA male with HIV disease diagnosed in June 2018. Genotype without significant resistance. Initial CD4 count was 280 and initial viral load of 55,300. No history of opportunistic infection. No previous treatment regimens prior to Steven [name], Steven [name].   Subjective:    Chief Complaint  Patient presents with   Follow-up    Increased weight 60?/ interested in cab/     Virtual Visit via Telephone/Video Note   I connected with Steven [name] on 07/16/2021 at 5:13 PM by telephone and verified that I am speaking with the correct person using two identifiers.   I discussed the limitations, risks, security and privacy concerns of performing an evaluation and management service by telephone and the availability of in person appointments. I also discussed with the patient that there may be a patient responsible charge related to this service. The patient expressed understanding and agreed to proceed.  Location:  Patient: Home in [place], [place] Provider: [facility]   HPI:  Steven [name] is a 31 y.o. male with HIV disease last seen on 05/22/2021 with well-controlled virus and good adherence and tolerance to his ART regimen of Dovato viral load was undetectable with CD4 count of 581.  Kidney function, liver function, electrolytes within normal ranges.  RPR titer 1: 2.  This remains down from treatment level 1: 8.  Also had increased levels of depression and was restarted on Seroquel and Lamictal.  Telehealth visit today for routine follow-up.  Steven [name] has been taking his medications daily as prescribed with no adverse side effects.  He feels his mood is improved since starting the Lamictal and Seroquel although there is some room for improvement.  He continues to remain stressed and irritable as he has family living with him currently.  Denies suicidal ideations but does  feel down, depressed at times.  Does not currently have transportation. Denies fevers, chills, night sweats, headaches, changes in vision, neck pain/stiffness, nausea, diarrhea, vomiting, lesions or rashes.  He is also concerned about his weight gain since his last office visit and is interested in long acting injectable medications.   No Known Allergies    Outpatient Medications Prior to Visit  Medication Sig Dispense Refill   dolutegravir-lamiVUDine (DOVATO) 50-300 MG tablet Take 1 tablet by mouth daily. 30 tablet 5   lamoTRIgine (LAMICTAL) 25 MG tablet Take 1 tablet (25 mg total) by mouth daily. 30 tablet 2   QUEtiapine (SEROQUEL) 25 MG tablet Take 1 tablet (25 mg total) by mouth at bedtime. 30 tablet 2   doxycycline (MONODOX) 100 MG capsule Take 1 capsule (100 mg total) by mouth 2 (two) times daily. (Patient not taking: Reported on 07/16/2021) 14 capsule 0   lidocaine (LIDODERM) 5 % Place 1 patch onto the skin daily. Remove & Discard patch within 12 hours or as directed by MD (Patient not taking: Reported on 06/26/2021) 30 patch 0   methocarbamol (ROBAXIN) 500 MG tablet Take 1 tablet (500 mg total) by mouth 2 (two) times daily. (Patient not taking: Reported on 06/26/2021) 20 tablet 0   OVER THE COUNTER MEDICATION Take 1 tablet by mouth daily. Brain awake (Patient not taking: Reported on 06/26/2021)     OVER THE COUNTER MEDICATION Take 2 tablets by mouth daily. Vitamin for mental health and nervous system (Patient not taking: Reported on 06/26/2021)     No facility-administered  medications prior to visit.     Past Medical History:  Diagnosis Date   Depression 07/09/2016   History of kidney stones    History of syphilis 07/09/2016   HIV (human immunodeficiency virus infection) (HCC) 06/29/2016   Dx 06/10/2016   Kidney stone    kidney stones     Past Surgical History:  Procedure Laterality Date   FACIAL LACERATION REPAIR N/A 11/16/2020   Procedure: REPAIR OF COMPLEX  LIP LACERATION;   Surgeon: Laren Boom, DO;  Location: [facility];  Service: ENT;  Laterality: N/A;   FINGER SURGERY Left    ring finger   NOSE SURGERY         Review of Systems  Constitutional:  Negative for appetite change, chills, fatigue, fever and unexpected weight change.  Eyes:  Negative for visual disturbance.  Respiratory:  Negative for cough, chest tightness, shortness of breath and wheezing.   Cardiovascular:  Negative for chest pain and leg swelling.  Gastrointestinal:  Negative for abdominal pain, constipation, diarrhea, nausea and vomiting.  Genitourinary:  Negative for dysuria, flank pain, frequency, genital sores, hematuria and urgency.  Skin:  Negative for rash.  Allergic/Immunologic: Negative for immunocompromised state.  Neurological:  Negative for dizziness and headaches.  Psychiatric/Behavioral:  Positive for agitation, behavioral problems and dysphoric mood. Negative for confusion, hallucinations, self-injury, sleep disturbance and suicidal ideas. The patient is not nervous/anxious.       Objective:    Nursing note and vital signs reviewed.  Steven [name] mood is labile during our conversation having outbursts of anger at times to people in the background. Expresses frustration at times in addition to his weight gain. Though process sounds borderline manic.     Assessment & Plan:   Problem List Items Addressed This Visit       Other   HIV (human immunodeficiency virus infection) (HCC) (Chronic)    Steven [name] continues to have well-controlled virus with good adherence and tolerance to his ART regimen of Dovato.  Reviewed previous lab work and discussed plan of care.  Continue current dose of Dovato.  Once mood is stabilized we will discuss possible change to long-acting medications however this may worsen his depression so we will take that into consideration.  The weight gain he is experiencing is likely compounded by his Lamictal and Seroquel.  Plan for follow-up in 1 month  or sooner if needed.      Depression    Steven [name] has improved symptoms of depression and question underlying bipolar disorder or other psychiatric condition contributing to his outbursts of anger and mood.  Tolerating Lamictal and Seroquel and asking to increase medication.  We will increase both medications to 50 mg and continue to monitor change.  Recommend follow-up with psychiatry for additional medication changes and mood stabilization.  No suicidal ideations or signs of psychosis.  Continue to monitor.        I have discontinued Steven [name]'s QUEtiapine. I have also changed his lamoTRIgine. Additionally, I am having him start on QUEtiapine. Lastly, I am having him maintain his OVER THE COUNTER MEDICATION, OVER THE COUNTER MEDICATION, lidocaine, methocarbamol, Dovato, and doxycycline.   Meds ordered this encounter  Medications   lamoTRIgine (LAMICTAL) 25 MG tablet    Sig: Take 2 tablets (50 mg total) by mouth daily.    Dispense:  60 tablet    Refill:  1    Order Specific Question:   Supervising Provider    Answer:   Judyann Munson [phone]  QUEtiapine (SEROQUEL) 50 MG tablet    Sig: Take 1 tablet (50 mg total) by mouth at bedtime.    Dispense:  30 tablet    Refill:  1    Please mail    Order Specific Question:   Supervising Provider    Answer:   Judyann Munson [phone]     I discussed the assessment and treatment plan with the patient. The patient was provided an opportunity to ask questions and all were answered. The patient agreed with the plan and demonstrated an understanding of the instructions.   The patient was advised to call back or seek an in-person evaluation if the symptoms worsen or if the condition fails to improve as anticipated.   I provided 12   minutes of non-face-to-face time during this encounter.  Follow-up: Return in about 1 month (around 08/16/2021), or if symptoms worsen or fail to improve.   Steven Eke, MSN, FNP-C Nurse  Practitioner [facility] Main number: [phone]

## 2021-07-16 NOTE — Patient Instructions (Signed)
Nice to see you.  Continue to take your medication daily as prescribed.  We have increased the doses of your medication.   Refills have been sent to the pharmacy.  Plan for follow up in 1 months or sooner if needed with lab work on the same day.  Have a great day and stay safe!

## 2021-07-16 NOTE — Assessment & Plan Note (Signed)
Steven [name] continues to have well-controlled virus with good adherence and tolerance to his ART regimen of Dovato.  Reviewed previous lab work and discussed plan of care.  Continue current dose of Dovato.  Once mood is stabilized we will discuss possible change to long-acting medications however this may worsen his depression so we will take that into consideration.  The weight gain he is experiencing is likely compounded by his Lamictal and Seroquel.  Plan for follow-up in 1 month or sooner if needed.

## 2021-07-16 NOTE — Assessment & Plan Note (Signed)
Steven [name] has improved symptoms of depression and question underlying bipolar disorder or other psychiatric condition contributing to his outbursts of anger and mood.  Tolerating Lamictal and Seroquel and asking to increase medication.  We will increase both medications to 50 mg and continue to monitor change.  Recommend follow-up with psychiatry for additional medication changes and mood stabilization.  No suicidal ideations or signs of psychosis.  Continue to monitor.

## 2021-08-24 ENCOUNTER — Encounter (HOSPITAL_COMMUNITY): Payer: Self-pay | Admitting: Emergency Medicine

## 2021-08-24 ENCOUNTER — Emergency Department (HOSPITAL_COMMUNITY)
Admission: EM | Admit: 2021-08-24 | Discharge: 2021-08-24 | Disposition: A | Discharge status: Home / Self Care | Payer: Medicaid Other

## 2021-08-24 ENCOUNTER — Ambulatory Visit (HOSPITAL_COMMUNITY)
Admission: EM | Admit: 2021-08-24 | Discharge: 2021-08-24 | Disposition: A | Discharge status: Home / Self Care | Payer: Medicaid Other | Attending: Internal Medicine | Admitting: Internal Medicine

## 2021-08-24 DIAGNOSIS — J029 Acute pharyngitis, unspecified: Secondary | ICD-10-CM

## 2021-08-24 DIAGNOSIS — J069 Acute upper respiratory infection, unspecified: Secondary | ICD-10-CM

## 2021-08-24 DIAGNOSIS — Z113 Encounter for screening for infections with a predominantly sexual mode of transmission: Secondary | ICD-10-CM

## 2021-08-24 LAB — POCT RAPID STREP A, ED / UC: Streptococcus, Group A Screen (Direct): NEGATIVE

## 2021-08-24 MED ORDER — PSEUDOEPHEDRINE HCL 30 MG PO TABS: 30.0000 mg | ORAL_TABLET | Freq: Three times a day (TID) | ORAL | 0 refills | Status: DC | PRN

## 2021-08-24 MED ORDER — FLUTICASONE PROPIONATE 50 MCG/ACT NA SUSP: 2.0000 | Freq: Every day | NASAL | 2 refills | Status: DC

## 2021-08-24 MED ORDER — IBUPROFEN 600 MG PO TABS: 600.0000 mg | ORAL_TABLET | Freq: Three times a day (TID) | ORAL | 0 refills | Status: DC

## 2021-08-24 NOTE — ED Triage Notes (Signed)
Pt reports sore throat and dental pain for 2 weeks.  Pt reports that symptoms started after sniffing flower and drinking with friends and drank out of someone else's cup.

## 2021-08-24 NOTE — ED Notes (Signed)
Pt left to go to UC during triage process.

## 2021-08-24 NOTE — Discharge Instructions (Addendum)
Lab tests will be completed and 48 hours.  If you do not get a call from this office that indicates that labs are negative.  You can go on MyChart and look at the lab results when they post in 24 to 48 hours. Advise use Flonase nasal spray 2 sprays each nostril once a day to help decrease the sinus congestion. Advised to use Sudafed 30 mg 1 every 8 hours to help with the sinus and ear congestion. Advised use ibuprofen 600 mg 1 every 8 hours with food to help decrease the pain. Advised to follow-up PCP or return to urgent care if symptoms fail to improve.

## 2021-08-24 NOTE — ED Provider Notes (Signed)
[facility]    CSN: [id number] Arrival date & time: 08/24/21  1600      History   Chief Complaint Chief Complaint  Patient presents with   Sore Throat   Dental Pain    HPI Steven [name] is a 31 y.o. male.   31 year old male presents with sore throat.  Patient indicates that 2 weeks ago he was with friends and they were sharing a drink, shortly after that he started getting some upper respiratory congestion, sore throat with painful swallowing.  Patient indicates he has had some upper sinus congestion with right ear pressure and discomfort.  He denies any fever but he has had some mild chills and body aches.  He relates that he has been having some mild rhinitis and coughing up some sputum and both of these are clear to yellow. Patient is concerned about his sore throat as he has performed oral sex on his partners and is concerned about a possible STI.   Sore Throat  Dental Pain   Past Medical History:  Diagnosis Date   Depression 07/09/2016   History of kidney stones    History of syphilis 07/09/2016   HIV (human immunodeficiency virus infection) (HCC) 06/29/2016   Dx 06/10/2016   Kidney stone    kidney stones    Patient Active Problem List   Diagnosis Date Noted   Depression 05/23/2021   Economic hardship 05/23/2021   High risk homosexual behavior 07/18/2018   Healthcare maintenance 08/25/2016   History of syphilis 07/09/2016   Outbursts of anger 07/09/2016   HIV (human immunodeficiency virus infection) (HCC) 06/29/2016    Past Surgical History:  Procedure Laterality Date   FACIAL LACERATION REPAIR N/A 11/16/2020   Procedure: REPAIR OF COMPLEX  LIP LACERATION;  Surgeon: Laren Boom, DO;  Location: [facility];  Service: ENT;  Laterality: N/A;   FINGER SURGERY Left    ring finger   NOSE SURGERY         Home Medications    Prior to Admission medications   Medication Sig Start Date End Date Taking? Authorizing Provider  fluticasone  (FLONASE) 50 MCG/ACT nasal spray Place 2 sprays into both nostrils daily. 08/24/21  Yes Ellsworth Lennox, PA-C  ibuprofen (ADVIL) 600 MG tablet Take 1 tablet (600 mg total) by mouth 3 (three) times daily. 08/24/21  Yes Ellsworth Lennox, PA-C  pseudoephedrine (SUDAFED) 30 MG tablet Take 1 tablet (30 mg total) by mouth every 8 (eight) hours as needed for congestion. 08/24/21  Yes Ellsworth Lennox, PA-C  dolutegravir-lamiVUDine (DOVATO) 50-300 MG tablet Take 1 tablet by mouth daily. 05/22/21   Veryl Speak, FNP  doxycycline (MONODOX) 100 MG capsule Take 1 capsule (100 mg total) by mouth 2 (two) times daily. Patient not taking: Reported on 07/16/2021 05/26/21   Veryl Speak, FNP  lamoTRIgine (LAMICTAL) 25 MG tablet Take 2 tablets (50 mg total) by mouth daily. 07/16/21   Veryl Speak, FNP  lidocaine (LIDODERM) 5 % Place 1 patch onto the skin daily. Remove & Discard patch within 12 hours or as directed by MD Patient not taking: Reported on 06/26/2021 03/25/21   Henderly, Britni A, PA-C  methocarbamol (ROBAXIN) 500 MG tablet Take 1 tablet (500 mg total) by mouth 2 (two) times daily. Patient not taking: Reported on 06/26/2021 03/25/21   Henderly, Britni A, PA-C  OVER THE COUNTER MEDICATION Take 1 tablet by mouth daily. Brain awake Patient not taking: Reported on 06/26/2021    [provider]  OVER  THE COUNTER MEDICATION Take 2 tablets by mouth daily. Vitamin for mental health and nervous system Patient not taking: Reported on 06/26/2021    [provider]  QUEtiapine (SEROQUEL) 50 MG tablet Take 1 tablet (50 mg total) by mouth at bedtime. 07/16/21   Veryl Speak, FNP    Family History Family History  Problem Relation Age of Onset   Mental illness Mother     Social History Social History   Tobacco Use   Smoking status: Former    Types: Cigars   Smokeless tobacco: Never  Vaping Use   Vaping Use: Never used  Substance Use Topics   Alcohol use: Not Currently    Comment: socially    Drug use: Yes    Types: Marijuana     Allergies   Patient has no known allergies.   Review of Systems Review of Systems  HENT:  Positive for rhinorrhea and sinus pressure.      Physical Exam Triage Vital Signs ED Triage Vitals  Enc Vitals Group     BP 08/24/21 1643 (!) 129/92     Pulse Rate 08/24/21 1643 80     Resp 08/24/21 1643 17     Temp 08/24/21 1643 98.5 F (36.9 C)     Temp Source 08/24/21 1643 Oral     SpO2 08/24/21 1643 95 %     Weight --      Height --      Head Circumference --      Peak Flow --      Pain Score 08/24/21 1642 10     Pain Loc --      Pain Edu? --      Excl. in GC? --    No data found.  Updated Vital Signs BP (!) 129/92 (BP Location: Right Arm)   Pulse 80   Temp 98.5 F (36.9 C) (Oral)   Resp 17   SpO2 95%   Visual Acuity Right Eye Distance:   Left Eye Distance:   Bilateral Distance:    Right Eye Near:   Left Eye Near:    Bilateral Near:     Physical Exam Constitutional:      Appearance: He is well-developed.  HENT:     Right Ear: Ear canal normal. Tympanic membrane is injected.     Left Ear: Ear canal normal. Tympanic membrane is injected.     Mouth/Throat:     Mouth: Mucous membranes are moist.     Pharynx: Pharyngeal swelling and posterior oropharyngeal erythema present. No oropharyngeal exudate.  Cardiovascular:     Rate and Rhythm: Normal rate and regular rhythm.     Heart sounds: Normal heart sounds.  Pulmonary:     Effort: Pulmonary effort is normal.     Breath sounds: Normal breath sounds and air entry. No wheezing, rhonchi or rales.  Lymphadenopathy:     Cervical: No cervical adenopathy.  Neurological:     Mental Status: He is alert.      UC Treatments / Results  Labs (all labs ordered are listed, but only abnormal results are displayed) Labs Reviewed  CULTURE, GROUP A STREP [facility])  POCT RAPID STREP A, ED / UC  CYTOLOGY, (ORAL, ANAL, URETHRAL) ANCILLARY ONLY    EKG   Radiology No results  found.  Procedures Procedures (including critical care time)  Medications Ordered in UC Medications - No data to display  Initial Impression / Assessment and Plan / UC Course  I have reviewed the triage vital  signs and the nursing notes.  Pertinent labs & imaging results that were available during my care of the patient were reviewed by me and considered in my medical decision making (see chart for details).    Plan: 1.  Advised to use the Flonase nasal spray 2 sprays each nostril once a day to help decrease the nasal congestion. 2.  Advised to take Sudafed 30 mg 1 every 8 hours to help decrease sinus congestion. 3.  Advised to take ibuprofen 600 mg 1 every 8 hours to help decrease pain or fever. 4.  Advised to follow-up PCP or return to urgent care if symptoms fail to improve. 5.  Throat culture is pending Final Clinical Impressions(s) / UC Diagnoses   Final diagnoses:  Sore throat  Routine screening for STI (sexually transmitted infection)  Viral upper respiratory tract infection     Discharge Instructions      Lab tests will be completed and 48 hours.  If you do not get a call from this office that indicates that labs are negative.  You can go on MyChart and look at the lab results when they post in 24 to 48 hours. Advise use Flonase nasal spray 2 sprays each nostril once a day to help decrease the sinus congestion. Advised to use Sudafed 30 mg 1 every 8 hours to help with the sinus and ear congestion. Advised use ibuprofen 600 mg 1 every 8 hours with food to help decrease the pain. Advised to follow-up PCP or return to urgent care if symptoms fail to improve.     ED Prescriptions     Medication Sig Dispense Auth. Provider   fluticasone (FLONASE) 50 MCG/ACT nasal spray Place 2 sprays into both nostrils daily. 11.1 mL Nyoka Lint, PA-C   pseudoephedrine (SUDAFED) 30 MG tablet Take 1 tablet (30 mg total) by mouth every 8 (eight) hours as needed for congestion. 30 tablet  Nyoka Lint, PA-C   ibuprofen (ADVIL) 600 MG tablet Take 1 tablet (600 mg total) by mouth 3 (three) times daily. 30 tablet Nyoka Lint, PA-C      PDMP not reviewed this encounter.   Nyoka Lint, PA-C 08/24/21 1725

## 2021-08-24 NOTE — ED Triage Notes (Signed)
Pt here via [facility] from home for shob since last night and has been getting worse today. Pt reports chest tightness. VSS, 98% RA, 136/84, 84HR

## 2021-08-25 LAB — CYTOLOGY, (ORAL, ANAL, URETHRAL) ANCILLARY ONLY
Chlamydia: NEGATIVE
Comment: NEGATIVE
Comment: NEGATIVE
Comment: NORMAL
Neisseria Gonorrhea: NEGATIVE
Trichomonas: NEGATIVE

## 2021-08-27 ENCOUNTER — Ambulatory Visit (INDEPENDENT_AMBULATORY_CARE_PROVIDER_SITE_OTHER): Payer: Medicaid Other | Admitting: Primary Care

## 2021-08-27 LAB — CULTURE, GROUP A STREP (THRC)

## 2021-08-28 ENCOUNTER — Ambulatory Visit (INDEPENDENT_AMBULATORY_CARE_PROVIDER_SITE_OTHER): Payer: Medicaid Other | Admitting: Primary Care

## 2021-09-01 ENCOUNTER — Other Ambulatory Visit (HOSPITAL_COMMUNITY): Payer: Self-pay

## 2021-09-02 ENCOUNTER — Other Ambulatory Visit (HOSPITAL_COMMUNITY): Payer: Self-pay

## 2021-09-03 ENCOUNTER — Other Ambulatory Visit (HOSPITAL_COMMUNITY): Payer: Self-pay

## 2021-09-17 ENCOUNTER — Encounter: Payer: Self-pay | Admitting: Pharmacist

## 2021-10-20 ENCOUNTER — Telehealth: Payer: Self-pay

## 2021-10-20 NOTE — Telephone Encounter (Signed)
Patient requesting a medication for sinus congestion, cough, and sneezing x 3 days. Patient has been taking OTC cold and sinus medications with no relief. I asked if he has tested for COVID, he hasn't. Please advise.    [place], CMA

## 2021-10-24 NOTE — Progress Notes (Deleted)
Brief Narrative   Patient ID: Steven [name], male    DOB: 1990/01/27, 31 y.o.   MRN: [id number]    Subjective:    No chief complaint on file.   HPI:  Steven [name] is a 31 y.o. male with HIV disease last seen through telehealth visit on 07/16/2021 with well-controlled virus with good adherence and tolerance to Dovato.  Viral load was undetectable and CD4 count 581.  STD testing negative.  Depression was overall improved with good tolerance to Lamictal and Seroquel.  Both medications increased with recommendation for psychiatry follow-up.  Here today for follow-up.    No Known Allergies    Outpatient Medications Prior to Visit  Medication Sig Dispense Refill   dolutegravir-lamiVUDine (DOVATO) 50-300 MG tablet Take 1 tablet by mouth daily. 30 tablet 5   doxycycline (MONODOX) 100 MG capsule Take 1 capsule (100 mg total) by mouth 2 (two) times daily. (Patient not taking: Reported on 07/16/2021) 14 capsule 0   fluticasone (FLONASE) 50 MCG/ACT nasal spray Place 2 sprays into both nostrils daily. 11.1 mL 2   ibuprofen (ADVIL) 600 MG tablet Take 1 tablet (600 mg total) by mouth 3 (three) times daily. 30 tablet 0   lamoTRIgine (LAMICTAL) 25 MG tablet Take 2 tablets (50 mg total) by mouth daily. 60 tablet 1   lidocaine (LIDODERM) 5 % Place 1 patch onto the skin daily. Remove & Discard patch within 12 hours or as directed by MD (Patient not taking: Reported on 06/26/2021) 30 patch 0   methocarbamol (ROBAXIN) 500 MG tablet Take 1 tablet (500 mg total) by mouth 2 (two) times daily. (Patient not taking: Reported on 06/26/2021) 20 tablet 0   OVER THE COUNTER MEDICATION Take 1 tablet by mouth daily. Brain awake (Patient not taking: Reported on 06/26/2021)     OVER THE COUNTER MEDICATION Take 2 tablets by mouth daily. Vitamin for mental health and nervous system (Patient not taking: Reported on 06/26/2021)     pseudoephedrine (SUDAFED) 30 MG tablet Take 1 tablet (30 mg total) by mouth every 8 (eight)  hours as needed for congestion. 30 tablet 0   QUEtiapine (SEROQUEL) 50 MG tablet Take 1 tablet (50 mg total) by mouth at bedtime. 30 tablet 1   No facility-administered medications prior to visit.     Past Medical History:  Diagnosis Date   Depression 07/09/2016   History of kidney stones    History of syphilis 07/09/2016   HIV (human immunodeficiency virus infection) ([place]) 06/29/2016   Dx 06/10/2016   Kidney stone    kidney stones     Past Surgical History:  Procedure Laterality Date   FACIAL LACERATION REPAIR N/A 11/16/2020   Procedure: REPAIR OF COMPLEX  LIP LACERATION;  Surgeon: Jason Coop, DO;  Location: [place];  Service: ENT;  Laterality: N/A;   FINGER SURGERY Left    ring finger   NOSE SURGERY        Review of Systems  Constitutional:  Negative for appetite change, chills, fatigue, fever and unexpected weight change.  Eyes:  Negative for visual disturbance.  Respiratory:  Negative for cough, chest tightness, shortness of breath and wheezing.   Cardiovascular:  Negative for chest pain and leg swelling.  Gastrointestinal:  Negative for abdominal pain, constipation, diarrhea, nausea and vomiting.  Genitourinary:  Negative for dysuria, flank pain, frequency, genital sores, hematuria and urgency.  Skin:  Negative for rash.  Allergic/Immunologic: Negative for immunocompromised state.  Neurological:  Negative for dizziness and headaches.  Objective:    There were no vitals taken for this visit. Nursing note and vital signs reviewed.  Physical Exam Constitutional:      General: He is not in acute distress.    Appearance: He is well-developed.  Eyes:     Conjunctiva/sclera: Conjunctivae normal.  Cardiovascular:     Rate and Rhythm: Normal rate and regular rhythm.     Heart sounds: Normal heart sounds. No murmur heard.    No friction rub. No gallop.  Pulmonary:     Effort: Pulmonary effort is normal. No respiratory distress.     Breath sounds: Normal  breath sounds. No wheezing or rales.  Chest:     Chest wall: No tenderness.  Abdominal:     General: Bowel sounds are normal.     Palpations: Abdomen is soft.     Tenderness: There is no abdominal tenderness.  Musculoskeletal:     Cervical back: Neck supple.  Lymphadenopathy:     Cervical: No cervical adenopathy.  Skin:    General: Skin is warm and dry.     Findings: No rash.  Neurological:     Mental Status: He is alert and oriented to person, place, and time.  Psychiatric:        Behavior: Behavior normal.        Thought Content: Thought content normal.        Judgment: Judgment normal.         05/22/2021    1:37 PM 01/20/2021    6:07 PM 01/10/2021   11:52 AM 09/10/2020    3:03 PM 05/27/2020   10:34 AM  Depression screen PHQ 2/9  Decreased Interest 3  0 0 0  Down, Depressed, Hopeless 3  1 0 0  PHQ - 2 Score 6  1 0 0  Altered sleeping       Tired, decreased energy       Change in appetite       Feeling bad or failure about yourself        Trouble concentrating       Moving slowly or fidgety/restless       Suicidal thoughts       PHQ-9 Score       Difficult doing work/chores          Information is confidential and restricted. Go to Review Flowsheets to unlock data.       Assessment & Plan:    Patient Active Problem List   Diagnosis Date Noted   Depression 05/23/2021   Economic hardship 05/23/2021   High risk homosexual behavior 07/18/2018   Healthcare maintenance 08/25/2016   History of syphilis 07/09/2016   Outbursts of anger 07/09/2016   HIV (human immunodeficiency virus infection) (H[facility]) 06/29/2016     Problem List Items Addressed This Visit   None    I am having [name] maintain his OVER THE COUNTER MEDICATION, OVER THE COUNTER MEDICATION, lidocaine, methocarbamol, Dovato, doxycycline, lamoTRIgine, QUEtiapine, fluticasone, pseudoephedrine, and ibuprofen.   No orders of the defined types were placed in this encounter.    Follow-up: No  follow-ups on file.   [name], MSN, FNP-C Nurse Practitioner [facility] Main number: [phone]

## 2021-10-29 ENCOUNTER — Other Ambulatory Visit (HOSPITAL_COMMUNITY): Payer: Self-pay

## 2021-10-30 ENCOUNTER — Other Ambulatory Visit (HOSPITAL_COMMUNITY): Payer: Self-pay

## 2021-10-30 ENCOUNTER — Telehealth: Payer: Self-pay

## 2021-10-30 NOTE — Telephone Encounter (Signed)
Spoke to [place] getting medication filled for patient. Patient aware and given number to call to set up automatic refills though [facility] pharmacy.   [place], CMA

## 2021-10-30 NOTE — Telephone Encounter (Signed)
Pt called stating he was out of Dovato. I informed pt there were refills at the listed pharmacy but he said the pharmacy was giving him trouble. Pt has requested a call back from [place]. Pt can be reached at [phone]

## 2021-11-03 ENCOUNTER — Other Ambulatory Visit: Payer: Self-pay

## 2021-11-03 ENCOUNTER — Other Ambulatory Visit (HOSPITAL_COMMUNITY): Payer: Self-pay

## 2021-11-03 ENCOUNTER — Ambulatory Visit: Payer: Medicaid Other | Admitting: Family

## 2021-11-03 ENCOUNTER — Telehealth: Payer: Self-pay

## 2021-11-03 DIAGNOSIS — Z21 Asymptomatic human immunodeficiency virus [HIV] infection status: Secondary | ICD-10-CM

## 2021-11-03 MED ORDER — DOVATO 50-300 MG PO TABS
1.0000 | ORAL_TABLET | Freq: Every day | ORAL | 0 refills | Status: DC
  Filled 2021-11-03 – 2021-11-04 (×2): qty 30, 30d supply, fill #0

## 2021-11-03 NOTE — Telephone Encounter (Signed)
Called patient to reschedule missed appointment. Spoke with individual, but call was dropped right after I introduced myself. Will send MyChart message.  Binnie Kand, RN

## 2021-11-04 ENCOUNTER — Other Ambulatory Visit (HOSPITAL_COMMUNITY): Payer: Self-pay

## 2021-11-05 ENCOUNTER — Other Ambulatory Visit (HOSPITAL_COMMUNITY): Payer: Self-pay

## 2021-11-06 ENCOUNTER — Ambulatory Visit: Payer: Medicaid Other | Admitting: Family

## 2021-11-11 ENCOUNTER — Ambulatory Visit (HOSPITAL_COMMUNITY)
Admission: EM | Admit: 2021-11-11 | Discharge: 2021-11-11 | Disposition: A | Discharge status: Home / Self Care | Payer: Medicaid Other | Attending: Family Medicine | Admitting: Family Medicine

## 2021-11-11 ENCOUNTER — Encounter (HOSPITAL_COMMUNITY): Payer: Self-pay

## 2021-11-11 DIAGNOSIS — M5442 Lumbago with sciatica, left side: Secondary | ICD-10-CM

## 2021-11-11 DIAGNOSIS — G8929 Other chronic pain: Secondary | ICD-10-CM

## 2021-11-11 MED ORDER — KETOROLAC TROMETHAMINE 30 MG/ML IJ SOLN
30.0000 mg | Freq: Once | INTRAMUSCULAR | Status: AC
  Administered 2021-11-11: 30 mg via INTRAMUSCULAR

## 2021-11-11 MED ORDER — IBUPROFEN 800 MG PO TABS: 800.0000 mg | ORAL_TABLET | Freq: Three times a day (TID) | ORAL | 0 refills | Status: DC | PRN

## 2021-11-11 MED ORDER — TIZANIDINE HCL 4 MG PO TABS: 4.0000 mg | ORAL_TABLET | Freq: Three times a day (TID) | ORAL | 0 refills | Status: DC | PRN

## 2021-11-11 MED ORDER — KETOROLAC TROMETHAMINE 30 MG/ML IJ SOLN
INTRAMUSCULAR | Status: AC
  Filled 2021-11-11: qty 1

## 2021-11-11 NOTE — ED Notes (Signed)
Discharged by Leisha, CMA.  

## 2021-11-11 NOTE — ED Provider Notes (Signed)
[facility]    CSN: [id number] Arrival date & time: 11/11/21  0948      History   Chief Complaint Chief Complaint  Patient presents with   Back Pain    HPI Steven [name] is a 31 y.o. male.    Back Pain  Here for left low back pain that radiates into his left lower leg.  This has been going on for about 2 months at this time.  He states he has had trouble with left sciatica for many many years.  No bowel or bladder incontinence.  No muscle weakness of the lower extremities.  He has had a little tingling in his toes on the left foot. 400 mg of ibuprofen can help, but then the pain comes back in 6 to 8 hours  I can see some x-rays from 2021 that show some degenerative changes in his lumbar spine. He has not had any recent trauma or fever.  Upper respiratory infection that has improved.  He is on treatment for HIV   Past Medical History:  Diagnosis Date   Depression 07/09/2016   History of kidney stones    History of syphilis 07/09/2016   HIV (human immunodeficiency virus infection) (HCC) 06/29/2016   Dx 06/10/2016   Kidney stone    kidney stones    Patient Active Problem List   Diagnosis Date Noted   Depression 05/23/2021   Economic hardship 05/23/2021   High risk homosexual behavior 07/18/2018   Healthcare maintenance 08/25/2016   History of syphilis 07/09/2016   Outbursts of anger 07/09/2016   HIV (human immunodeficiency virus infection) (HCC) 06/29/2016    Past Surgical History:  Procedure Laterality Date   FACIAL LACERATION REPAIR N/A 11/16/2020   Procedure: REPAIR OF COMPLEX  LIP LACERATION;  Surgeon: Laren Boom, DO;  Location[facility];  Service: ENT;  Laterality: N/A;   FINGER SURGERY Left    ring finger   NOSE SURGERY         Home Medications    Prior to Admission medications   Medication Sig Start Date End Date Taking? Authorizing Provider  dolutegravir-lamiVUDine (DOVATO) 50-300 MG tablet Take 1 tablet by mouth daily.  11/03/21  Yes Veryl Speak, FNP  fluticasone (FLONASE) 50 MCG/ACT nasal spray Place 2 sprays into both nostrils daily. 08/24/21  Yes Ellsworth Lennox, PA-C  ibuprofen (ADVIL) 800 MG tablet Take 1 tablet (800 mg total) by mouth every 8 (eight) hours as needed (pain). 11/11/21  Yes Tymeer Vaquera, Janace Aris, MD  tiZANidine (ZANAFLEX) 4 MG tablet Take 1 tablet (4 mg total) by mouth every 8 (eight) hours as needed for muscle spasms. 11/11/21  Yes Zlaty Alexa, Janace Aris, MD    Family History Family History  Problem Relation Age of Onset   Mental illness Mother     Social History Social History   Tobacco Use   Smoking status: Former    Types: Cigars   Smokeless tobacco: Never  Vaping Use   Vaping Use: Never used  Substance Use Topics   Alcohol use: Not Currently    Comment: socially   Drug use: Yes    Types: Marijuana     Allergies   Patient has no known allergies.   Review of Systems Review of Systems  Musculoskeletal:  Positive for back pain.     Physical Exam Triage Vital Signs ED Triage Vitals  Enc Vitals Group     BP 11/11/21 1134 120/68     Pulse Rate 11/11/21 1134 69  Resp 11/11/21 1134 16     Temp 11/11/21 1134 98.3 F (36.8 C)     Temp Source 11/11/21 1134 Oral     SpO2 11/11/21 1134 96 %     Weight --      Height --      Head Circumference --      Peak Flow --      Pain Score 11/11/21 1132 10     Pain Loc --      Pain Edu? --      Excl. in GC? --    No data found.  Updated Vital Signs BP 120/68 (BP Location: Left Arm)   Pulse 69   Temp 98.3 F (36.8 C) (Oral)   Resp 16   SpO2 96%   Visual Acuity Right Eye Distance:   Left Eye Distance:   Bilateral Distance:    Right Eye Near:   Left Eye Near:    Bilateral Near:     Physical Exam Vitals reviewed.  Constitutional:      General: He is not in acute distress.    Appearance: He is not ill-appearing, toxic-appearing or diaphoretic.  Cardiovascular:     Rate and Rhythm: Normal rate and regular  rhythm.  Pulmonary:     Effort: Pulmonary effort is normal.     Breath sounds: Normal breath sounds.  Musculoskeletal:     Comments: There is some tenderness of the left LS area.  Straight leg raise causes increased pain in the lower back.  Skin:    Coloration: Skin is not jaundiced or pale.  Neurological:     General: No focal deficit present.     Mental Status: He is alert and oriented to person, place, and time.  Psychiatric:        Behavior: Behavior normal.      UC Treatments / Results  Labs (all labs ordered are listed, but only abnormal results are displayed) Labs Reviewed - No data to display  EKG   Radiology No results found.  Procedures Procedures (including critical care time)  Medications Ordered in UC Medications  ketorolac (TORADOL) 30 MG/ML injection 30 mg (has no administration in time range)    Initial Impression / Assessment and Plan / UC Course  I have reviewed the triage vital signs and the nursing notes.  Pertinent labs & imaging results that were available during my care of the patient were reviewed by me and considered in my medical decision making (see chart for details).        Since no recent trauma, no x-rays done today.  Treatment is sent for pain and muscle relaxers.  Advised him to follow-up with his primary care for possible physical therapy orders Final Clinical Impressions(s) / UC Diagnoses   Final diagnoses:  Chronic left-sided low back pain with left-sided sciatica     Discharge Instructions      You have been given a shot of Toradol 30 mg today.  Take ibuprofen 800 mg--1 tab every 8 hours as needed for pain.   Take tizanidine 4 mg--1 every 8 hours as needed for muscle spasms  With your primary care, as they may end up needing ordered physical therapy to help your symptoms     ED Prescriptions     Medication Sig Dispense Auth. Provider   ibuprofen (ADVIL) 800 MG tablet Take 1 tablet (800 mg total) by mouth  every 8 (eight) hours as needed (pain). 21 tablet Marlinda Mike Janace Aris, MD   tiZANidine (ZANAFLEX)  4 MG tablet Take 1 tablet (4 mg total) by mouth every 8 (eight) hours as needed for muscle spasms. 30 tablet Gaege Sangalang, Gwenlyn Perking, MD      PDMP not reviewed this encounter.   Barrett Henle, MD 11/11/21 1159

## 2021-11-11 NOTE — Discharge Instructions (Signed)
You have been given a shot of Toradol 30 mg today.  Take ibuprofen 800 mg--1 tab every 8 hours as needed for pain.   Take tizanidine 4 mg--1 every 8 hours as needed for muscle spasms  With your primary care, as they may end up needing ordered physical therapy to help your symptoms

## 2021-11-11 NOTE — ED Triage Notes (Signed)
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 Having pain from the back left side and down the leg to the foot.   Pain is 10/10, unable to sit and hard to stand. Onset years ago, worse recently.

## 2021-11-12 ENCOUNTER — Ambulatory Visit: Payer: Medicaid Other | Admitting: Family

## 2021-11-28 ENCOUNTER — Other Ambulatory Visit: Payer: Self-pay | Admitting: Family

## 2021-11-28 ENCOUNTER — Other Ambulatory Visit (HOSPITAL_COMMUNITY): Payer: Self-pay

## 2021-11-28 DIAGNOSIS — Z21 Asymptomatic human immunodeficiency virus [HIV] infection status: Secondary | ICD-10-CM

## 2021-12-01 NOTE — Progress Notes (Incomplete)
12/01/2021  HPI: Steven [name] is a 31 y.o. male who presents to the [facility] pharmacy clinic for HIV follow-up.  Patient Active Problem List   Diagnosis Date Noted   Depression 05/23/2021   Economic hardship 05/23/2021   High risk homosexual behavior 07/18/2018   Healthcare maintenance 08/25/2016   History of syphilis 07/09/2016   Outbursts of anger 07/09/2016   HIV (human immunodeficiency virus infection) ([facility]CC) 06/29/2016    Patient's Medications  New Prescriptions   No medications on file  Previous Medications   DOLUTEGRAVIR-LAMIVUDINE (DOVATO) 50-300 MG TABLET    Take 1 tablet by mouth daily.   FLUTICASONE (FLONASE) 50 MCG/ACT NASAL SPRAY    Place 2 sprays into both nostrils daily.   IBUPROFEN (ADVIL) 800 MG TABLET    Take 1 tablet (800 mg total) by mouth every 8 (eight) hours as needed (pain).   TIZANIDINE (ZANAFLEX) 4 MG TABLET    Take 1 tablet (4 mg total) by mouth every 8 (eight) hours as needed for muscle spasms.  Modified Medications   No medications on file  Discontinued Medications   No medications on file    Allergies: No Known Allergies  Past Medical History: Past Medical History:  Diagnosis Date   Depression 07/09/2016   History of kidney stones    History of syphilis 07/09/2016   HIV (human immunodeficiency virus infection) (HCC) 06/29/2016   Dx 06/10/2016   Kidney stone    kidney stones    Social History: Social History   Socioeconomic History   Marital status: Single    Spouse name: Not on file   Number of children: Not on file   Years of education: Not on file   Highest education level: Not on file  Occupational History   Not on file  Tobacco Use   Smoking status: Former    Types: Cigars   Smokeless tobacco: Never  Vaping Use   Vaping Use: Never used  Substance and Sexual Activity   Alcohol use: Not Currently    Comment: socially   Drug use: Yes    Types: Marijuana   Sexual activity: Not Currently    Partners: Male    Birth  control/protection: Condom    Comment: declined condoms  Other Topics Concern   Not on file  Social History Narrative   Not on file   Social Determinants of Health   Financial Resource Strain: Not on file  Food Insecurity: Not on file  Transportation Needs: Not on file  Physical Activity: Not on file  Stress: Not on file  Social Connections: Not on file    Labs: Lab Results  Component Value Date   HIV1RNAQUANT <20 DETECTED (A) 05/22/2021   HIV1RNAQUANT Not Detected 01/10/2021   HIV1RNAQUANT Not Detected 09/10/2020   CD4TABS 581 05/22/2021   CD4TABS 721 09/10/2020   CD4TABS 674 04/04/2020    RPR and STI Lab Results  Component Value Date   LABRPR REACTIVE (A) 05/22/2021   LABRPR REACTIVE (A) 01/10/2021   LABRPR REACTIVE (A) 09/10/2020   LABRPR REACTIVE (A) 04/04/2020   LABRPR REACTIVE (A) 12/07/2019   RPRTITER 1:2 (H) 05/22/2021   RPRTITER 1:1 (H) 01/10/2021   RPRTITER 1:1 (H) 09/10/2020   RPRTITER 1:2 (H) 04/04/2020   RPRTITER 1:2 (H) 12/07/2019    STI Results GC CT  08/24/2021  5:15 PM Negative  Negative   05/22/2021  2:09 PM Negative    Negative    Negative  Negative    Negative    Positive  04/04/2020  4:33 PM Negative    Negative    Negative  Negative    Negative    Negative   12/07/2019  3:38 PM Negative  Negative   05/23/2019  9:58 AM Positive    Negative  Negative    Negative   10/17/2018 10:42 AM Positive  Negative   07/18/2018 12:00 AM Negative    Negative  Negative    Negative   03/22/2018 12:00 AM **POSITIVE**    **POSITIVE**    Negative  Negative    **POSITIVE**    Negative   12/17/2017 12:00 AM Negative    Negative    Negative  **POSITIVE**    Negative    Negative   01/28/2017 12:00 AM Negative  Negative   12/18/2016 12:00 AM Negative    Negative    Negative  Negative    Negative    Negative   08/25/2016 12:00 AM **POSITIVE**    Negative  Negative    Negative   07/09/2016 12:00 AM Negative    Negative  Negative     Negative   06/10/2016 12:00 AM Negative  Negative     Hepatitis B Lab Results  Component Value Date   HEPBSAB NEG 07/09/2016   HEPBSAG NON-REACTIVE 12/17/2017   Hepatitis C Lab Results  Component Value Date   HEPCAB NON-REACTIVE 12/17/2017   Hepatitis A Lab Results  Component Value Date   HAV NON-REACTIVE 12/17/2017   Lipids: Lab Results  Component Value Date   CHOL 164 01/10/2021   TRIG 36 01/10/2021   HDL 80 01/10/2021   CHOLHDL 2.1 01/10/2021   VLDL 6 07/09/2016   LDLCALC 74 01/10/2021    Current HIV Regimen: Dovato PO daily; last filled 11/05/21 for 30 day supply  Assessment: Steven [name] presents today for his HIV follow-up visit. He has been on Dovato and reports _____. He was offered STI screening and condoms which he ____.   He is eligible for the flu, COVID, Hep A, 2nd Hep B and 3rd HPV vaccines. After discussing the risks and benefits, he ______.  Plan: - Administer the ____ today - Collect _____ today - Follow-up in _____ with Dr. Millbrook Nation, [facility]

## 2021-12-01 NOTE — Telephone Encounter (Signed)
Refill pending. Appt tomorrow due to multiple no shows

## 2021-12-02 ENCOUNTER — Other Ambulatory Visit (HOSPITAL_COMMUNITY): Payer: Self-pay

## 2021-12-02 ENCOUNTER — Ambulatory Visit: Payer: Medicaid Other | Admitting: Pharmacist

## 2021-12-02 ENCOUNTER — Telehealth: Payer: Self-pay

## 2021-12-02 MED ORDER — DOVATO 50-300 MG PO TABS
1.0000 | ORAL_TABLET | Freq: Every day | ORAL | 0 refills | Status: DC
  Filled 2021-12-02: qty 30, 30d supply, fill #0

## 2021-12-02 NOTE — Telephone Encounter (Signed)
Patient missed appointment with pharmacy today, called to reschedule. Call could not be completed.   Will attempt to contact patient's case manager with [facility].  Sandie Ano, RN

## 2021-12-23 ENCOUNTER — Other Ambulatory Visit: Payer: Self-pay

## 2021-12-25 ENCOUNTER — Other Ambulatory Visit: Payer: Self-pay

## 2022-01-01 ENCOUNTER — Other Ambulatory Visit (HOSPITAL_COMMUNITY): Payer: Self-pay

## 2022-01-08 ENCOUNTER — Other Ambulatory Visit (HOSPITAL_COMMUNITY): Payer: Self-pay

## 2022-01-08 ENCOUNTER — Ambulatory Visit (INDEPENDENT_AMBULATORY_CARE_PROVIDER_SITE_OTHER): Payer: Medicaid Other | Admitting: Family

## 2022-01-08 ENCOUNTER — Other Ambulatory Visit: Payer: Self-pay

## 2022-01-08 ENCOUNTER — Encounter: Payer: Self-pay | Admitting: Family

## 2022-01-08 VITALS — BP 113/71 | HR 74 | Temp 97.9°F | Wt 249.6 lb

## 2022-01-08 DIAGNOSIS — Z79899 Other long term (current) drug therapy: Secondary | ICD-10-CM

## 2022-01-08 DIAGNOSIS — Z0000 Encounter for general adult medical examination without abnormal findings: Secondary | ICD-10-CM | POA: Diagnosis not present

## 2022-01-08 DIAGNOSIS — Z23 Encounter for immunization: Secondary | ICD-10-CM | POA: Diagnosis not present

## 2022-01-08 DIAGNOSIS — F3341 Major depressive disorder, recurrent, in partial remission: Secondary | ICD-10-CM

## 2022-01-08 DIAGNOSIS — Z113 Encounter for screening for infections with a predominantly sexual mode of transmission: Secondary | ICD-10-CM | POA: Diagnosis not present

## 2022-01-08 DIAGNOSIS — Z21 Asymptomatic human immunodeficiency virus [HIV] infection status: Secondary | ICD-10-CM

## 2022-01-08 MED ORDER — QUETIAPINE FUMARATE 50 MG PO TABS
50.0000 mg | ORAL_TABLET | Freq: Every day | ORAL | 5 refills | Status: DC
Start: 2022-01-08 — End: 2022-06-15
  Filled 2022-01-08: qty 30, 30d supply, fill #0
  Filled 2022-03-28: qty 30, 30d supply, fill #1
  Filled 2022-03-30: qty 30, 30d supply, fill #2

## 2022-01-08 MED ORDER — LAMOTRIGINE 100 MG PO TABS
100.0000 mg | ORAL_TABLET | Freq: Every day | ORAL | 5 refills | Status: DC
  Filled 2022-01-08: qty 30, 30d supply, fill #0
  Filled 2022-03-28: qty 30, 30d supply, fill #1

## 2022-01-08 MED ORDER — DOVATO 50-300 MG PO TABS
1.0000 | ORAL_TABLET | Freq: Every day | ORAL | 6 refills | Status: DC
  Filled 2022-01-08 (×3): qty 30, 30d supply, fill #0
  Filled 2022-03-28 – 2022-04-17 (×2): qty 30, 30d supply, fill #1
  Filled 2022-05-12: qty 30, 30d supply, fill #2

## 2022-01-08 NOTE — Patient Instructions (Addendum)
Nice to see you.  We will check your lab work today.  Continue to take your medication daily as prescribed.  Refills have been sent to the pharmacy.  Plan for follow up in 1 months or sooner if needed with lab work on the same day.  Have a great day and stay safe!  

## 2022-01-08 NOTE — Assessment & Plan Note (Signed)
Currently on lamotrigine and quetiapine and is sleeping well although continues to have episodes of mood swings and depression at times. No suicidal ideations or signs of psychosis. Does have increased stress and recommended he try counseling to help with CBT and coping mechanisms. Increase lamotrigine to 100 mg daily and continue current dose of quetiapine at 50 mg. Will follow up in 1 month for therapeutic drug monitoring.

## 2022-01-08 NOTE — Assessment & Plan Note (Signed)
Steven [name] continues to have well controlled virus with good adherence and tolerance to Dovato. Reviewed previous lab work and dicussed plan of care. Check lab work today. Continue current dose of Dovato. Plan for follow up in 4 months or sooner if needed with lab work on the same day.

## 2022-01-08 NOTE — Assessment & Plan Note (Signed)
Discussed importance of safe sexual practice and condom use. Condoms and STD testing offered.  Influenza vaccination updated.  

## 2022-01-08 NOTE — Progress Notes (Signed)
Brief Narrative   Patient ID: Steven [name], male    DOB: [birth year], 32 y.o.   MRN: [id number]  Steven [name] is a 32 y/o AA male with HIV disease diagnosed in June 2018. Genotype without significant resistance. Initial CD4 count was 280 and initial viral load of 55,300. No history of opportunistic infection. No previous treatment regimens prior to [place], [place].    Subjective:    Chief Complaint  Patient presents with   Follow-up    B20 - c/o chest pain x 3 weeks. Patient reports also having lower right leg pain.     HPI:  Steven [name] is a 32 y.o. male with HIV disease last seen through telehealth [name] on 07/16/21 with well controlled virus and good adherence and tolerance to Dovato. Viral load was undetectable and CD4 count 581. RPR titer was stable at 1:2 with previous treatment at 1:8. Renal function, hepatic function and electrolytes within normal ranges. Here today for routine follow up.  Steven [name] and has increased stress levels which are possibly leading to the chest pain that he has been experiencing on/off over the past 2 weeks. Having a "warm fluid" feeling in his right calf at times. Increased stress levels from home and work. Has been taking Dovato, Lamictal, and Seroquel with no adverse side effects or problems obtaining from the pharmacy. Economic situation is improved and housing and food are stable. Condoms and STD testing offered. Due for influenza vaccination.   Denies fevers, chills, night sweats, headaches, changes in vision, neck pain/stiffness, nausea, diarrhea, vomiting, lesions or rashes.   No Known Allergies    Outpatient Medications Prior to [name]  Medication Sig Dispense Refill   dolutegravir-lamiVUDine (DOVATO) 50-300 MG tablet Take 1 tablet by mouth daily. 30 tablet 0   fluticasone (FLONASE) 50 MCG/ACT nasal spray Place 2 sprays into both nostrils daily. (Patient not taking: Reported on 01/08/2022) 11.1 mL  2   ibuprofen (ADVIL) 800 MG tablet Take 1 tablet (800 mg total) by mouth every 8 (eight) hours as needed (pain). (Patient not taking: Reported on 01/08/2022) 21 tablet 0   tiZANidine (ZANAFLEX) 4 MG tablet Take 1 tablet (4 mg total) by mouth every 8 (eight) hours as needed for muscle spasms. (Patient not taking: Reported on 01/08/2022) 30 tablet 0   No facility-administered medications prior to [name].     Past Medical History:  Diagnosis Date   Depression 07/09/2016   History of kidney stones    History of syphilis 07/09/2016   HIV (human immunodeficiency virus infection) ([facility]) 06/29/2016   Dx 06/10/2016   Kidney stone    kidney stones     Past Surgical History:  Procedure Laterality Date   FACIAL LACERATION REPAIR N/A 11/16/2020   Procedure: REPAIR OF COMPLEX  LIP LACERATION;  Surgeon: Jason Coop, DO;  Location: [facility];  Service: ENT;  Laterality: N/A;   FINGER SURGERY Left    ring finger   NOSE SURGERY        Review of Systems  Constitutional:  Negative for appetite change, chills, fatigue, fever and unexpected weight change.  Eyes:  Negative for visual disturbance.  Respiratory:  Negative for cough, chest tightness, shortness of breath and wheezing.   Cardiovascular:  Negative for chest pain and leg swelling.  Gastrointestinal:  Negative for abdominal pain, constipation, diarrhea, nausea and vomiting.  Genitourinary:  Negative for dysuria, flank pain, frequency, genital sores, hematuria and urgency.  Skin:  Negative for  rash.  Allergic/Immunologic: Negative for immunocompromised state.  Neurological:  Negative for dizziness and headaches.      Objective:    BP 113/71   Pulse 74   Temp 97.9 F (36.6 C) (Oral)   Wt 249 lb 9.6 oz (113.2 kg)   SpO2 100%   BMI 34.81 kg/m  Nursing note and vital signs reviewed.  Physical Exam Constitutional:      General: He is not in acute distress.    Appearance: He is well-developed.  Eyes:     Conjunctiva/sclera:  Conjunctivae normal.  Cardiovascular:     Rate and Rhythm: Normal rate and regular rhythm.     Heart sounds: Normal heart sounds. No murmur heard.    No friction rub. No gallop.  Pulmonary:     Effort: Pulmonary effort is normal. No respiratory distress.     Breath sounds: Normal breath sounds. No wheezing or rales.  Chest:     Chest wall: No tenderness.  Abdominal:     General: Bowel sounds are normal.     Palpations: Abdomen is soft.     Tenderness: There is no abdominal tenderness.  Musculoskeletal:     Cervical back: Neck supple.  Lymphadenopathy:     Cervical: No cervical adenopathy.  Skin:    General: Skin is warm and dry.     Findings: No rash.  Neurological:     Mental Status: He is alert and oriented to person, place, and time.  Psychiatric:        Behavior: Behavior normal.        Thought Content: Thought content normal.        Judgment: Judgment normal.         01/08/2022   10:06 AM 05/22/2021    1:37 PM 01/20/2021    6:07 PM 01/10/2021   11:52 AM 09/10/2020    3:03 PM  Depression screen PHQ 2/9  Decreased Interest 0 3  0 0  Down, Depressed, Hopeless 0 3  1 0  PHQ - 2 Score 0 6  1 0  Altered sleeping       Tired, decreased energy       Change in appetite       Feeling bad or failure about yourself        Trouble concentrating       Moving slowly or fidgety/restless       Suicidal thoughts       PHQ-9 Score       Difficult doing work/chores          Information is confidential and restricted. Go to Review Flowsheets to unlock data.       Assessment & Plan:    Patient Active Problem List   Diagnosis Date Noted   Depression 05/23/2021   Economic hardship 05/23/2021   High risk homosexual behavior 07/18/2018   Healthcare maintenance 08/25/2016   History of syphilis 07/09/2016   Outbursts of anger 07/09/2016   HIV (human immunodeficiency virus infection) ([facility]) 06/29/2016     Problem List Items Addressed This [name]       Other   HIV (human  immunodeficiency virus infection) (HCC) (Chronic)    Steven [name] continues to have well controlled virus with good adherence and tolerance to Dovato. Reviewed previous lab work and dicussed plan of care. Check lab work today. Continue current dose of Dovato. Plan for follow up in 4 months or sooner if needed with lab work on the same day.  Relevant Medications   dolutegravir-lamiVUDine (DOVATO) 50-300 MG tablet   Other Relevant Orders   COMPLETE METABOLIC PANEL WITH GFR   HIV-1 RNA quant-no reflex-bld   T-helper cell (CD4)- ([facility] only)   Healthcare maintenance - Primary    Discussed importance of safe sexual practice and condom use. Condoms and STD testing offered.  Influenza vaccination updated.       Depression    Currently on lamotrigine and quetiapine and is sleeping well although continues to have episodes of mood swings and depression at times. No suicidal ideations or signs of psychosis. Does have increased stress and recommended he try counseling to help with CBT and coping mechanisms. Increase lamotrigine to 100 mg daily and continue current dose of quetiapine at 50 mg. Will follow up in 1 month for therapeutic drug monitoring.       Other [name] Diagnoses     Pharmacologic therapy       Relevant Orders   Lipid panel   Screening for STDs (sexually transmitted diseases)       Relevant Orders   RPR   Need for immunization against influenza       Relevant Orders   Flu Vaccine QUAD 39mo+IM (Fluarix, Fluzone & Alfiuria Quad PF) (Completed)        I have discontinued Barnie Ades's fluticasone, ibuprofen, and tiZANidine. I am also having him start on QUEtiapine and lamoTRIgine. Additionally, I am having him maintain his Dovato.   Meds ordered this encounter  Medications   dolutegravir-lamiVUDine (DOVATO) 50-300 MG tablet    Sig: Take 1 tablet by mouth daily.    Dispense:  30 tablet    Refill:  6    Order Specific Question:   Supervising Provider    Answer:    Baxter Flattery, CYNTHIA [[id number]]   QUEtiapine (SEROQUEL) 50 MG tablet    Sig: Take 1 tablet (50 mg total) by mouth at bedtime.    Dispense:  30 tablet    Refill:  5    Order Specific Question:   Supervising Provider    Answer:   Baxter Flattery, CYNTHIA [[id number]]   lamoTRIgine (LAMICTAL) 100 MG tablet    Sig: Take 1 tablet (100 mg total) by mouth daily.    Dispense:  30 tablet    Refill:  5    Order Specific Question:   Supervising Provider    Answer:   Carlyle Basques [[id number]]     Follow-up: Return in about 1 month (around 02/08/2022), or if symptoms worsen or fail to improve.   Terri Piedra, MSN, FNP-C Nurse Practitioner [facility] [place] number: [phone]

## 2022-01-09 ENCOUNTER — Other Ambulatory Visit (HOSPITAL_COMMUNITY): Payer: Self-pay

## 2022-01-09 LAB — T-HELPER CELL (CD4) - (RCID CLINIC ONLY)
CD4 % Helper T Cell: 36 % (ref 33–65)
CD4 T Cell Abs: 1067 /uL (ref 400–1790)

## 2022-01-11 LAB — COMPLETE METABOLIC PANEL WITH GFR
AG Ratio: 2.1 (calc) (ref 1.0–2.5)
ALT: 19 U/L (ref 9–46)
AST: 14 U/L (ref 10–40)
Albumin: 4.7 g/dL (ref 3.6–5.1)
Alkaline phosphatase (APISO): 52 U/L (ref 36–130)
BUN: 12 mg/dL (ref 7–25)
CO2: 26 mmol/L (ref 20–32)
Calcium: 9.8 mg/dL (ref 8.6–10.3)
Chloride: 105 mmol/L (ref 98–110)
Creat: 0.91 mg/dL (ref 0.60–1.24)
Globulin: 2.2 g/dL (calc) (ref 1.9–3.7)
Glucose, Bld: 87 mg/dL (ref 65–99)
Potassium: 4.4 mmol/L (ref 3.5–5.3)
Sodium: 139 mmol/L (ref 135–146)
Total Bilirubin: 0.2 mg/dL (ref 0.2–1.2)
Total Protein: 6.9 g/dL (ref 6.1–8.1)
eGFR: 117 mL/min/{1.73_m2} (ref >=60)

## 2022-01-11 LAB — LIPID PANEL
Cholesterol: 146 mg/dL (ref <200)
HDL: 72 mg/dL (ref >=40)
LDL Cholesterol (Calc): 61 mg/dL (calc)
Non-HDL Cholesterol (Calc): 74 mg/dL (calc) (ref <130)
Total CHOL/HDL Ratio: 2 (calc) (ref <5.0)
Triglycerides: 53 mg/dL (ref <150)

## 2022-01-11 LAB — HIV-1 RNA QUANT-NO REFLEX-BLD
HIV 1 RNA Quant: <20 Copies/mL — ABNORMAL HIGH
HIV-1 RNA Quant, Log: <1.3 Log cps/mL — ABNORMAL HIGH

## 2022-01-11 LAB — T PALLIDUM AB: T Pallidum Abs: POSITIVE — AB

## 2022-01-11 LAB — RPR: RPR Ser Ql: REACTIVE — AB

## 2022-01-11 LAB — RPR TITER: RPR Titer: 1:32 {titer} — ABNORMAL HIGH

## 2022-01-12 ENCOUNTER — Encounter (HOSPITAL_COMMUNITY): Payer: Self-pay

## 2022-01-12 ENCOUNTER — Telehealth: Payer: Self-pay

## 2022-01-12 ENCOUNTER — Other Ambulatory Visit (HOSPITAL_COMMUNITY): Payer: Self-pay

## 2022-01-12 NOTE — Telephone Encounter (Signed)
Patient made aware of all lab results and informed of +RPR. Staff advised of  recommended treatment and confirmed allergies. Advised patient to remain abstinent for 7-10 days after treatment, and that the health department may follow up with him to confirm treatment. Patient's questions answered to their satisfaction. Patient accepted nurse visit for 01/13/22  Eugenia Mcalpine, LPN

## 2022-01-12 NOTE — Telephone Encounter (Signed)
-----   Message from [place], [place] sent at 01/12/2022  2:59 PM EST ----- Please inform Steven [name] that his titer was positive for syphilis and will need to be treated with 2.4 million units of Bicillin once. Thanks.

## 2022-01-13 ENCOUNTER — Other Ambulatory Visit: Payer: Self-pay

## 2022-01-13 ENCOUNTER — Ambulatory Visit (INDEPENDENT_AMBULATORY_CARE_PROVIDER_SITE_OTHER): Payer: Medicaid Other

## 2022-01-13 DIAGNOSIS — A539 Syphilis, unspecified: Secondary | ICD-10-CM | POA: Diagnosis not present

## 2022-01-13 MED ORDER — PENICILLIN G BENZATHINE 1200000 UNIT/2ML IM SUSY
1.2000 10*6.[IU] | PREFILLED_SYRINGE | Freq: Once | INTRAMUSCULAR | Status: AC
  Administered 2022-01-13: 1.2 10*6.[IU] via INTRAMUSCULAR

## 2022-01-13 NOTE — Progress Notes (Signed)
Reviewed and verified allergies with patient. Patient tolerated Bicillin injections well. Reinforced abstinence until treatment completed plus an additional 10 days, offered condoms and encouraged use. Advised patient to notify sexual partners for testing and treatment. Patient verbalized understanding.   Karalee Hauter D Keaton Beichner, RN  

## 2022-01-20 ENCOUNTER — Ambulatory Visit: Payer: Medicaid Other

## 2022-01-26 ENCOUNTER — Other Ambulatory Visit (HOSPITAL_COMMUNITY): Payer: Self-pay

## 2022-02-04 ENCOUNTER — Other Ambulatory Visit (HOSPITAL_COMMUNITY): Payer: Self-pay

## 2022-02-06 ENCOUNTER — Other Ambulatory Visit (HOSPITAL_COMMUNITY): Payer: Self-pay

## 2022-03-30 ENCOUNTER — Other Ambulatory Visit (HOSPITAL_COMMUNITY): Payer: Self-pay

## 2022-04-09 ENCOUNTER — Other Ambulatory Visit (HOSPITAL_COMMUNITY): Payer: Self-pay

## 2022-04-16 ENCOUNTER — Encounter (HOSPITAL_COMMUNITY): Payer: Self-pay

## 2022-04-16 ENCOUNTER — Ambulatory Visit (HOSPITAL_COMMUNITY)
Admission: EM | Admit: 2022-04-16 | Discharge: 2022-04-16 | Disposition: A | Discharge status: Home / Self Care | Payer: Medicaid Other | Attending: Internal Medicine | Admitting: Internal Medicine

## 2022-04-16 DIAGNOSIS — K047 Periapical abscess without sinus: Secondary | ICD-10-CM | POA: Diagnosis not present

## 2022-04-16 DIAGNOSIS — Z113 Encounter for screening for infections with a predominantly sexual mode of transmission: Secondary | ICD-10-CM

## 2022-04-16 DIAGNOSIS — R599 Enlarged lymph nodes, unspecified: Secondary | ICD-10-CM

## 2022-04-16 LAB — COMPREHENSIVE METABOLIC PANEL
ALT: 25 U/L (ref 0–44)
AST: 28 U/L (ref 15–41)
Albumin: 4.5 g/dL (ref 3.5–5.0)
Alkaline Phosphatase: 59 U/L (ref 38–126)
Anion gap: 10 (ref 5–15)
BUN: 9 mg/dL (ref 6–20)
CO2: 25 mmol/L (ref 22–32)
Calcium: 9.3 mg/dL (ref 8.9–10.3)
Chloride: 102 mmol/L (ref 98–111)
Creatinine, Ser: 1.02 mg/dL (ref 0.61–1.24)
GFR, Estimated: >60 mL/min (ref >60)
Glucose, Bld: 75 mg/dL (ref 70–99)
Potassium: 4 mmol/L (ref 3.5–5.1)
Sodium: 137 mmol/L (ref 135–145)
Total Bilirubin: 0.7 mg/dL (ref 0.3–1.2)
Total Protein: 7.2 g/dL (ref 6.5–8.1)

## 2022-04-16 LAB — CBC
HCT: 44.8 % (ref 39.0–52.0)
Hemoglobin: 15.4 g/dL (ref 13.0–17.0)
MCH: 30.3 pg (ref 26.0–34.0)
MCHC: 34.4 g/dL (ref 30.0–36.0)
MCV: 88 fL (ref 80.0–100.0)
Platelets: 180 10*3/uL (ref 150–400)
RBC: 5.09 MIL/uL (ref 4.22–5.81)
RDW: 13.1 % (ref 11.5–15.5)
WBC: 7.2 10*3/uL (ref 4.0–10.5)
nRBC: 0 % (ref 0.0–0.2)

## 2022-04-16 MED ORDER — AMOXICILLIN-POT CLAVULANATE 875-125 MG PO TABS: 1.0000 | ORAL_TABLET | Freq: Two times a day (BID) | ORAL | 0 refills | Status: DC

## 2022-04-16 NOTE — Discharge Instructions (Signed)
I have prescribed an antibiotic given concern for dental infection.  Please follow-up with dentist for further evaluation of this.  If any swelling persists or worsens, please go to the emergency department to have imaging completed.  I recommend that you follow-up with family medicine doctor as well.  Blood work is pending as well as STD testing.  Will call if anything is abnormal.  Please refrain from sexual activity until test results and treatment are complete.  Also recommend that you follow-up with your infectious disease doctor as well.

## 2022-04-16 NOTE — ED Provider Notes (Signed)
[facility]    CSN: [id number] Arrival date & time: 04/16/22  1208      History   Chief Complaint Chief Complaint  Patient presents with   wants STD testing   Abscess    HPI Steven [name] is a 32 y.o. male.   Patient presents with several different chief complaints today.  Patient reports that he has some swelling in the left side of his neck as well as in the nose and upper mouth.  He reports this started about 2 days ago.  He denies any injury to any of the areas.  Denies fever, cough, nasal congestion, runny nose.  Reports he feels some swelling and is concerned for abscess on his left nare.  Patient also reporting bilateral groin lymph node swelling that started about 2 days ago as well.  He denies penile discharge, testicular pain or swelling, abdominal pain, back pain, fever.  Patient requesting STD testing.  He reports that he has been having unprotected intercourse with his sexual partner who has intercourse with other people. He reports oral and penetrative intercourse.  He would like testing of the oral cavity, rectum, penis.  Significant health history includes HIV.  Patient also tested positive for syphilis in January and was treated.   Abscess   Past Medical History:  Diagnosis Date   Depression 07/09/2016   History of kidney stones    History of syphilis 07/09/2016   HIV (human immunodeficiency virus infection) 06/29/2016   Dx 06/10/2016   Kidney stone    kidney stones    Patient Active Problem List   Diagnosis Date Noted   Depression 05/23/2021   Economic hardship 05/23/2021   High risk homosexual behavior 07/18/2018   Healthcare maintenance 08/25/2016   History of syphilis 07/09/2016   Outbursts of anger 07/09/2016   HIV (human immunodeficiency virus infection) 06/29/2016    Past Surgical History:  Procedure Laterality Date   FACIAL LACERATION REPAIR N/A 11/16/2020   Procedure: REPAIR OF COMPLEX  LIP LACERATION;  Surgeon: Laren Boom, DO;  Location[facility];  Service: ENT;  Laterality: N/A;   FINGER SURGERY Left    ring finger   NOSE SURGERY         Home Medications    Prior to Admission medications   Medication Sig Start Date End Date Taking? Authorizing Provider  amoxicillin-clavulanate (AUGMENTIN) 875-125 MG tablet Take 1 tablet by mouth every 12 (twelve) hours. 04/16/22  Yes , Rolly Salter E, FNP  dolutegravir-lamiVUDine (DOVATO) 50-300 MG tablet Take 1 tablet by mouth daily. 01/08/22   Veryl Speak, FNP  lamoTRIgine (LAMICTAL) 100 MG tablet Take 1 tablet (100 mg total) by mouth daily. 01/08/22   Veryl Speak, FNP  QUEtiapine (SEROQUEL) 50 MG tablet Take 1 tablet (50 mg total) by mouth at bedtime. 01/08/22   Veryl Speak, FNP    Family History Family History  Problem Relation Age of Onset   Mental illness Mother     Social History Social History   Tobacco Use   Smoking status: Former    Types: Cigars   Smokeless tobacco: Never  Vaping Use   Vaping Use: Every day   Substances: Nicotine  Substance Use Topics   Alcohol use: Not Currently    Comment: socially   Drug use: Yes    Types: Marijuana     Allergies   Patient has no known allergies.   Review of Systems Review of Systems Per HPI  Physical Exam Triage Vital  Signs ED Triage Vitals [04/16/22 1252]  Enc Vitals Group     BP 122/79     Pulse Rate 85     Resp 16     Temp 98.7 F (37.1 C)     Temp Source Oral     SpO2 95 %     Weight      Height      Head Circumference      Peak Flow      Pain Score 10     Pain Loc      Pain Edu?      Excl. in GC?    No data found.  Updated Vital Signs BP 122/79 (BP Location: Left Arm)   Pulse 85   Temp 98.7 F (37.1 C) (Oral)   Resp 16   SpO2 95%   Visual Acuity Right Eye Distance:   Left Eye Distance:   Bilateral Distance:    Right Eye Near:   Left Eye Near:    Bilateral Near:     Physical Exam Exam conducted with a chaperone present.  Constitutional:       General: He is not in acute distress.    Appearance: Normal appearance. He is not toxic-appearing or diaphoretic.  HENT:     Head: Normocephalic and atraumatic.     Nose:     Comments: No obvious abscess in the left nare.  There appears to be some minimal swelling which could be due to nasal congestion.  No obvious swelling to the upper portion of the face directly beside nose.  Nose appears normal.  No erythema or discoloration.  No tenderness to palpation surrounding nose.  Patient does have some tenderness to palpation directly above upper lip.    Mouth/Throat:     Comments: Has some erythema and very mild gingival swelling present to left upper anterior dentition and gingiva.  No obvious abscess.  Tenderness to palpation in this area as well.  Posterior pharynx appears normal with no swelling or erythema. Eyes:     Extraocular Movements: Extraocular movements intact.     Conjunctiva/sclera: Conjunctivae normal.  Pulmonary:     Effort: Pulmonary effort is normal.  Genitourinary:    Penis: Normal.      Testes: Normal.     Comments: No obvious lymph node swelling.  Lymphadenopathy:     Cervical: Cervical adenopathy present.     Left cervical: Superficial cervical adenopathy present.     Lower Body: No right inguinal adenopathy. No left inguinal adenopathy.  Neurological:     General: No focal deficit present.     Mental Status: He is alert and oriented to person, place, and time. Mental status is at baseline.  Psychiatric:        Mood and Affect: Mood normal.        Behavior: Behavior normal.        Thought Content: Thought content normal.        Judgment: Judgment normal.      UC Treatments / Results  Labs (all labs ordered are listed, but only abnormal results are displayed) Labs Reviewed  CBC  COMPREHENSIVE METABOLIC PANEL  RPR    EKG   Radiology No results found.  Procedures Procedures (including critical care time)  Medications Ordered in UC Medications - No  data to display  Initial Impression / Assessment and Plan / UC Course  I have reviewed the triage vital signs and the nursing notes.  Pertinent labs & imaging results that were  available during my care of the patient were reviewed by me and considered in my medical decision making (see chart for details).     1.  Facial pain and cervical lymph node swelling  It appears the patient may have a dental infection.  There are no obvious abscess in the nasal cavity or face that is concerning.  Although, will treat with Augmentin which will treat both.  No signs of airway compromise which is reassuring but patient was encouraged to go to the ER if any symptoms persist or worsen.  Also encouraged follow-up with dentist.  Lymph node swelling in the neck is most likely due to dental infection but patient encouraged to follow-up if lymph node swelling is persistent or worsening.  Will obtain CMP and CBC to rule out any worrisome etiologies related to this.  2.  STD testing  Patient reporting inguinal lymph node swelling but there is none noted on exam.  If this has been present, it is most likely due to STD.  Cytology swab of the penis, rectum, oral cavity is pending.  Given no confirmed exposure to STD, will await results for any further treatment.  Patient advised to refrain from sexual activity until test results and treatment are complete.  RPR also repeated to ensure that syphilis was treated well and that lymph node swelling is not related to this.  Advised patient to follow-up with family medicine doctor and infectious disease doctor for further evaluation and management.  Patient verbalized understanding and was agreeable with plan. Final Clinical Impressions(s) / UC Diagnoses   Final diagnoses:  Dental infection  Swelling of lymph nodes  Screening examination for venereal disease     Discharge Instructions      I have prescribed an antibiotic given concern for dental infection.  Please  follow-up with dentist for further evaluation of this.  If any swelling persists or worsens, please go to the emergency department to have imaging completed.  I recommend that you follow-up with family medicine doctor as well.  Blood work is pending as well as STD testing.  Will call if anything is abnormal.  Please refrain from sexual activity until test results and treatment are complete.  Also recommend that you follow-up with your infectious disease doctor as well.    ED Prescriptions     Medication Sig Dispense Auth. Provider   amoxicillin-clavulanate (AUGMENTIN) 875-125 MG tablet Take 1 tablet by mouth every 12 (twelve) hours. 14 tablet Yetter, Acie Fredrickson, [place]      PDMP not reviewed this encounter.   Gustavus Bryant, [place] 04/16/22 [phone]

## 2022-04-16 NOTE — ED Triage Notes (Signed)
Patient is requesting STD testing. Patient states he has been having oral sex and his partner has sexual relations with other people.  Patient states he has an abscess in the left nares. Patient also reports swelling to the left upper gum area, left neck, and bilateral  groin areas.

## 2022-04-17 ENCOUNTER — Other Ambulatory Visit: Payer: Self-pay

## 2022-04-17 ENCOUNTER — Other Ambulatory Visit (HOSPITAL_COMMUNITY): Payer: Self-pay

## 2022-04-17 LAB — RPR
RPR Ser Ql: REACTIVE — AB
RPR Titer: 1:4 {titer}

## 2022-04-20 ENCOUNTER — Ambulatory Visit: Payer: Medicaid Other | Admitting: Physician Assistant

## 2022-04-20 ENCOUNTER — Encounter: Payer: Self-pay | Admitting: Physician Assistant

## 2022-04-20 VITALS — BP 110/87 | HR 92 | Ht 71.0 in | Wt 241.0 lb

## 2022-04-20 DIAGNOSIS — F332 Major depressive disorder, recurrent severe without psychotic features: Secondary | ICD-10-CM | POA: Diagnosis not present

## 2022-04-20 LAB — T.PALLIDUM AB, TOTAL: T Pallidum Abs: REACTIVE — AB

## 2022-04-20 NOTE — Patient Instructions (Signed)
You are going to take your Lamictal once daily in the AM and then take your Seroquel once daily at bedtime.  You are going to do this for 2 solid weeks.  You are going to return to the mobile unit in 2 weeks so we can evaluate how well your medication is working for you.  I did start a referral for you to be seen by psychiatry to help with counseling.  Roney Jaffe, PA-C Physician Assistant [facility] [email or web address]     Managing Depression, Adult Depression is a mental health condition that affects your thoughts, feelings, and actions. Being diagnosed with depression can bring you relief if you did not know why you have felt or behaved a certain way. It could also leave you feeling overwhelmed. Finding ways to manage your symptoms can help you feel more positive about your future. How to manage lifestyle changes Being depressed is difficult. Depression can increase the level of everyday stress. Stress can make depression symptoms worse. You may believe your symptoms cannot be managed or will never improve. However, there are many things you can try to help manage your symptoms. There is hope. Managing stress  Stress is your body's reaction to life changes and events, both good and bad. Stress can add to your feelings of depression. Learning to manage your stress can help lessen your feelings of depression. Try some of the following approaches to reducing your stress (stress reduction techniques): Listen to music that you enjoy and that inspires you. Try using a meditation app or take a meditation class. Develop a practice that helps you connect with your spiritual self. Walk in nature, pray, or go to a place of worship. Practice deep breathing. To do this, inhale slowly through your nose. Pause at the top of your inhale for a few seconds and then exhale slowly, letting yourself relax. Repeat this three or four times. Practice yoga  to help relax and work your muscles. Choose a stress reduction technique that works for you. These techniques take time and practice to develop. Set aside 5-15 minutes a day to do them. Therapists can offer training in these techniques. Do these things to help manage stress: Keep a journal. Know your limits. Set healthy boundaries for yourself and others, such as saying "no" when you think something is too much. Pay attention to how you react to certain situations. You may not be able to control everything, but you can change your reaction. Add humor to your life by watching funny movies or shows. Make time for activities that you enjoy and that relax you. Spend less time using electronics, especially at night before bed. The light from screens can make your brain think it is time to get up rather than go to bed.  Medicines Medicines, such as antidepressants, are often a part of treatment for depression. Talk with your pharmacist or health care provider about all the medicines, supplements, and herbal products that you take, their possible side effects, and what medicines and other products are safe to take together. Make sure to report any side effects you may have to your health care provider. Relationships Your health care provider may suggest family therapy, couples therapy, or individual therapy as part of your treatment. How to recognize changes Everyone responds differently to treatment for depression. As you recover from depression, you may start to: Have more interest in doing activities. Feel more hopeful. Have more energy. Eat a more regular amount of food. Have better  mental focus. It is important to recognize if your depression is not getting better or is getting worse. The symptoms you had in the beginning may return, such as: Feeling tired. Eating too much or too little. Sleeping too much or too little. Feeling restless, agitated, or hopeless. Trouble focusing or making  decisions. Having unexplained aches and pains. Feeling irritable, angry, or aggressive. If you or your family members notice these symptoms coming back, let your health care provider know right away. Follow these instructions at home: Activity Try to get some form of exercise each day, such as walking. Try yoga, mindfulness, or other stress reduction techniques. Participate in group activities if you are able. Lifestyle Get enough sleep. Cut down on or stop using caffeine, tobacco, alcohol, and any other harmful substances. Eat a healthy diet that includes plenty of vegetables, fruits, whole grains, low-fat dairy products, and lean protein. Limit foods that are high in solid fats, added sugar, or salt (sodium). General instructions Take over-the-counter and prescription medicines only as told by your health care provider. Keep all follow-up visits. It is important for your health care provider to check on your mood, behavior, and medicines. Your health care provider may need to make changes to your treatment. Where to find support Talking to others  Friends and family members can be sources of support and guidance. Talk to trusted friends or family members about your condition. Explain your symptoms and let them know that you are working with a health care provider to treat your depression. Tell friends and family how they can help. Finances Find mental health providers that fit with your financial situation. Talk with your health care provider if you are worried about access to food, housing, or medicine. Call your insurance company to learn about your co-pays and prescription plan. Where to find more information You can find support in your area from: Anxiety and Depression Association of America (ADAA): adaa.org Mental Health America: mentalhealthamerica.net The [employer] on Mental Illness: nami.org Contact a health care provider if: You stop taking your antidepressant medicines,  and you have any of these symptoms: Nausea. Headache. Light-headedness. Chills and body aches. Not being able to sleep (insomnia). You or your friends and family think your depression is getting worse. Get help right away if: You have thoughts of hurting yourself or others. Get help right away if you feel like you may hurt yourself or others, or have thoughts about taking your own life. Go to your nearest emergency room or: Call 911. Call the National Suicide Prevention Lifeline at 918-284-6901 or 988. This is open 24 hours a day. Text the Crisis Text Line at 534-386-0345. This information is not intended to replace advice given to you by your health care provider. Make sure you discuss any questions you have with your health care provider. Document Revised: 04/29/2021 Document Reviewed: 04/29/2021 Elsevier Patient Education  2023 [employer].

## 2022-04-20 NOTE — Progress Notes (Signed)
New Patient Office Visit  Subjective    Patient ID: Steven [name], male    DOB: 17-Nov-1990  Age: 32 y.o. MRN: [id number]  CC:  Chief Complaint  Patient presents with   Depression    Over thinking, Over eating, Feeling overwhelmed     HPI Steven [name] states that he has been having depressed moods.  States that he has been dwelling on the past, having difficulty getting out of bed.  Adamantly denies any thoughts of self-harm.  Does endorse that he has not been compliant to his Lamictal or Seroquel.  States that he was previously going to the [facility] and has previously gone to [place], but has had difficulty due to transportation issues.     04/20/2022    4:05 PM 01/08/2022   10:06 AM 05/22/2021    1:37 PM 01/20/2021    6:07 PM 01/10/2021   11:52 AM  Depression screen PHQ 2/9  Decreased Interest 3 0 3  0  Down, Depressed, Hopeless 3 0 3  1  PHQ - 2 Score 6 0 6  1  Altered sleeping 3      Tired, decreased energy 3      Change in appetite 3      Feeling bad or failure about yourself  3      Trouble concentrating 3      Moving slowly or fidgety/restless 1      Suicidal thoughts 2      PHQ-9 Score 24      Difficult doing work/chores Very difficult         Information is confidential and restricted. Go to Review Flowsheets to unlock data.      04/20/2022    4:05 PM 08/05/2016   10:04 AM  GAD 7 : Generalized Anxiety Score  Nervous, Anxious, on Edge 2 3  Control/stop worrying 3 2  Worry too much - different things 3 2  Trouble relaxing 3 3  Restless 3 1  Easily annoyed or irritable 3 3  Afraid - awful might happen 3 0  Total GAD 7 Score 20 14  Anxiety Difficulty Somewhat difficult        Outpatient Encounter Medications as of 04/20/2022  Medication Sig   dolutegravir-lamiVUDine (DOVATO) 50-300 MG tablet Take 1 tablet by mouth daily.   amoxicillin-clavulanate (AUGMENTIN) 875-125 MG tablet Take 1 tablet by mouth every 12 (twelve) hours. (Patient not taking:  Reported on 04/20/2022)   lamoTRIgine (LAMICTAL) 100 MG tablet Take 1 tablet (100 mg total) by mouth daily. (Patient not taking: Reported on 04/20/2022)   QUEtiapine (SEROQUEL) 50 MG tablet Take 1 tablet (50 mg total) by mouth at bedtime. (Patient not taking: Reported on 04/20/2022)   No facility-administered encounter medications on file as of 04/20/2022.    Past Medical History:  Diagnosis Date   Depression 07/09/2016   History of kidney stones    History of syphilis 07/09/2016   HIV (human immunodeficiency virus infection) 06/29/2016   Dx 06/10/2016   Kidney stone    kidney stones    Past Surgical History:  Procedure Laterality Date   FACIAL LACERATION REPAIR N/A 11/16/2020   Procedure: REPAIR OF COMPLEX  LIP LACERATION;  Surgeon: Laren Boom, DO;  Location: [facility] OR;  Service: ENT;  Laterality: N/A;   FINGER SURGERY Left    ring finger   NOSE SURGERY      Family History  Problem Relation Age of Onset   Mental illness Mother  Social History   Socioeconomic History   Marital status: Single    Spouse name: Not on file   Number of children: Not on file   Years of education: Not on file   Highest education level: Not on file  Occupational History   Not on file  Tobacco Use   Smoking status: Former    Types: Cigars   Smokeless tobacco: Never  Vaping Use   Vaping Use: Every day   Substances: Nicotine  Substance and Sexual Activity   Alcohol use: Not Currently    Comment: socially   Drug use: Yes    Types: Marijuana   Sexual activity: Not Currently    Partners: Male    Birth control/protection: Condom    Comment: declined condoms  Other Topics Concern   Not on file  Social History Narrative   Not on file   Social Determinants of Health   Financial Resource Strain: Not on file  Food Insecurity: Not on file  Transportation Needs: Not on file  Physical Activity: Not on file  Stress: Not on file  Social Connections: Not on file  Intimate Partner  Violence: Not on file    Review of Systems  Constitutional: Negative.   HENT: Negative.    Eyes: Negative.   Respiratory:  Negative for shortness of breath.   Cardiovascular:  Negative for chest pain.  Gastrointestinal: Negative.   Genitourinary: Negative.   Musculoskeletal: Negative.   Skin: Negative.   Neurological: Negative.   Endo/Heme/Allergies: Negative.   Psychiatric/Behavioral:  Positive for depression. The patient is nervous/anxious.         Objective    BP 110/87 (BP Location: Left Arm, Patient Position: Sitting, Cuff Size: Large)   Pulse 92   Ht  (1.803 m)   Wt 241 lb (109.3 kg)   SpO2 97%   BMI 33.61 kg/m   Physical Exam Vitals and nursing note reviewed.  Constitutional:      Appearance: Normal appearance.  HENT:     Head: Normocephalic and atraumatic.     Right Ear: External ear normal.     Left Ear: External ear normal.     Nose: Nose normal.     Mouth/Throat:     Mouth: Mucous membranes are moist.     Pharynx: Oropharynx is clear.  Eyes:     Extraocular Movements: Extraocular movements intact.     Conjunctiva/sclera: Conjunctivae normal.     Pupils: Pupils are equal, round, and reactive to light.  Cardiovascular:     Rate and Rhythm: Normal rate and regular rhythm.     Pulses: Normal pulses.     Heart sounds: Normal heart sounds.  Pulmonary:     Effort: Pulmonary effort is normal.     Breath sounds: Normal breath sounds.  Musculoskeletal:        General: Normal range of motion.     Cervical back: Normal range of motion and neck supple.  Skin:    General: Skin is warm and dry.  Neurological:     General: No focal deficit present.     Mental Status: He is alert and oriented to person, place, and time.  Psychiatric:        Mood and Affect: Mood normal.        Behavior: Behavior normal.        Thought Content: Thought content normal. Thought content does not include homicidal or suicidal ideation. Thought content does not include  homicidal or suicidal plan.  Cognition and Memory: Cognition and memory normal.        Judgment: Judgment normal.         Assessment & Plan:   Problem List Items Addressed This Visit       Other   Depression - Primary   Relevant Orders   Ambulatory referral to Psychiatry  1. Severe episode of recurrent major depressive disorder, without psychotic features Patient agreeable to restart medication and take on a daily basis agreeable to referral for CBT and medication management.  Patient education given on supportive care.  Red flags given for prompt reevaluation. - Ambulatory referral to Psychiatry   I have reviewed the patient's medical history (PMH, PSH, Social History, Family History, Medications, and allergies) , and have been updated if relevant. I spent 30 minutes reviewing chart and  face to face time with patient.     Return in about 2 weeks (around 05/04/2022) for with MMU.   Kasandra Knudsen Mayers, PA-C

## 2022-04-21 LAB — CYTOLOGY, (ORAL, ANAL, URETHRAL) ANCILLARY ONLY
Chlamydia: NEGATIVE
Chlamydia: NEGATIVE
Chlamydia: NEGATIVE
Comment: NEGATIVE
Comment: NEGATIVE
Comment: NEGATIVE
Comment: NEGATIVE
Comment: NEGATIVE
Comment: NEGATIVE
Comment: NORMAL
Comment: NORMAL
Comment: NORMAL
Neisseria Gonorrhea: NEGATIVE
Neisseria Gonorrhea: NEGATIVE
Neisseria Gonorrhea: NEGATIVE
Trichomonas: NEGATIVE
Trichomonas: NEGATIVE
Trichomonas: NEGATIVE

## 2022-05-12 ENCOUNTER — Other Ambulatory Visit (HOSPITAL_COMMUNITY): Payer: Self-pay

## 2022-05-13 ENCOUNTER — Other Ambulatory Visit: Payer: Self-pay

## 2022-05-18 ENCOUNTER — Ambulatory Visit: Payer: Medicaid Other | Admitting: Family

## 2022-06-03 ENCOUNTER — Other Ambulatory Visit (HOSPITAL_COMMUNITY): Payer: Self-pay

## 2022-06-11 ENCOUNTER — Other Ambulatory Visit (HOSPITAL_COMMUNITY): Payer: Self-pay

## 2022-06-15 ENCOUNTER — Other Ambulatory Visit (HOSPITAL_COMMUNITY): Payer: Self-pay

## 2022-06-15 ENCOUNTER — Ambulatory Visit (INDEPENDENT_AMBULATORY_CARE_PROVIDER_SITE_OTHER): Payer: Medicaid Other | Admitting: Family

## 2022-06-15 ENCOUNTER — Encounter: Payer: Self-pay | Admitting: Family

## 2022-06-15 ENCOUNTER — Other Ambulatory Visit: Payer: Self-pay

## 2022-06-15 VITALS — BP 114/80 | HR 84 | Temp 98.3°F | Ht 70.0 in | Wt 240.0 lb

## 2022-06-15 DIAGNOSIS — F3341 Major depressive disorder, recurrent, in partial remission: Secondary | ICD-10-CM | POA: Diagnosis not present

## 2022-06-15 DIAGNOSIS — Z8619 Personal history of other infectious and parasitic diseases: Secondary | ICD-10-CM | POA: Diagnosis not present

## 2022-06-15 DIAGNOSIS — Z23 Encounter for immunization: Secondary | ICD-10-CM

## 2022-06-15 DIAGNOSIS — Z21 Asymptomatic human immunodeficiency virus [HIV] infection status: Secondary | ICD-10-CM | POA: Diagnosis present

## 2022-06-15 DIAGNOSIS — Z0184 Encounter for antibody response examination: Secondary | ICD-10-CM

## 2022-06-15 MED ORDER — DOVATO 50-300 MG PO TABS
1.0000 | ORAL_TABLET | Freq: Every day | ORAL | 6 refills | Status: DC
Start: 2022-06-15 — End: 2022-10-16
  Filled 2022-06-15 – 2022-06-16 (×2): qty 30, 30d supply, fill #0

## 2022-06-15 MED ORDER — LAMOTRIGINE 100 MG PO TABS
100.0000 mg | ORAL_TABLET | Freq: Every day | ORAL | 5 refills | Status: DC
  Filled 2022-06-15: qty 30, 30d supply, fill #0
  Filled 2022-10-16: qty 30, 30d supply, fill #1

## 2022-06-15 MED ORDER — QUETIAPINE FUMARATE 50 MG PO TABS
50.0000 mg | ORAL_TABLET | Freq: Every day | ORAL | 5 refills | Status: DC
  Filled 2022-06-15: qty 30, 30d supply, fill #0
  Filled 2022-10-16: qty 30, 30d supply, fill #1

## 2022-06-15 NOTE — Assessment & Plan Note (Signed)
Pilot's mood continues to be labile with anhedonia and mood swings that are not well controlled with current dose of lamotrigine and quetiapine. No suicidal ideation or signs of psychosis. Encouraged counseling and cognitive behavorial therapy. Will place referral to psychiatry for optimization of medications. Information about [facility] provided in AVS.

## 2022-06-15 NOTE — Patient Instructions (Addendum)
Nice to see you.  We will check your lab work today.  Continue to take your medication daily as prescribed.  Refills have been sent to the pharmacy.  A referral has been sent to psychiatry.   Plan for follow up in 4 months or sooner if needed with lab work on the same day.   Have a great day and stay safe!  [facility] Phone: [phone] Address: [address] [place], [place] [address]

## 2022-06-15 NOTE — Assessment & Plan Note (Signed)
Steven [name] continues to have well controlled virus with good adherence and tolerance to Dovato. Reviewed previous lab work and discussed plan of care and U=U. Discussed possible changing medication however weight gain is likely multifactorial given mental health medications and current nutritional intake. Check lab work. Continue current dose of Dovato. Plan for follow up in 4 months or sooner if needed with lab work on the same day.

## 2022-06-15 NOTE — Progress Notes (Signed)
Brief Narrative   Patient ID: Steven [name], male    DOB: 1990/01/16, 32 y.o.   MRN: [id number]  Steven [name] is a 32 y/o AA male with HIV disease diagnosed in June 2018. Genotype without significant resistance. Initial CD4 count was 280 and initial viral load of 55,300. No history of opportunistic infection. No previous treatment regimens prior to [place], [place].   Subjective:    Chief Complaint  Patient presents with   Follow-up    HPI:  Steven [name] is a 32 y.o. male with HIV disease last seen on 01/08/2022 with well-controlled virus and good adherence and tolerance to Dovato. Viral load is undetectable with CD4 count 1067.  RPR titer was positive at 1: 32 and treated with 2,400,000 units of Bicillin once on 01/13/2022.  Subsequent RPR testing on 04/16/2022 with titer down to 1: 4.  Increased Lamictal to 100 mg daily and continued on Seroquel at 50 mg nightly. Renal function, hepatic function, and liver function within normal ranges.  Here today for follow-up.  [name] has been doing okay since last office visit and continues to take Dovato as prescribed with no adverse side effects or problems obtaining medication from the pharmacy. Has coverage through Medicaid. Concerns about weight gain as well as feelings of being moody and losing mind at times. Does not want to get out of bed on occasion and current housing situation is contributing to this. No suicidal ideations or hallucinations. Condoms and STD testing offered. Due for HPV vaccination.   Denies fevers, chills, night sweats, headaches, changes in vision, neck pain/stiffness, nausea, diarrhea, vomiting, lesions or rashes.  No Known Allergies    Outpatient Medications Prior to Visit  Medication Sig Dispense Refill   dolutegravir-lamiVUDine (DOVATO) 50-300 MG tablet Take 1 tablet by mouth daily. 30 tablet 6   amoxicillin-clavulanate (AUGMENTIN) 875-125 MG tablet Take 1 tablet by mouth every 12 (twelve) hours. (Patient not  taking: Reported on 04/20/2022) 14 tablet 0   lamoTRIgine (LAMICTAL) 100 MG tablet Take 1 tablet (100 mg total) by mouth daily. (Patient not taking: Reported on 04/20/2022) 30 tablet 5   QUEtiapine (SEROQUEL) 50 MG tablet Take 1 tablet (50 mg total) by mouth at bedtime. (Patient not taking: Reported on 04/20/2022) 30 tablet 5   No facility-administered medications prior to visit.     Past Medical History:  Diagnosis Date   Depression 07/09/2016   History of kidney stones    History of syphilis 07/09/2016   HIV (human immunodeficiency virus infection) (HCC) 06/29/2016   Dx 06/10/2016   Kidney stone    kidney stones     Past Surgical History:  Procedure Laterality Date   FACIAL LACERATION REPAIR N/A 11/16/2020   Procedure: REPAIR OF COMPLEX  LIP LACERATION;  Surgeon: Laren Boom, DO;  Location: [facility] OR;  Service: ENT;  Laterality: N/A;   FINGER SURGERY Left    ring finger   NOSE SURGERY        Review of Systems  Constitutional:  Negative for appetite change, chills, fatigue, fever and unexpected weight change.  Eyes:  Negative for visual disturbance.  Respiratory:  Negative for cough, chest tightness, shortness of breath and wheezing.   Cardiovascular:  Negative for chest pain and leg swelling.  Gastrointestinal:  Negative for abdominal pain, constipation, diarrhea, nausea and vomiting.  Genitourinary:  Negative for dysuria, flank pain, frequency, genital sores, hematuria and urgency.  Skin:  Negative for rash.  Allergic/Immunologic: Negative for immunocompromised state.  Neurological:  Negative for  dizziness and headaches.      Objective:    BP 114/80   Pulse 84   Temp 98.3 F (36.8 C) (Oral)   Ht 5\' 10"  (1.778 m)   Wt 240 lb (108.9 kg)   SpO2 96%   BMI 34.44 kg/m  Nursing note and vital signs reviewed.  Physical Exam Constitutional:      General: He is not in acute distress.    Appearance: He is well-developed.  Eyes:     Conjunctiva/sclera: Conjunctivae  normal.  Cardiovascular:     Rate and Rhythm: Normal rate and regular rhythm.     Heart sounds: Normal heart sounds. No murmur heard.    No friction rub. No gallop.  Pulmonary:     Effort: Pulmonary effort is normal. No respiratory distress.     Breath sounds: Normal breath sounds. No wheezing or rales.  Chest:     Chest wall: No tenderness.  Abdominal:     General: Bowel sounds are normal.     Palpations: Abdomen is soft.     Tenderness: There is no abdominal tenderness.  Musculoskeletal:     Cervical back: Neck supple.  Lymphadenopathy:     Cervical: No cervical adenopathy.  Skin:    General: Skin is warm and dry.     Findings: No rash.  Neurological:     Mental Status: He is alert and oriented to person, place, and time.  Psychiatric:        Behavior: Behavior normal.        Thought Content: Thought content normal.        Judgment: Judgment normal.         06/15/2022    1:48 PM 04/20/2022    4:05 PM 01/08/2022   10:06 AM 05/22/2021    1:37 PM 01/20/2021    6:07 PM  Depression screen PHQ 2/9  Decreased Interest 0 3 0 3   Down, Depressed, Hopeless 0 3 0 3   PHQ - 2 Score 0 6 0 6   Altered sleeping  3     Tired, decreased energy  3     Change in appetite  3     Feeling bad or failure about yourself   3     Trouble concentrating  3     Moving slowly or fidgety/restless  1     Suicidal thoughts  2     PHQ-9 Score  24     Difficult doing work/chores  Very difficult        Information is confidential and restricted. Go to Review Flowsheets to unlock data.       Assessment & Plan:    Patient Active Problem List   Diagnosis Date Noted   Depression 05/23/2021   Economic hardship 05/23/2021   High risk homosexual behavior 07/18/2018   Healthcare maintenance 08/25/2016   History of syphilis 07/09/2016   Outbursts of anger 07/09/2016   HIV (human immunodeficiency virus infection) (HCC) 06/29/2016     Problem List Items Addressed This Visit       Other   HIV  (human immunodeficiency virus infection) (HCC) - Primary (Chronic)    Onyekachi continues to have well controlled virus with good adherence and tolerance to Dovato. Reviewed previous lab work and discussed plan of care and U=U. Discussed possible changing medication however weight gain is likely multifactorial given mental health medications and current nutritional intake. Check lab work. Continue current dose of Dovato. Plan for follow up in 4 months  or sooner if needed with lab work on the same day.       Relevant Medications   dolutegravir-lamiVUDine (DOVATO) 50-300 MG tablet   Other Relevant Orders   BASIC METABOLIC PANEL WITH GFR   HIV-1 RNA quant-no reflex-bld   T-helper cell (CD4)- ([facility] clinic only)   History of syphilis    Previous positive titer of 1:32 improved to 1:4. Check RPR.       Relevant Orders   RPR   Depression    Mihir's mood continues to be labile with anhedonia and mood swings that are not well controlled with current dose of lamotrigine and quetiapine. No suicidal ideation or signs of psychosis. Encouraged counseling and cognitive behavorial therapy. Will place referral to psychiatry for optimization of medications. Information about [facility] provided in AVS.       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 I am also having him maintain his Dovato, lamoTRIgine, and QUEtiapine.   Meds ordered this encounter  Medications   dolutegravir-lamiVUDine (DOVATO) 50-300 MG tablet    Sig: Take 1 tablet by mouth daily.    Dispense:  30 tablet    Refill:  6    Order Specific Question:    Supervising Provider    Answer:   Drue Second, CYNTHIA [[id number]]   lamoTRIgine (LAMICTAL) 100 MG tablet    Sig: Take 1 tablet (100 mg total) by mouth daily.    Dispense:  30 tablet    Refill:  5    Order Specific Question:   Supervising Provider    Answer:   Drue Second, CYNTHIA [[id number]]   QUEtiapine (SEROQUEL) 50 MG tablet    Sig: Take 1 tablet (50 mg total) by mouth at bedtime.    Dispense:  30 tablet    Refill:  5    Order Specific Question:   Supervising Provider    Answer:   Judyann Munson [[id number]]     Follow-up: Return in about 4 months (around 10/15/2022), or if symptoms worsen or fail to improve.   [name], MSN, FNP-C Nurse Practitioner [facility] for Infectious Disease [facility] Main number: [phone]

## 2022-06-15 NOTE — Assessment & Plan Note (Signed)
Previous positive titer of 1:32 improved to 1:4. Check RPR.

## 2022-06-16 ENCOUNTER — Encounter: Payer: Self-pay | Admitting: Pharmacist

## 2022-06-16 ENCOUNTER — Other Ambulatory Visit (HOSPITAL_COMMUNITY): Payer: Self-pay

## 2022-06-16 ENCOUNTER — Other Ambulatory Visit: Payer: Self-pay

## 2022-06-16 LAB — BASIC METABOLIC PANEL WITH GFR
Creat: 0.89 mg/dL (ref 0.60–1.24)
Potassium: 4.1 mmol/L (ref 3.5–5.3)
Sodium: 140 mmol/L (ref 135–146)

## 2022-06-16 LAB — T-HELPER CELL (CD4) - (RCID CLINIC ONLY)
CD4 % Helper T Cell: 33 % (ref 33–65)
CD4 T Cell Abs: 909 /uL (ref 400–1790)

## 2022-06-16 LAB — HEPATITIS B SURFACE ANTIBODY, QUANTITATIVE: Hep B S AB Quant (Post): 248 m[IU]/mL (ref >=10)

## 2022-06-16 LAB — RPR: RPR Ser Ql: REACTIVE — AB

## 2022-06-18 ENCOUNTER — Other Ambulatory Visit: Payer: Self-pay

## 2022-06-18 ENCOUNTER — Other Ambulatory Visit (HOSPITAL_COMMUNITY): Payer: Self-pay

## 2022-06-18 LAB — HIV-1 RNA QUANT-NO REFLEX-BLD
HIV 1 RNA Quant: NOT DETECTED Copies/mL
HIV-1 RNA Quant, Log: NOT DETECTED Log cps/mL

## 2022-06-18 LAB — T PALLIDUM AB: T Pallidum Abs: POSITIVE — AB

## 2022-06-18 LAB — BASIC METABOLIC PANEL WITH GFR
BUN: 13 mg/dL (ref 7–25)
CO2: 25 mmol/L (ref 20–32)
Calcium: 9.7 mg/dL (ref 8.6–10.3)
Chloride: 107 mmol/L (ref 98–110)
Glucose, Bld: 100 mg/dL — ABNORMAL HIGH (ref 65–99)
eGFR: 119 mL/min/{1.73_m2} (ref >=60)

## 2022-06-18 LAB — RPR TITER: RPR Titer: 1:4 {titer} — ABNORMAL HIGH

## 2022-06-19 ENCOUNTER — Other Ambulatory Visit (HOSPITAL_COMMUNITY): Payer: Self-pay

## 2022-06-30 ENCOUNTER — Other Ambulatory Visit (HOSPITAL_COMMUNITY): Payer: Self-pay

## 2022-07-03 ENCOUNTER — Other Ambulatory Visit (HOSPITAL_COMMUNITY): Payer: Self-pay

## 2022-07-03 ENCOUNTER — Telehealth: Payer: Self-pay

## 2022-07-03 NOTE — Telephone Encounter (Signed)
Called [employer] to initiate PA for Quetiapine Fumarate 50 mg tab. PA approved 07/03/22-06/28/23 Confirmation number: [id number] [facility] Tracks [phone] Reference #: Tresa Endo- [id number] Juanita Laster, RMA

## 2022-07-08 ENCOUNTER — Other Ambulatory Visit (HOSPITAL_COMMUNITY): Payer: Self-pay

## 2022-07-10 ENCOUNTER — Other Ambulatory Visit (HOSPITAL_COMMUNITY): Payer: Self-pay

## 2022-07-13 ENCOUNTER — Encounter (HOSPITAL_COMMUNITY): Payer: Self-pay

## 2022-07-13 ENCOUNTER — Other Ambulatory Visit (HOSPITAL_COMMUNITY): Payer: Self-pay

## 2022-07-21 ENCOUNTER — Encounter: Payer: Self-pay | Admitting: Pharmacy Technician

## 2022-07-28 ENCOUNTER — Other Ambulatory Visit (HOSPITAL_COMMUNITY): Payer: Self-pay

## 2022-08-20 ENCOUNTER — Ambulatory Visit (HOSPITAL_COMMUNITY)
Admission: EM | Admit: 2022-08-20 | Discharge: 2022-08-20 | Disposition: A | Discharge status: Home / Self Care | Payer: MEDICAID | Attending: Internal Medicine | Admitting: Internal Medicine

## 2022-08-20 ENCOUNTER — Encounter (HOSPITAL_COMMUNITY): Payer: Self-pay | Admitting: *Deleted

## 2022-08-20 DIAGNOSIS — H1031 Unspecified acute conjunctivitis, right eye: Secondary | ICD-10-CM | POA: Diagnosis not present

## 2022-08-20 DIAGNOSIS — H5789 Other specified disorders of eye and adnexa: Secondary | ICD-10-CM | POA: Diagnosis not present

## 2022-08-20 MED ORDER — ERYTHROMYCIN 5 MG/GM OP OINT: TOPICAL_OINTMENT | OPHTHALMIC | 0 refills | Status: DC

## 2022-08-20 NOTE — ED Triage Notes (Signed)
Pt states right eye has been bothering him for about a month. He has been using clear eyes without any relief. He states he is seeing floaters and he is having a lot of pain.

## 2022-08-20 NOTE — ED Provider Notes (Signed)
[facility]    CSN: [id number] Arrival date & time: 08/20/22  0848      History   Chief Complaint Chief Complaint  Patient presents with   Eye Problem    HPI Steven [name] is a 32 y.o. male.   Patient presents with right thigh pain for several weeks.  Patient states that her grandmother and sister both had pinkeye and he has very similar symptoms.  The pain has been getting worse with thick drainage.  Patient has been using clear eyedrops with no relief.  Patient does wear glasses no contacts denies any injury noted to the eye.     Past Medical History:  Diagnosis Date   Depression 07/09/2016   History of kidney stones    History of syphilis 07/09/2016   HIV (human immunodeficiency virus infection) (HCC) 06/29/2016   Dx 06/10/2016   Kidney stone    kidney stones    Patient Active Problem List   Diagnosis Date Noted   Depression 05/23/2021   Economic hardship 05/23/2021   High risk homosexual behavior 07/18/2018   Healthcare maintenance 08/25/2016   History of syphilis 07/09/2016   Outbursts of anger 07/09/2016   HIV (human immunodeficiency virus infection) (HCC) 06/29/2016    Past Surgical History:  Procedure Laterality Date   FACIAL LACERATION REPAIR N/A 11/16/2020   Procedure: REPAIR OF COMPLEX  LIP LACERATION;  Surgeon: Laren Boom, DO;  Location: [facility] OR;  Service: ENT;  Laterality: N/A;   FINGER SURGERY Left    ring finger   NOSE SURGERY         Home Medications    Prior to Admission medications   Medication Sig Start Date End Date Taking? Authorizing Provider  dolutegravir-lamiVUDine (DOVATO) 50-300 MG tablet Take 1 tablet by mouth daily. 06/15/22  Yes Veryl Speak, FNP  erythromycin ophthalmic ointment Place a 1/2 inch ribbon of ointment into the lower eyelid. 08/20/22  Yes Coralyn Mark, NP  lamoTRIgine (LAMICTAL) 100 MG tablet Take 1 tablet (100 mg total) by mouth daily. 06/15/22  Yes Veryl Speak, FNP  QUEtiapine  (SEROQUEL) 50 MG tablet Take 1 tablet (50 mg total) by mouth at bedtime. 06/15/22  Yes Veryl Speak, FNP    Family History Family History  Problem Relation Age of Onset   Mental illness Mother     Social History Social History   Tobacco Use   Smoking status: Former    Types: Cigars   Smokeless tobacco: Never  Vaping Use   Vaping status: Every Day   Substances: Nicotine  Substance Use Topics   Alcohol use: Not Currently    Comment: socially   Drug use: Yes    Types: Marijuana     Allergies   Patient has no known allergies.   Review of Systems Review of Systems  Constitutional:  Negative for chills and fever.  HENT:  Negative for congestion.   Eyes:  Positive for photophobia, pain, discharge, redness and itching. Negative for visual disturbance.  Respiratory: Negative.    Cardiovascular: Negative.      Physical Exam Triage Vital Signs ED Triage Vitals  Encounter Vitals Group     BP 08/20/22 0937 129/84     Systolic BP Percentile --      Diastolic BP Percentile --      Pulse Rate 08/20/22 0937 67     Resp 08/20/22 0937 18     Temp 08/20/22 0937 98.3 F (36.8 C)     Temp Source  08/20/22 0937 Oral     SpO2 08/20/22 0937 96 %     Weight --      Height --      Head Circumference --      Peak Flow --      Pain Score 08/20/22 0936 7     Pain Loc --      Pain Education --      Exclude from Growth Chart --    No data found.  Updated Vital Signs BP 129/84 (BP Location: Left Arm)   Pulse 67   Temp 98.3 F (36.8 C) (Oral)   Resp 18   SpO2 96%   Visual Acuity Right Eye Distance: 20/70 Left Eye Distance: 20/50 Bilateral Distance: 20/50  Right Eye Near:   Left Eye Near:    Bilateral Near:     Physical Exam Constitutional:      Appearance: Normal appearance.  HENT:     Right Ear: Tympanic membrane normal.     Left Ear: Tympanic membrane normal.     Nose: Nose normal.  Eyes:     General:        Right eye: Discharge present. No foreign body.         Left eye: Discharge present.No foreign body.     Extraocular Movements: Extraocular movements intact.     Comments: Erythema noted to right conjunctiva with thick discharge slight edema noted to surrounding lower lid  Cardiovascular:     Rate and Rhythm: Normal rate.  Neurological:     Mental Status: He is alert.      UC Treatments / Results  Labs (all labs ordered are listed, but only abnormal results are displayed) Labs Reviewed - No data to display  EKG   Radiology No results found.  Procedures Procedures (including critical care time)  Medications Ordered in UC Medications - No data to display  Initial Impression / Assessment and Plan / UC Course  I have reviewed the triage vital signs and the nursing notes.  Pertinent labs & imaging results that were available during my care of the patient were reviewed by me and considered in my medical decision making (see chart for details).     Use warm compresses Use ointment as prescribed Wash hands continuously to prevent spreading If symptoms become worse you will need to return for further evaluation Final Clinical Impressions(s) / UC Diagnoses   Final diagnoses:  Acute bacterial conjunctivitis of right eye  Eye irritation     Discharge Instructions      Use warm compresses Use ointment as prescribed Wash hands continuously to prevent spreading If symptoms become worse you will need to return for further evaluation     ED Prescriptions     Medication Sig Dispense Auth. Provider   erythromycin ophthalmic ointment Place a 1/2 inch ribbon of ointment into the lower eyelid. 3.5 g Coralyn Mark, NP      PDMP not reviewed this encounter.   Coralyn Mark, NP 08/20/22 1009

## 2022-08-20 NOTE — Discharge Instructions (Signed)
Use warm compresses Use ointment as prescribed Wash hands continuously to prevent spreading If symptoms become worse you will need to return for further evaluation

## 2022-10-14 ENCOUNTER — Ambulatory Visit: Payer: Medicaid Other | Admitting: Family

## 2022-10-16 ENCOUNTER — Ambulatory Visit (INDEPENDENT_AMBULATORY_CARE_PROVIDER_SITE_OTHER): Payer: MEDICAID | Admitting: Family

## 2022-10-16 ENCOUNTER — Other Ambulatory Visit: Payer: Self-pay

## 2022-10-16 ENCOUNTER — Encounter: Payer: Self-pay | Admitting: Family

## 2022-10-16 VITALS — BP 108/73 | HR 74 | Temp 97.9°F | Ht 70.0 in | Wt 247.0 lb

## 2022-10-16 DIAGNOSIS — Z599 Problem related to housing and economic circumstances, unspecified: Secondary | ICD-10-CM

## 2022-10-16 DIAGNOSIS — Z113 Encounter for screening for infections with a predominantly sexual mode of transmission: Secondary | ICD-10-CM

## 2022-10-16 DIAGNOSIS — Z23 Encounter for immunization: Secondary | ICD-10-CM | POA: Diagnosis not present

## 2022-10-16 DIAGNOSIS — Z21 Asymptomatic human immunodeficiency virus [HIV] infection status: Secondary | ICD-10-CM

## 2022-10-16 DIAGNOSIS — F3341 Major depressive disorder, recurrent, in partial remission: Secondary | ICD-10-CM | POA: Diagnosis not present

## 2022-10-16 DIAGNOSIS — Z0000 Encounter for general adult medical examination without abnormal findings: Secondary | ICD-10-CM

## 2022-10-16 MED ORDER — QUETIAPINE FUMARATE 50 MG PO TABS
50.0000 mg | ORAL_TABLET | Freq: Every day | ORAL | 5 refills | Status: AC
  Filled 2022-10-16: qty 30, 30d supply, fill #0
  Filled 2022-12-29 – 2023-01-08 (×2): qty 30, 30d supply, fill #1

## 2022-10-16 MED ORDER — DOVATO 50-300 MG PO TABS
1.0000 | ORAL_TABLET | Freq: Every day | ORAL | 6 refills | Status: DC
Start: 2022-10-16 — End: 2023-05-06
  Filled 2022-10-19: qty 30, 30d supply, fill #0
  Filled 2022-12-07: qty 30, 30d supply, fill #1
  Filled 2022-12-29: qty 30, 30d supply, fill #2

## 2022-10-16 MED ORDER — LAMOTRIGINE 100 MG PO TABS
100.0000 mg | ORAL_TABLET | Freq: Every day | ORAL | 5 refills | Status: AC
  Filled 2022-10-16: qty 30, 30d supply, fill #0
  Filled 2022-12-29 – 2023-01-08 (×2): qty 30, 30d supply, fill #1

## 2022-10-16 NOTE — Progress Notes (Signed)
Specialty Pharmacy Initiation Note   Steven [name] is a 32 y.o. male who will be followed by the specialty pharmacy service for RxSp HIV    Review of administration, indication, effectiveness, safety, potential side effects, storage/disposable, and missed dose instructions occurred today for patient's specialty medication(s) Dolutegravir-Lamivudine     Patient/Caregiver did not have any additional questions or concerns.   Patient's therapy is appropriate to: Initiate    Goals Addressed             This Visit's Progress    Achieve Undetectable HIV Viral Load < 20       Patient is initiating therapy. Patient will work on increased adherence.      Comply with lab assessments       Patient is initiating therapy. Patient will adhere to provider and/or lab appointments.      Improve or maintain quality of life       Patient is initiating therapy. Patient will be monitored by provider to determine if a change in treatment plan is warranted.        Galya Dunnigan L. Edythe Riches, PharmD, BCIDP, AAHIVP, CPP Clinical Pharmacist Practitioner Infectious Diseases Clinical Pharmacist [facility] 10/16/2022, 1:38 PM

## 2022-10-16 NOTE — Assessment & Plan Note (Signed)
Triamine continues to have mood swings and issues with easily being agitated combined with decreased ability to sleep. Less than optimal adherence to his lamotrigine and quetiapine taking them more on an "as needed" basis. Discussed importance of taking medication regularly to help stabilize his mood and sleep. Pill box provided to help with reminding to take medication. Information for counseling placed in AVS. Has been unable to be reached when attempting to schedule psyhiatry appointment. Will plan to follow up in 1 month or sooner if needed.

## 2022-10-16 NOTE — Patient Instructions (Addendum)
Nice to see you.  We will check your lab work today.  Continue to take your medication daily as prescribed.  Please call [facility]) to schedule/follow up on your dental care at [phone]  Refills have been sent to the pharmacy.  Plan for follow up in 1 months or sooner if needed with lab work on the same day.  Have a great day and stay safe!   [facility] [phone] Crisis Line [phone]

## 2022-10-16 NOTE — Assessment & Plan Note (Signed)
Rodricus is working full time and has moved into a new place. Access to food is stable.

## 2022-10-16 NOTE — Progress Notes (Signed)
Brief Narrative   Patient ID: Steven [name], male    DOB: [birth year], 32 y.o.   MRN: [id number]  [name] is a 32 y/o AA male with HIV disease diagnosed in June 2018. Genotype without significant resistance. Initial CD4 count was 280 and initial viral load of 55,300. No history of opportunistic infection. No previous treatment regimens prior to [place], [place].   Subjective:    Chief Complaint  Patient presents with   Follow-up    B20    HPI:  Steven [name] is a 32 y.o. male with HIV disease last seen on 06/15/22 with well controlled virus and good adherence and tolerance to Dovato. Viral load was undetectable and CD4 count 909. Kidney function and electrolytes within normal ranges. RPR titer stable at 1:4 down from treatment at 1:32 on 01/08/22. Mood remained labile with medication regimen with referral to Psychology. Here today for follow up.  Steven [name] has been doing okay since last office and continues to take Dovato as prescribed with no adverse side effects or problems obtaining medication from the pharmacy. Mood has remained labile and feels like is irritated easily and is not sleeping well. No suicidal ideations or auditory/visual hallucinations. Has only been taking his lamotrigine and quetiapine on occasion. Notices improvements when he does. Condoms and STD testing offered. Vaccinations reviewed.   Denies fevers, chills, night sweats, headaches, changes in vision, neck pain/stiffness, nausea, diarrhea, vomiting, lesions or rashes.  Lab Results  Component Value Date   CD4TCELL 33 06/15/2022   CD4TABS 909 06/15/2022   Lab Results  Component Value Date   HIV1RNAQUANT Not Detected 06/15/2022     No Known Allergies    Outpatient Medications Prior to Visit  Medication Sig Dispense Refill   dolutegravir-lamiVUDine (DOVATO) 50-300 MG tablet Take 1 tablet by mouth daily. 30 tablet 6   erythromycin ophthalmic ointment Place a 1/2 inch ribbon of ointment into the lower  eyelid. (Patient not taking: Reported on 10/16/2022) 3.5 g 0   lamoTRIgine (LAMICTAL) 100 MG tablet Take 1 tablet (100 mg total) by mouth daily. (Patient not taking: Reported on 10/16/2022) 30 tablet 5   QUEtiapine (SEROQUEL) 50 MG tablet Take 1 tablet (50 mg total) by mouth at bedtime. (Patient not taking: Reported on 10/16/2022) 30 tablet 5   No facility-administered medications prior to visit.     Past Medical History:  Diagnosis Date   Depression 07/09/2016   History of kidney stones    History of syphilis 07/09/2016   HIV (human immunodeficiency virus infection) (HCC) 06/29/2016   Dx 06/10/2016   Kidney stone    kidney stones     Past Surgical History:  Procedure Laterality Date   FACIAL LACERATION REPAIR N/A 11/16/2020   Procedure: REPAIR OF COMPLEX  LIP LACERATION;  Surgeon: Laren Boom, DO;  Location: [facility];  Service: ENT;  Laterality: N/A;   FINGER SURGERY Left    ring finger   NOSE SURGERY        Review of Systems  Constitutional:  Negative for appetite change, chills, fatigue, fever and unexpected weight change.  Eyes:  Negative for visual disturbance.  Respiratory:  Negative for cough, chest tightness, shortness of breath and wheezing.   Cardiovascular:  Negative for chest pain and leg swelling.  Gastrointestinal:  Negative for abdominal pain, constipation, diarrhea, nausea and vomiting.  Genitourinary:  Negative for dysuria, flank pain, frequency, genital sores, hematuria and urgency.  Skin:  Negative for rash.  Allergic/Immunologic: Negative for immunocompromised state.  Neurological:  Negative for dizziness and headaches.  Psychiatric/Behavioral:  Positive for agitation and sleep disturbance. Negative for confusion, decreased concentration, hallucinations, self-injury and suicidal ideas. The patient is not hyperactive.       Objective:    BP 108/73   Pulse 74   Temp 97.9 F (36.6 C) (Temporal)   Ht 5\' 10"  (1.778 m)   Wt 247 lb (112 kg)   SpO2  97%   BMI 35.44 kg/m  Nursing note and vital signs reviewed.  Physical Exam Constitutional:      General: He is not in acute distress.    Appearance: He is well-developed.  Eyes:     Conjunctiva/sclera: Conjunctivae normal.  Cardiovascular:     Rate and Rhythm: Normal rate and regular rhythm.     Heart sounds: Normal heart sounds. No murmur heard.    No friction rub. No gallop.  Pulmonary:     Effort: Pulmonary effort is normal. No respiratory distress.     Breath sounds: Normal breath sounds. No wheezing or rales.  Chest:     Chest wall: No tenderness.  Abdominal:     General: Bowel sounds are normal.     Palpations: Abdomen is soft.     Tenderness: There is no abdominal tenderness.  Musculoskeletal:     Cervical back: Neck supple.  Lymphadenopathy:     Cervical: No cervical adenopathy.  Skin:    General: Skin is warm and dry.     Findings: No rash.  Neurological:     Mental Status: He is alert and oriented to person, place, and time.  Psychiatric:        Behavior: Behavior normal.        Thought Content: Thought content normal.        Judgment: Judgment normal.         10/16/2022    9:40 AM 06/15/2022    1:48 PM 04/20/2022    4:05 PM 01/08/2022   10:06 AM 05/22/2021    1:37 PM  Depression screen PHQ 2/9  Decreased Interest 0 0 3 0 3  Down, Depressed, Hopeless 1 0 3 0 3  PHQ - 2 Score 1 0 6 0 6  Altered sleeping   3    Tired, decreased energy   3    Change in appetite   3    Feeling bad or failure about yourself    3    Trouble concentrating   3    Moving slowly or fidgety/restless   1    Suicidal thoughts   2    PHQ-9 Score   24    Difficult doing work/chores   Very difficult         Assessment & Plan:    Patient Active Problem List   Diagnosis Date Noted   Depression 05/23/2021   Economic hardship 05/23/2021   High risk homosexual behavior 07/18/2018   Healthcare maintenance 08/25/2016   History of syphilis 07/09/2016   Outbursts of anger  07/09/2016   HIV (human immunodeficiency virus infection) (HCC) 06/29/2016     Problem List Items Addressed This Visit       Other   HIV (human immunodeficiency virus infection) (HCC) - Primary (Chronic)    Steven [name] continues to have well controlled virus with good adherence and tolerance to Dovato.  Reviewed lab work and discussed plan of care, U equals U, and family planning. Check lab work. Continue current dose of Dovato. Plan for follow up in  1 month or sooner  if needed with lab work on the same day.       Relevant Medications   dolutegravir-lamiVUDine (DOVATO) 50-300 MG tablet   Other Relevant Orders   COMPLETE METABOLIC PANEL WITH GFR   HIV-1 RNA quant-no reflex-bld   T-helper cells (CD4) count (not at [facility])   Healthcare maintenance    Discussed importance of safe sexual practice and condom use. Condoms and STD testing offered.  Influenza and Covid vaccinations updated.  Due for dental care with information for appointment placed in AVS.       Depression    Steven [name] continues to have mood swings and issues with easily being agitated combined with decreased ability to sleep. Less than optimal adherence to his lamotrigine and quetiapine taking them more on an "as needed" basis. Discussed importance of taking medication regularly to help stabilize his mood and sleep. Pill box provided to help with reminding to take medication. Information for counseling placed in AVS. Has been unable to be reached when attempting to schedule psyhiatry appointment. Will plan to follow up in 1 month or sooner if needed.       Relevant Orders   Ambulatory referral to Psychiatry   Economic hardship    Steven [name] is working full time and has moved into a new place. Access to food is stable.       Other Visit Diagnoses     Screening for STDs (sexually transmitted diseases)       Relevant Orders   C. trachomatis/N. gonorrhoeae RNA   GC/CT Probe, Amp (Throat)   CT/NG RNA, TMA Rectal   Encounter  for immunization       Relevant Orders   Flu vaccine trivalent PF, 6mos and older(Flulaval,Afluria,Fluarix,Fluzone) (Completed)   Pfizer Comirnaty Covid-19 Vaccine 28yrs & older (Completed)        I am having Steven [name] maintain his erythromycin, Dovato, lamoTRIgine, and QUEtiapine.   Meds ordered this encounter  Medications   dolutegravir-lamiVUDine (DOVATO) 50-300 MG tablet    Sig: Take 1 tablet by mouth daily.    Dispense:  30 tablet    Refill:  6    Please mail    Order Specific Question:   Supervising Provider    Answer:   Judyann Munson [[id number]]   lamoTRIgine (LAMICTAL) 100 MG tablet    Sig: Take 1 tablet (100 mg total) by mouth daily.    Dispense:  30 tablet    Refill:  5    Please mail    Order Specific Question:   Supervising Provider    Answer:   Drue Second, CYNTHIA [[id number]]   QUEtiapine (SEROQUEL) 50 MG tablet    Sig: Take 1 tablet (50 mg total) by mouth at bedtime.    Dispense:  30 tablet    Refill:  5    Please mail    Order Specific Question:   Supervising Provider    Answer:   Judyann Munson [[id number]]     Follow-up: Return in about 1 month (around 11/16/2022), or if symptoms worsen or fail to improve. or sooner if needed.    [name], MSN, FNP-C Nurse Practitioner [facility] for Infectious Disease [facility] Main number: [phone]

## 2022-10-16 NOTE — Assessment & Plan Note (Signed)
Discussed importance of safe sexual practice and condom use. Condoms and STD testing offered.  Influenza and Covid vaccinations updated.  Due for dental care with information for appointment placed in AVS.

## 2022-10-16 NOTE — Assessment & Plan Note (Signed)
Steven [name] continues to have well controlled virus with good adherence and tolerance to Dovato.  Reviewed lab work and discussed plan of care, U equals U, and family planning. Check lab work. Continue current dose of Dovato. Plan for follow up in  1 month or sooner if needed with lab work on the same day.

## 2022-10-17 LAB — GC/CHLAMYDIA PROBE, AMP (THROAT)
Chlamydia trachomatis RNA: NOT DETECTED
Neisseria gonorrhoeae RNA: NOT DETECTED

## 2022-10-17 LAB — CT/NG RNA, TMA RECTAL
Chlamydia Trachomatis RNA: DETECTED — AB
Neisseria Gonorrhoeae RNA: NOT DETECTED

## 2022-10-17 LAB — C. TRACHOMATIS/N. GONORRHOEAE RNA
C. trachomatis RNA, TMA: NOT DETECTED
N. gonorrhoeae RNA, TMA: NOT DETECTED

## 2022-10-19 ENCOUNTER — Other Ambulatory Visit (HOSPITAL_COMMUNITY): Payer: Self-pay

## 2022-10-19 ENCOUNTER — Other Ambulatory Visit: Payer: Self-pay

## 2022-10-19 LAB — COMPLETE METABOLIC PANEL WITH GFR
AG Ratio: 1.9 (calc) (ref 1.0–2.5)
ALT: 21 U/L (ref 9–46)
AST: 16 U/L (ref 10–40)
Albumin: 4.1 g/dL (ref 3.6–5.1)
Alkaline phosphatase (APISO): 58 U/L (ref 36–130)
BUN: 12 mg/dL (ref 7–25)
CO2: 29 mmol/L (ref 20–32)
Calcium: 9.1 mg/dL (ref 8.6–10.3)
Chloride: 107 mmol/L (ref 98–110)
Creat: 0.99 mg/dL (ref 0.60–1.26)
Globulin: 2.2 g/dL (ref 1.9–3.7)
Glucose, Bld: 88 mg/dL (ref 65–99)
Potassium: 4 mmol/L (ref 3.5–5.3)
Sodium: 142 mmol/L (ref 135–146)
Total Bilirubin: 0.3 mg/dL (ref 0.2–1.2)
Total Protein: 6.3 g/dL (ref 6.1–8.1)
eGFR: 105 mL/min/{1.73_m2} (ref >=60)

## 2022-10-19 LAB — HIV-1 RNA QUANT-NO REFLEX-BLD
HIV 1 RNA Quant: <20 {copies}/mL — ABNORMAL HIGH
HIV-1 RNA Quant, Log: <1.3 {Log} — ABNORMAL HIGH

## 2022-10-19 LAB — T-HELPER CELLS (CD4) COUNT (NOT AT ARMC)
Absolute CD4: 1025 {cells}/uL (ref 490–1740)
CD4 T Helper %: 36 % (ref 30–61)
Total lymphocyte count: 2826 {cells}/uL (ref 850–3900)

## 2022-10-19 NOTE — Progress Notes (Signed)
Specialty Pharmacy Initial Fill Coordination Note  Steven [name] is a 32 y.o. male contacted today regarding refills of specialty medication(s) Dolutegravir-Lamivudine   Patient requested Delivery   Delivery date: 10/20/22   Verified address: [address] [place] [address]   Medication will be filled on 10/19/22.   Patient is aware of $0 copayment.

## 2022-10-20 ENCOUNTER — Other Ambulatory Visit: Payer: Self-pay | Admitting: Family

## 2022-10-20 ENCOUNTER — Telehealth: Payer: Self-pay

## 2022-10-20 ENCOUNTER — Other Ambulatory Visit (HOSPITAL_COMMUNITY): Payer: Self-pay

## 2022-10-20 ENCOUNTER — Other Ambulatory Visit: Payer: Self-pay

## 2022-10-20 MED ORDER — DOXYCYCLINE HYCLATE 100 MG PO TABS
100.0000 mg | ORAL_TABLET | Freq: Two times a day (BID) | ORAL | 0 refills | Status: DC
  Filled 2022-10-20 – 2022-12-02 (×2): qty 14, 7d supply, fill #0

## 2022-10-20 NOTE — Telephone Encounter (Signed)
-----   Message from Jeanine Luz sent at 10/20/2022  3:36 PM EDT ----- Please inform Steven [name] that his lab work is positive for chlamydia and I have sent in a prescription for doxycycline for him to take.

## 2022-10-20 NOTE — Telephone Encounter (Signed)
Patient aware and will pick up rx for doxycycline. Informed patient to abstain from sex now and 7 to 10 days after completing doxycycline and to inform any sexual partners that they need to be tested and treated as well. Patient voiced his understanding.     Steven [name] Lesli Albee, CMA

## 2022-10-21 ENCOUNTER — Other Ambulatory Visit: Payer: Self-pay

## 2022-10-21 ENCOUNTER — Other Ambulatory Visit (HOSPITAL_COMMUNITY): Payer: Self-pay

## 2022-10-21 ENCOUNTER — Encounter (HOSPITAL_COMMUNITY): Payer: Self-pay | Admitting: Pharmacist

## 2022-10-24 ENCOUNTER — Other Ambulatory Visit (HOSPITAL_COMMUNITY): Payer: Self-pay

## 2022-10-26 ENCOUNTER — Other Ambulatory Visit: Payer: Self-pay

## 2022-11-06 ENCOUNTER — Other Ambulatory Visit: Payer: Self-pay

## 2022-11-09 ENCOUNTER — Other Ambulatory Visit: Payer: Self-pay

## 2022-11-11 ENCOUNTER — Other Ambulatory Visit: Payer: Self-pay

## 2022-11-30 ENCOUNTER — Ambulatory Visit: Payer: MEDICAID | Admitting: Family

## 2022-12-02 ENCOUNTER — Other Ambulatory Visit: Payer: Self-pay

## 2022-12-02 ENCOUNTER — Other Ambulatory Visit (HOSPITAL_COMMUNITY): Payer: Self-pay

## 2022-12-07 ENCOUNTER — Other Ambulatory Visit (HOSPITAL_COMMUNITY): Payer: Self-pay

## 2022-12-07 ENCOUNTER — Other Ambulatory Visit: Payer: Self-pay

## 2022-12-07 NOTE — Progress Notes (Signed)
Specialty Pharmacy Refill Coordination Note  Steven [name] is a 32 y.o. male contacted today regarding refills of specialty medication(s) Dolutegravir-Lamivudine   Patient requested Delivery   Delivery date: 12/15/22   Verified address: [address] [name] [address]   Medication will be filled on 12/14/22.

## 2022-12-14 ENCOUNTER — Other Ambulatory Visit: Payer: Self-pay

## 2022-12-14 NOTE — Progress Notes (Unsigned)
Psychiatric Initial Adult Assessment  Patient Identification: Steven [name] MRN:  [id number] Date of Evaluation:  12/14/2022 Referral Source: ***  Assessment:  Steven [name] is a 32 y.o. male with a history of *** who presents in person to [facility] for initial evaluation of ***.  Patient reports ***  Plan:  # *** Past medication trials:  Status of problem: *** Interventions: -- ***  # *** Past medication trials:  Status of problem: *** Interventions: -- ***  # *** Past medication trials:  Status of problem: *** Interventions: -- ***  Patient was given contact information for behavioral health clinic and was instructed to call 911 for emergencies.   Subjective:  Chief Complaint: No chief complaint on file.   History of Present Illness:    Steven [name] is a 32 y.o. male with a PPHx of *** who [facility] ***. He*** is currently taking ***.   ***  Psychiatric ROS Mood Symptoms Persistent sadness or low mood***; loss of interest or pleasure in activities (anhedonia)***; significant weight change or appetite disturbance***; sleep disturbances-number of hours: *** difficulty falling asleep***, difficulty staying asleep***; loss of energy***; feelings of hopelessness*** or excessive guilt***; difficulty concentrating or making decisions***; recurrent thoughts of death or suicide***  Onset: *** Timeline: *** SI: *** Contracts for safety: ***  History of violence: *** HI: ***  Anxiety Symptoms Generalized*** anxiety, rating it ***/10. Anxiety has been occurring for ***. Reporting restlessness***, irritability***, muscle tension*** due to the anxiety. Panic attacks: ***. Frequency: ***. Most recently ***. Description of panic attack: ***  Manic Symptoms Reports multiple days of consistently elevated mood*** and/or*** energy*** outside the context of any substance use; distractibility***; impulse behavior -***;  grandiosity***; flight of ideas***; increased activity-***, minimal sleep***: number of hours-***; increased amount of speech  Frequency: *** Most recent episode: ***  Psychosis Symptoms Paranoia ***; AVH***; disorganized speech***; grossly disorganized or abnormal motor behavior***; negative symptoms (diminished emotional expression, avolition, alogia, anhedonia, asociality).  Trauma Symptoms Exposure to abuse: *** Type of abuse: *** Age trauma occurred: ***  Hypervigilance***; intrusive symptoms (e.g., flashbacks, distressing memories, or dreams)***; avoidance of stimuli associated with the trauma***; negative alterations in cognitions and mood***     Past Psychiatric History:  Diagnoses: *** Medication trials: Seroquel and Lamictal *** Previous psychiatrist/therapist: *** Hospitalizations: *** Suicide attempts: *** SIB: *** Hx of violence towards others: *** Current access to guns: *** Hx of trauma/abuse: ***  Family Psychiatric History: ***  Social History:   Living: *** School: *** Job: *** Married/Children: *** Support: *** Legal History: ***  Smoking: *** Alcohol: *** Illicit drugs: ***  Substance Abuse History in the last 12 months:  {yes no:314532}  Past Medical History:  Past Medical History:  Diagnosis Date   Depression 07/09/2016   History of kidney stones    History of syphilis 07/09/2016   HIV (human immunodeficiency virus infection) (HCC) 06/29/2016   Dx 06/10/2016   Kidney stone    kidney stones    Past Surgical History:  Procedure Laterality Date   FACIAL LACERATION REPAIR N/A 11/16/2020   Procedure: REPAIR OF COMPLEX  LIP LACERATION;  Surgeon: Laren Boom, DO;  Location: [facility] OR;  Service: ENT;  Laterality: N/A;   FINGER SURGERY Left    ring finger   NOSE SURGERY      Family History:  Family History  Problem Relation Age of Onset   Mental illness Mother     Social History   Socioeconomic History  Marital status: Single     Spouse name: Not on file   Number of children: Not on file   Years of education: Not on file   Highest education level: Not on file  Occupational History   Not on file  Tobacco Use   Smoking status: Former    Types: Cigars   Smokeless tobacco: Never  Vaping Use   Vaping status: Every Day   Substances: Nicotine  Substance and Sexual Activity   Alcohol use: Not Currently    Comment: socially   Drug use: Yes    Types: Marijuana   Sexual activity: Not Currently    Partners: Male    Birth control/protection: Condom    Comment: declined condoms  Other Topics Concern   Not on file  Social History Narrative   Not on file   Social Determinants of Health   Financial Resource Strain: Not on file  Food Insecurity: Not on file  Transportation Needs: Not on file  Physical Activity: Not on file  Stress: Not on file  Social Connections: Not on file    Allergies:  No Known Allergies  Current Medications: Current Outpatient Medications  Medication Sig Dispense Refill   dolutegravir-lamiVUDine (DOVATO) 50-300 MG tablet Take 1 tablet by mouth daily. 30 tablet 6   doxycycline (VIBRA-TABS) 100 MG tablet Take 1 tablet (100 mg total) by mouth 2 (two) times daily. 14 tablet 0   erythromycin ophthalmic ointment Place a 1/2 inch ribbon of ointment into the lower eyelid. (Patient not taking: Reported on 10/16/2022) 3.5 g 0   lamoTRIgine (LAMICTAL) 100 MG tablet Take 1 tablet (100 mg total) by mouth daily. 30 tablet 5   QUEtiapine (SEROQUEL) 50 MG tablet Take 1 tablet (50 mg total) by mouth at bedtime. 30 tablet 5   No current facility-administered medications for this visit.    Objective:  Psychiatric Specialty Exam:  There were no vitals taken for this visit.There is no height or weight on file to calculate BMI.   General Appearance: appears at stated age, casually dressed and groomed ***  Behavior: pleasant and cooperative ***  Psychomotor Activity: no psychomotor agitation or  retardation noted ***  Eye Contact: fair *** Speech: normal amount, tone, volume and fluency ***   Mood: euthymic *** Affect: congruent, pleasant and interactive ***  Thought Process: linear, goal directed, no circumstantial or tangential thought process noted, no racing thoughts or flight of ideas *** Descriptions of Associations: intact ***  Thought Content Hallucinations: denies AH, VH , does not appear responding to stimuli *** Delusions: no paranoia, delusions of control, grandeur, ideas of reference, thought broadcasting, and magical thinking *** Suicidal Thoughts: denies SI, intention, plan *** Homicidal Thoughts: denies HI, intention, plan ***  Alertness/Orientation: alert and fully oriented ***  Insight: fair*** Judgment: fair***  Memory: intact ***  Executive Functions  Concentration: intact *** Attention Span: fair *** Recall: intact *** Fund of Knowledge: fair ***  PE: General: well-appearing; no acute distress *** Pulm: no increased work of breathing on room air *** Strength & Muscle Tone: {desc; muscle tone:32375} Neuro: no focal neurological deficits observed *** Gait & Station: {PE GAIT ED NATL:22525}  ROS: No reported symptoms***  Metabolic Disorder Labs: No results found for: "HGBA1C", "MPG" No results found for: "PROLACTIN" Lab Results  Component Value Date   CHOL 146 01/08/2022   TRIG 53 01/08/2022   HDL 72 01/08/2022   CHOLHDL 2.0 01/08/2022   VLDL 6 07/09/2016   LDLCALC 61 01/08/2022   LDLCALC  74 01/10/2021   No results found for: "TSH"  Therapeutic Level Labs: No results found for: "LITHIUM" No results found for: "CBMZ" No results found for: "VALPROATE"  Screenings:  GAD-7    Flowsheet Row Office Visit from 04/20/2022 in [facility] Office Visit from 08/05/2016 in [facility]  Total GAD-7 Score 20 14      PHQ2-9    Flowsheet Row Office Visit from 10/16/2022 in [facility]  - A Dept Of [place] Office Visit from 06/15/2022 in [facility] - A Dept Of [place] Office Visit from 04/20/2022 in [facility] Office Visit from 01/08/2022 in [place] - A Dept Of [place]. [facility] Office Visit from 05/22/2021 in [facility] - A Dept Of . [facility]  PHQ-2 Total Score 1 0 6 0 6  PHQ-9 Total Score -- -- 24 -- --      Flowsheet Row ED from 08/20/2022 in [facility] ED from 04/16/2022 in [facility] ED from 11/11/2021 in [facility] RISK CATEGORY No Risk No Risk No Risk        A total of *** minutes was spent involved in face to face clinical care, chart review, and documentation.   Lance Muss, MD 12/9/20249:46 AM

## 2022-12-16 ENCOUNTER — Ambulatory Visit (HOSPITAL_COMMUNITY): Payer: MEDICAID | Admitting: Psychiatry

## 2022-12-29 ENCOUNTER — Other Ambulatory Visit: Payer: Self-pay

## 2022-12-29 NOTE — Progress Notes (Signed)
Specialty Pharmacy Refill Coordination Note  Montel Borak is a 32 y.o. male contacted today regarding refills of specialty medication(s) Dolutegravir-lamiVUDine (Dovato)   Patient requested Delivery   Delivery date: 01/08/23   Verified address: [address] [name] [address]   Medication will be filled on 01.02.25.

## 2022-12-31 ENCOUNTER — Other Ambulatory Visit: Payer: Self-pay

## 2023-01-04 ENCOUNTER — Other Ambulatory Visit: Payer: Self-pay

## 2023-01-07 ENCOUNTER — Other Ambulatory Visit: Payer: Self-pay

## 2023-01-08 ENCOUNTER — Other Ambulatory Visit: Payer: Self-pay

## 2023-01-08 ENCOUNTER — Other Ambulatory Visit (HOSPITAL_COMMUNITY): Payer: Self-pay

## 2023-02-02 ENCOUNTER — Other Ambulatory Visit: Payer: Self-pay

## 2023-02-05 ENCOUNTER — Other Ambulatory Visit (HOSPITAL_COMMUNITY): Payer: Self-pay

## 2023-02-05 ENCOUNTER — Other Ambulatory Visit: Payer: Self-pay

## 2023-02-08 ENCOUNTER — Other Ambulatory Visit (HOSPITAL_COMMUNITY): Payer: Self-pay

## 2023-02-16 ENCOUNTER — Ambulatory Visit (HOSPITAL_COMMUNITY): Payer: MEDICAID | Admitting: Mental Health

## 2023-03-02 ENCOUNTER — Other Ambulatory Visit: Payer: Self-pay

## 2023-03-02 ENCOUNTER — Ambulatory Visit: Payer: MEDICAID | Admitting: Pharmacist

## 2023-03-02 ENCOUNTER — Other Ambulatory Visit (HOSPITAL_COMMUNITY)
Admission: RE | Admit: 2023-03-02 | Discharge: 2023-03-02 | Disposition: A | Discharge status: Home / Self Care | Payer: MEDICAID | Source: Ambulatory Visit | Attending: Infectious Diseases | Admitting: Infectious Diseases

## 2023-03-02 DIAGNOSIS — Z21 Asymptomatic human immunodeficiency virus [HIV] infection status: Secondary | ICD-10-CM

## 2023-03-02 DIAGNOSIS — Z113 Encounter for screening for infections with a predominantly sexual mode of transmission: Secondary | ICD-10-CM

## 2023-03-02 DIAGNOSIS — Z202 Contact with and (suspected) exposure to infections with a predominantly sexual mode of transmission: Secondary | ICD-10-CM

## 2023-03-02 MED ORDER — PENICILLIN G BENZATHINE 1200000 UNIT/2ML IM SUSY
1.2000 10*6.[IU] | PREFILLED_SYRINGE | Freq: Once | INTRAMUSCULAR | Status: AC
Start: 2023-03-02 — End: 2023-03-02
  Administered 2023-03-02: 1.2 10*6.[IU] via INTRAMUSCULAR

## 2023-03-02 MED ORDER — CEFTRIAXONE SODIUM 500 MG IJ SOLR
500.0000 mg | Freq: Once | INTRAMUSCULAR | Status: AC
Start: 2023-03-02 — End: 2023-03-02
  Administered 2023-03-02: 500 mg via INTRAMUSCULAR
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
## 2023-03-03 LAB — URINE CYTOLOGY ANCILLARY ONLY
Chlamydia: NEGATIVE
Comment: NEGATIVE
Comment: NORMAL
Neisseria Gonorrhea: NEGATIVE

## 2023-03-03 LAB — CYTOLOGY, (ORAL, ANAL, URETHRAL) ANCILLARY ONLY
Chlamydia: NEGATIVE
Chlamydia: NEGATIVE
Comment: NEGATIVE
Comment: NEGATIVE
Comment: NORMAL
Comment: NORMAL
Neisseria Gonorrhea: NEGATIVE
Neisseria Gonorrhea: NEGATIVE

## 2023-03-05 LAB — T PALLIDUM AB: T Pallidum Abs: POSITIVE — AB

## 2023-03-05 LAB — HIV RNA, RTPCR W/R GT (RTI, PI,INT)
HIV 1 RNA Quant: <20 {copies}/mL — AB
HIV-1 RNA Quant, Log: <1.3 {Log} — AB

## 2023-03-05 LAB — RPR TITER: RPR Titer: 1:2 {titer} — ABNORMAL HIGH

## 2023-03-05 LAB — RPR: RPR Ser Ql: REACTIVE — AB

## 2023-03-18 NOTE — Progress Notes (Deleted)
 Brief Narrative   Patient ID: Steven [name], male    DOB: 19-Sep-1990, 33 y.o.   MRN: [id number]  Mr. Pinkham is a 33 y/o AA male with HIV disease diagnosed in June 2018. Genotype without significant resistance. Initial CD4 count was 280 and initial viral load of 55,300. No history of opportunistic infection. No previous treatment regimens prior to [place], [place].   Subjective:    No chief complaint on file.   HPI:  Steven [name] is a 33 y.o. male with HIV disease last seen on 03/02/2023 by Aggie Cosier, PharmD, CPP with concern for possible STI.  Viral load was undetectable.  Last CD4 count on 10/16/2022 was 1025.  STI testing was negative for gonorrhea and chlamydia.  Syphilis titer was down to 1: 2 from previous treatment level of 1: 32.  Concern for continued marijuana use and forgetting to take medication.  Here today for routine follow-up.     Denies fevers, chills, night sweats, headaches, changes in vision, neck pain/stiffness, nausea, diarrhea, vomiting, lesions or rashes.  Lab Results  Component Value Date   CD4TCELL 36 10/16/2022   CD4TABS 909 06/15/2022   Lab Results  Component Value Date   HIV1RNAQUANT <20 DETECTED (A) 03/02/2023     No Known Allergies    Outpatient Medications Prior to Visit  Medication Sig Dispense Refill   dolutegravir-lamiVUDine (DOVATO) 50-300 MG tablet Take 1 tablet by mouth daily. 30 tablet 6   doxycycline (VIBRA-TABS) 100 MG tablet Take 1 tablet (100 mg total) by mouth 2 (two) times daily. 14 tablet 0   erythromycin ophthalmic ointment Place a 1/2 inch ribbon of ointment into the lower eyelid. (Patient not taking: Reported on 10/16/2022) 3.5 g 0   lamoTRIgine (LAMICTAL) 100 MG tablet Take 1 tablet (100 mg total) by mouth daily. 30 tablet 5   QUEtiapine (SEROQUEL) 50 MG tablet Take 1 tablet (50 mg total) by mouth at bedtime. 30 tablet 5   No facility-administered medications prior to visit.     Past Medical History:   Diagnosis Date   Depression 07/09/2016   History of kidney stones    History of syphilis 07/09/2016   HIV (human immunodeficiency virus infection) (HCC) 06/29/2016   Dx 06/10/2016   Kidney stone    kidney stones     Past Surgical History:  Procedure Laterality Date   FACIAL LACERATION REPAIR N/A 11/16/2020   Procedure: REPAIR OF COMPLEX  LIP LACERATION;  Surgeon: Laren Boom, DO;  Location: [facility] OR;  Service: ENT;  Laterality: N/A;   FINGER SURGERY Left    ring finger   NOSE SURGERY        Review of Systems    Objective:    There were no vitals taken for this visit. Nursing note and vital signs reviewed.  Physical Exam      10/16/2022    9:40 AM 06/15/2022    1:48 PM 04/20/2022    4:05 PM 01/08/2022   10:06 AM 05/22/2021    1:37 PM  Depression screen PHQ 2/9  Decreased Interest 0 0 3 0 3  Down, Depressed, Hopeless 1 0 3 0 3  PHQ - 2 Score 1 0 6 0 6  Altered sleeping   3    Tired, decreased energy   3    Change in appetite   3    Feeling bad or failure about yourself    3    Trouble concentrating   3    Moving slowly or  fidgety/restless   1    Suicidal thoughts   2    PHQ-9 Score   24    Difficult doing work/chores   Very difficult         Assessment & Plan:    Patient Active Problem List   Diagnosis Date Noted   Depression 05/23/2021   Economic hardship 05/23/2021   High risk homosexual behavior 07/18/2018   Healthcare maintenance 08/25/2016   History of syphilis 07/09/2016   Outbursts of anger 07/09/2016   HIV (human immunodeficiency virus infection) (HCC) 06/29/2016     Problem List Items Addressed This Visit   None    I am having Joeph Augusta maintain his erythromycin, Dovato, lamoTRIgine, QUEtiapine, and doxycycline.   No orders of the defined types were placed in this encounter.    Follow-up: No follow-ups on file. or sooner if needed.    [name], MSN, FNP-C Nurse Practitioner [facility]  [facility] Main number: [phone]

## 2023-03-19 ENCOUNTER — Ambulatory Visit: Payer: MEDICAID | Admitting: Family

## 2023-03-19 ENCOUNTER — Telehealth: Payer: Self-pay

## 2023-03-19 NOTE — Telephone Encounter (Signed)
 Attempted to contact patient to reschedule. Left VM and sent mychart message advising they missed appointment today with Steven [name] and can reschedule when able.

## 2023-04-13 ENCOUNTER — Other Ambulatory Visit (HOSPITAL_COMMUNITY): Payer: Self-pay

## 2023-04-16 ENCOUNTER — Other Ambulatory Visit: Payer: Self-pay | Admitting: Pharmacist

## 2023-04-16 NOTE — Progress Notes (Signed)
 Inactivating due to inconsistent fill history, last fill in January, and missed appointments with Tammy Sours.  Margarite Gouge, PharmD, CPP, BCIDP, AAHIVP Clinical Pharmacist Practitioner Infectious Diseases Clinical Pharmacist [facility]

## 2023-04-20 ENCOUNTER — Emergency Department (HOSPITAL_COMMUNITY): Payer: MEDICAID

## 2023-04-20 ENCOUNTER — Emergency Department (HOSPITAL_COMMUNITY)
Admission: EM | Admit: 2023-04-20 | Discharge: 2023-04-20 | Disposition: A | Discharge status: Home / Self Care | Payer: MEDICAID | Attending: Emergency Medicine | Admitting: Emergency Medicine

## 2023-04-20 DIAGNOSIS — M79671 Pain in right foot: Secondary | ICD-10-CM | POA: Diagnosis present
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
## 2023-04-20 NOTE — Discharge Instructions (Addendum)
 Please refer to the attached instructions. Tylenol and/or ibuprofen for discomfort.

## 2023-05-03 ENCOUNTER — Other Ambulatory Visit: Payer: Self-pay

## 2023-05-03 ENCOUNTER — Other Ambulatory Visit (HOSPITAL_COMMUNITY): Payer: Self-pay

## 2023-05-06 ENCOUNTER — Other Ambulatory Visit: Payer: Self-pay

## 2023-05-06 DIAGNOSIS — Z21 Asymptomatic human immunodeficiency virus [HIV] infection status: Secondary | ICD-10-CM

## 2023-05-06 MED ORDER — DOVATO 50-300 MG PO TABS: 1.0000 | ORAL_TABLET | Freq: Every day | ORAL | 5 refills | Status: DC

## 2023-05-07 ENCOUNTER — Other Ambulatory Visit (HOSPITAL_COMMUNITY): Payer: Self-pay

## 2023-05-07 ENCOUNTER — Other Ambulatory Visit: Payer: Self-pay

## 2023-05-08 ENCOUNTER — Other Ambulatory Visit (HOSPITAL_COMMUNITY): Payer: Self-pay

## 2023-05-13 ENCOUNTER — Other Ambulatory Visit: Payer: Self-pay

## 2023-05-13 ENCOUNTER — Other Ambulatory Visit (HOSPITAL_COMMUNITY): Payer: Self-pay

## 2023-05-13 ENCOUNTER — Other Ambulatory Visit: Payer: Self-pay | Admitting: Pharmacist

## 2023-05-13 DIAGNOSIS — Z21 Asymptomatic human immunodeficiency virus [HIV] infection status: Secondary | ICD-10-CM

## 2023-05-13 MED ORDER — DOVATO 50-300 MG PO TABS
1.0000 | ORAL_TABLET | Freq: Every day | ORAL | 5 refills | Status: DC
  Filled 2023-05-13: qty 30, 30d supply, fill #0
  Filled 2023-06-25 – 2023-06-28 (×2): qty 30, 30d supply, fill #1
  Filled 2023-07-21 – 2023-08-17 (×2): qty 30, 30d supply, fill #2
  Filled 2023-09-13 – 2023-10-28 (×2): qty 30, 30d supply, fill #3
  Filled 2023-11-19: qty 30, 30d supply, fill #4

## 2023-05-13 NOTE — Progress Notes (Signed)
 Specialty Pharmacy Refill Coordination Note  Steven [name] is a 33 y.o. male contacted today regarding refills of specialty medication(s) Dolutegravir -lamiVUDine  (Dovato )   Patient requested Delivery   Delivery date: 05/17/23   Verified address: [address] [place] [address]   Medication will be filled on 05/14/23.

## 2023-05-13 NOTE — Progress Notes (Signed)
 Patient told staff he has received Dovato  from other pharmacies since January, but no dispense history since January in my report. If that is the case, has been off of medication x 4 months. Approving this refill given appointment next month, but he has no showed appointments so far this year. Mylinda Asa

## 2023-05-13 NOTE — Progress Notes (Signed)
 Specialty Pharmacy Initiation Note   Steven [name] is a 33 y.o. male who will be followed by the specialty pharmacy service for RxSp HIV    Review of administration, indication, effectiveness, safety, potential side effects, storage/disposable, and missed dose instructions occurred today for patient's specialty medication(s) Dolutegravir -lamiVUDine  (Dovato )     Patient/Caregiver did not have any additional questions or concerns.   Patient's therapy is appropriate to: Continue    Goals Addressed             This Visit's Progress    Achieve Undetectable HIV Viral Load < 20   Worsening    Patient is initiating therapy. Patient will work on increased adherence.      Comply with lab assessments   Worsening    Patient is initiating therapy. Patient will adhere to provider and/or lab appointments.      Improve or maintain quality of life   No change    Patient is initiating therapy. Patient will be monitored by provider to determine if a change in treatment plan is warranted.         Sonya Duster Specialty Pharmacist

## 2023-05-14 ENCOUNTER — Other Ambulatory Visit: Payer: Self-pay

## 2023-06-01 ENCOUNTER — Other Ambulatory Visit (HOSPITAL_COMMUNITY): Payer: Self-pay

## 2023-06-04 ENCOUNTER — Other Ambulatory Visit (HOSPITAL_COMMUNITY): Payer: Self-pay

## 2023-06-07 ENCOUNTER — Other Ambulatory Visit: Payer: Self-pay

## 2023-06-09 ENCOUNTER — Ambulatory Visit: Payer: MEDICAID | Admitting: Family

## 2023-06-14 ENCOUNTER — Other Ambulatory Visit (HOSPITAL_COMMUNITY): Payer: Self-pay

## 2023-06-24 ENCOUNTER — Ambulatory Visit: Payer: MEDICAID | Admitting: Family

## 2023-06-25 ENCOUNTER — Other Ambulatory Visit (HOSPITAL_COMMUNITY): Payer: Self-pay

## 2023-06-25 ENCOUNTER — Ambulatory Visit: Payer: MEDICAID | Admitting: Family

## 2023-06-25 ENCOUNTER — Other Ambulatory Visit: Payer: Self-pay

## 2023-06-25 ENCOUNTER — Other Ambulatory Visit: Payer: Self-pay | Admitting: Pharmacy Technician

## 2023-06-25 NOTE — Progress Notes (Signed)
 Specialty Pharmacy Refill Coordination Note  Steven [name] is a 33 y.o. male contacted today regarding refills of specialty medication(s) Dolutegravir -lamiVUDine  (Dovato )   Patient requested [facility] at [facility] at [place] date: 06/25/23   Medication will be filled on 06/25/23.

## 2023-06-26 ENCOUNTER — Other Ambulatory Visit (HOSPITAL_COMMUNITY): Payer: Self-pay

## 2023-06-28 ENCOUNTER — Other Ambulatory Visit: Payer: Self-pay

## 2023-06-28 ENCOUNTER — Other Ambulatory Visit (HOSPITAL_COMMUNITY): Payer: Self-pay

## 2023-07-13 ENCOUNTER — Other Ambulatory Visit (HOSPITAL_COMMUNITY): Payer: Self-pay

## 2023-07-16 ENCOUNTER — Other Ambulatory Visit (HOSPITAL_COMMUNITY): Payer: Self-pay

## 2023-07-16 ENCOUNTER — Other Ambulatory Visit: Payer: Self-pay

## 2023-07-16 MED ORDER — QUETIAPINE FUMARATE 50 MG PO TABS
50.0000 mg | ORAL_TABLET | Freq: Every day | ORAL | 0 refills | Status: AC
  Filled 2023-07-16: qty 30, 30d supply, fill #0

## 2023-07-16 MED ORDER — LAMOTRIGINE 25 MG PO TABS
ORAL_TABLET | ORAL | 0 refills | Status: DC
  Filled 2023-07-16: qty 14, 14d supply, fill #0

## 2023-07-21 ENCOUNTER — Other Ambulatory Visit (HOSPITAL_COMMUNITY): Payer: Self-pay

## 2023-07-21 ENCOUNTER — Other Ambulatory Visit: Payer: Self-pay

## 2023-07-22 ENCOUNTER — Ambulatory Visit: Payer: MEDICAID | Admitting: Family

## 2023-07-23 ENCOUNTER — Other Ambulatory Visit: Payer: Self-pay

## 2023-07-29 ENCOUNTER — Other Ambulatory Visit (HOSPITAL_COMMUNITY): Payer: Self-pay

## 2023-07-29 ENCOUNTER — Other Ambulatory Visit: Payer: Self-pay

## 2023-07-29 MED ORDER — LAMOTRIGINE 25 MG PO TABS
50.0000 mg | ORAL_TABLET | Freq: Every day | ORAL | 0 refills | Status: AC
  Filled 2023-07-29: qty 28, 14d supply, fill #0

## 2023-07-29 MED ORDER — QUETIAPINE FUMARATE 50 MG PO TABS
50.0000 mg | ORAL_TABLET | Freq: Every day | ORAL | 0 refills | Status: AC
  Filled 2023-07-29: qty 30, 30d supply, fill #0

## 2023-08-03 ENCOUNTER — Other Ambulatory Visit (HOSPITAL_COMMUNITY): Payer: Self-pay

## 2023-08-17 ENCOUNTER — Other Ambulatory Visit (HOSPITAL_COMMUNITY): Payer: Self-pay

## 2023-08-17 ENCOUNTER — Other Ambulatory Visit: Payer: Self-pay

## 2023-08-17 NOTE — Progress Notes (Signed)
 Specialty Pharmacy Refill Coordination Note  Spoke with Millie Parcell  Obediah Welles is a 33 y.o. male contacted today regarding refills of specialty medication(s) Dolutegravir -lamiVUDine  (Dovato )  Doses on hand: 0  Patient requested: Delivery   Delivery date: 08/19/23   Verified address: [address] [place] [address]  Medication will be filled on 08/18/23.

## 2023-08-18 ENCOUNTER — Other Ambulatory Visit (HOSPITAL_COMMUNITY): Payer: Self-pay

## 2023-08-30 ENCOUNTER — Other Ambulatory Visit: Payer: Self-pay

## 2023-09-08 ENCOUNTER — Ambulatory Visit: Payer: Self-pay

## 2023-09-08 NOTE — Telephone Encounter (Signed)
  FYI Only or Action Required?: FYI only for provider.  Patient was last seen in primary care on new patient.  Called Nurse Triage reporting Dental Injury.  Symptoms began today.  Interventions attempted: Nothing.  Symptoms are: unchanged.  Triage Disposition: Information or Advice Only Call  Patient/caregiver understands and will follow disposition?: Unsure  Copied from CRM #[id number]. Topic: Clinical - Red Word Triage >> Sep 08, 2023  9:13 AM Graeme ORN wrote: Red Word that prompted transfer to Nurse Triage: front tooth came out - pain 10 ED or Dentist >> Sep 08, 2023  9:33 AM Treva T wrote: Patient calling back, still having tooth pain, has not went to ER, he is requesting names of dentists he can go to to evaluate tooth pain he is having.  Reason for Disposition  General information question, no triage required and triager able to answer question  Answer Assessment - Initial Assessment Questions 1. REASON FOR CALL: What is the main reason for your call? or How can I best help you?     Patient called to ask for a list of dentists that he can go to instead of going to the ED.   This nurse explained that we do not have that kind of information available.   Patient stated that the front desk at the office has this information.  This nurse contacted CAL, Doyal, at [facility] who has no such list, and recommended that the patient google dentists in his area.  This nurse also recommended that he contact the phone number on his medicaid card to find out which dentists accept his insurance.   Patient reports that the only number on his medicaid paper is for [facility].   Patient disconnected call.  Protocols used: Information Only Call - No Triage-A-AH

## 2023-09-08 NOTE — Telephone Encounter (Signed)
 FYI Only or Action Required?: FYI only for provider.  Patient was last seen in primary care on N/A.  Called Nurse Triage reporting Dental Injury.  Symptoms began yesterday.  Interventions attempted: OTC medications: Advil .  Symptoms are: unchanged.  Triage Disposition: Go to ED Now (Notify PCP)  Patient/caregiver understands and will follow disposition?: Yes          Copied from CRM #[id number]. Topic: Clinical - Red Word Triage >> Sep 08, 2023  9:13 AM Steven [name] ORN wrote: Red Word that prompted transfer to Nurse Triage: front tooth came out - pain 10 ED or Dentist Reason for Disposition  Tooth knocked out  Answer Assessment - Initial Assessment Questions MECHANISM: How did the injury happen?      Pt states he was eating some chips in bed last night and pt heard a pop noise; pt states his front tooth is very loose  ONSET: When did the injury happen? (e.g., minutes or hours ago)      Last night  BLEEDING: Is the mouth still bleeding? If Yes, ask: Is it difficult to stop?      No bleeding  PAIN: Is it painful? If Yes, ask: How bad is the pain? (Scale 0-10; or none, mild, moderate, severe)     10/10 pain level  Protocols used: Tooth Injury-A-AH

## 2023-09-13 ENCOUNTER — Other Ambulatory Visit: Payer: Self-pay

## 2023-09-15 ENCOUNTER — Other Ambulatory Visit: Payer: Self-pay

## 2023-10-14 ENCOUNTER — Ambulatory Visit: Payer: MEDICAID | Admitting: Family

## 2023-10-28 ENCOUNTER — Other Ambulatory Visit: Payer: Self-pay

## 2023-10-28 ENCOUNTER — Other Ambulatory Visit (HOSPITAL_COMMUNITY): Payer: Self-pay

## 2023-10-28 NOTE — Progress Notes (Signed)
 Specialty Pharmacy Refill Coordination Note  Spoke with Leopoldo Hewlett  Billyjack Trompeter is a 33 y.o. male contacted today regarding refills of specialty medication(s) Dolutegravir -lamiVUDine  (Dovato )  Doses on hand: 0  Patient requested: Delivery   Delivery date: 11/01/23   Verified address: [address] [place] [address]  Medication will be filled on 10/29/23.

## 2023-10-29 ENCOUNTER — Other Ambulatory Visit: Payer: Self-pay

## 2023-11-01 ENCOUNTER — Other Ambulatory Visit: Payer: Self-pay

## 2023-11-01 NOTE — Progress Notes (Signed)
 Clinical Intervention Note  Clinical Intervention Notes: Patient reported that he had missed 10 doses, although fill history shows it should be close to 40. Patient reported that missed the pharmacy call, but that he would get back on track with adherence and updated phone number with pharmacy. Patient has appointment and labs with [facility] on 11/04/23.   Clinical Intervention Outcomes: Improved therapy adherence   [facility] Specialty Pharmacist

## 2023-11-03 ENCOUNTER — Other Ambulatory Visit (HOSPITAL_COMMUNITY): Payer: Self-pay

## 2023-11-03 ENCOUNTER — Other Ambulatory Visit: Payer: Self-pay

## 2023-11-03 MED ORDER — SULFAMETHOXAZOLE-TRIMETHOPRIM 800-160 MG PO TABS
1.0000 | ORAL_TABLET | Freq: Two times a day (BID) | ORAL | 0 refills | Status: AC
  Filled 2023-11-03: qty 14, 7d supply, fill #0

## 2023-11-03 MED ORDER — IBUPROFEN 600 MG PO TABS
600.0000 mg | ORAL_TABLET | Freq: Four times a day (QID) | ORAL | 0 refills | Status: DC | PRN
  Filled 2023-11-03: qty 60, 15d supply, fill #0

## 2023-11-04 ENCOUNTER — Ambulatory Visit: Payer: MEDICAID | Admitting: Family

## 2023-11-19 ENCOUNTER — Other Ambulatory Visit: Payer: Self-pay

## 2023-11-23 ENCOUNTER — Other Ambulatory Visit (HOSPITAL_COMMUNITY): Payer: Self-pay

## 2023-11-24 ENCOUNTER — Other Ambulatory Visit (HOSPITAL_COMMUNITY): Payer: Self-pay

## 2023-11-26 ENCOUNTER — Other Ambulatory Visit: Payer: Self-pay

## 2023-11-26 ENCOUNTER — Other Ambulatory Visit (HOSPITAL_COMMUNITY): Payer: Self-pay

## 2023-11-26 NOTE — Progress Notes (Signed)
 Specialty Pharmacy Refill Coordination Note  Spoke with Steven [name]  Steven [name] is a 33 y.o. male contacted today regarding refills of specialty medication(s) Dolutegravir -lamiVUDine  (Dovato )  Doses on hand: 5  Patient requested: Delivery   Delivery date: 11/30/23   Verified address: [address] [place] [address]  Medication will be filled on 11/29/23 .

## 2023-11-29 ENCOUNTER — Other Ambulatory Visit (HOSPITAL_COMMUNITY): Payer: Self-pay

## 2023-11-29 MED ORDER — ACETAMINOPHEN-CODEINE 300-30 MG PO TABS
1.0000 | ORAL_TABLET | Freq: Four times a day (QID) | ORAL | 0 refills | Status: AC | PRN
  Filled 2023-11-29: qty 12, 3d supply, fill #0

## 2023-11-29 MED ORDER — IBUPROFEN 800 MG PO TABS
800.0000 mg | ORAL_TABLET | Freq: Four times a day (QID) | ORAL | 0 refills | Status: AC | PRN
  Filled 2023-11-29: qty 21, 6d supply, fill #0

## 2023-11-29 MED ORDER — AMOXICILLIN 500 MG PO CAPS
500.0000 mg | ORAL_CAPSULE | Freq: Three times a day (TID) | ORAL | 0 refills | Status: AC
  Filled 2023-11-29: qty 21, 7d supply, fill #0

## 2023-11-29 MED ORDER — IBUPROFEN 800 MG PO TABS
800.0000 mg | ORAL_TABLET | Freq: Four times a day (QID) | ORAL | 0 refills | Status: AC | PRN
  Filled 2023-11-29 (×2): qty 21, 6d supply, fill #0

## 2023-12-09 ENCOUNTER — Other Ambulatory Visit: Payer: Self-pay | Admitting: Pharmacist

## 2023-12-09 ENCOUNTER — Ambulatory Visit (INDEPENDENT_AMBULATORY_CARE_PROVIDER_SITE_OTHER): Payer: MEDICAID | Admitting: Pharmacist

## 2023-12-09 ENCOUNTER — Other Ambulatory Visit: Payer: Self-pay

## 2023-12-09 ENCOUNTER — Other Ambulatory Visit (HOSPITAL_COMMUNITY): Payer: Self-pay

## 2023-12-09 ENCOUNTER — Other Ambulatory Visit (HOSPITAL_COMMUNITY)
Admission: RE | Admit: 2023-12-09 | Discharge: 2023-12-09 | Disposition: A | Payer: MEDICAID | Source: Ambulatory Visit | Attending: Infectious Diseases | Admitting: Infectious Diseases

## 2023-12-09 DIAGNOSIS — Z113 Encounter for screening for infections with a predominantly sexual mode of transmission: Secondary | ICD-10-CM | POA: Insufficient documentation

## 2023-12-09 DIAGNOSIS — Z21 Asymptomatic human immunodeficiency virus [HIV] infection status: Secondary | ICD-10-CM

## 2023-12-09 MED ORDER — DELSTRIGO 100-300-300 MG PO TABS
1.0000 | ORAL_TABLET | Freq: Every day | ORAL | 3 refills | Status: AC
  Filled 2023-12-09: qty 30, 30d supply, fill #0
  Filled 2023-12-31: qty 30, 30d supply, fill #1
  Filled 2024-01-31: qty 30, 30d supply, fill #2

## 2023-12-09 MED ORDER — AMOXICILLIN 500 MG PO CAPS
500.0000 mg | ORAL_CAPSULE | Freq: Three times a day (TID) | ORAL | 0 refills | Status: DC
  Filled 2023-12-09: qty 21, 7d supply, fill #0

## 2023-12-09 NOTE — Progress Notes (Signed)
 HPI: Steven [name] is a 33 y.o. male who presents to the [facility] pharmacy clinic for HIV follow-up after multiple missed appointments.   Referring ID Physician: Cathlyn July, NP   Patient Active Problem List   Diagnosis Date Noted   Depression 05/23/2021   Economic hardship 05/23/2021   High risk homosexual behavior 07/18/2018   Healthcare maintenance 08/25/2016   History of syphilis 07/09/2016   Outbursts of anger 07/09/2016   HIV (human immunodeficiency virus infection) (HCC) 06/29/2016    Patient's Medications  New Prescriptions   No medications on file  Previous Medications   DOLUTEGRAVIR -LAMIVUDINE  (DOVATO ) 50-300 MG TABLET    Take 1 tablet by mouth daily.   DOXYCYCLINE  (VIBRA -TABS) 100 MG TABLET    Take 1 tablet (100 mg total) by mouth 2 (two) times daily.   ERYTHROMYCIN  OPHTHALMIC OINTMENT    Place a 1/2 inch ribbon of ointment into the lower eyelid.   IBUPROFEN  (ADVIL ) 600 MG TABLET    Take 1 tablet (600 mg total) by mouth every 6 (six) hours as needed for moderate pain (4-6).   LAMOTRIGINE  (LAMICTAL ) 100 MG TABLET    Take 1 tablet (100 mg total) by mouth daily.   LAMOTRIGINE  (LAMICTAL ) 25 MG TABLET    Take 2 tablets (50 mg total) by mouth daily.   QUETIAPINE  (SEROQUEL ) 50 MG TABLET    Take 1 tablet (50 mg total) by mouth at bedtime.   QUETIAPINE  (SEROQUEL ) 50 MG TABLET    Take 1 tablet by mouth daily at bedtime   QUETIAPINE  (SEROQUEL ) 50 MG TABLET    Take 1 tablet by mouth daily at bedtime  Modified Medications   No medications on file  Discontinued Medications   No medications on file    Allergies: No Known Allergies  Past Medical History: Past Medical History:  Diagnosis Date   Depression 07/09/2016   History of kidney stones    History of syphilis 07/09/2016   HIV (human immunodeficiency virus infection) (HCC) 06/29/2016   Dx 06/10/2016   Kidney stone    kidney stones    Social History: Social History   Socioeconomic History   Marital status: Single     Spouse name: Not on file   Number of children: Not on file   Years of education: Not on file   Highest education level: Not on file  Occupational History   Not on file  Tobacco Use   Smoking status: Former    Types: Cigars   Smokeless tobacco: Never  Vaping Use   Vaping status: Every Day   Substances: Nicotine  Substance and Sexual Activity   Alcohol use: Not Currently    Comment: socially   Drug use: Yes    Types: Marijuana   Sexual activity: Not Currently    Partners: Male    Birth control/protection: Condom    Comment: declined condoms  Other Topics Concern   Not on file  Social History Narrative   Not on file   Social Drivers of Health   Financial Resource Strain: Not on file  Food Insecurity: Not on file  Transportation Needs: Not on file  Physical Activity: Not on file  Stress: Not on file  Social Connections: Not on file    Labs: Lab Results  Component Value Date   HIV1RNAQUANT <20 DETECTED (A) 03/02/2023   HIV1RNAQUANT <20 (H) 10/16/2022   HIV1RNAQUANT Not Detected 06/15/2022   CD4TABS 909 06/15/2022   CD4TABS 1,067 01/08/2022   CD4TABS 581 05/22/2021    RPR  and STI Lab Results  Component Value Date   LABRPR REACTIVE (A) 03/02/2023   LABRPR REACTIVE (A) 06/15/2022   LABRPR Reactive (A) 04/16/2022   LABRPR REACTIVE (A) 01/08/2022   LABRPR REACTIVE (A) 05/22/2021   RPRTITER 1:2 (H) 03/02/2023   RPRTITER 1:4 (H) 06/15/2022   RPRTITER 1:32 (H) 01/08/2022   RPRTITER 1:2 (H) 05/22/2021   RPRTITER 1:1 (H) 01/10/2021    STI Results GC CT  03/02/2023  3:25 PM Negative    Negative    Negative  Negative    Negative    Negative   04/16/2022  3:58 PM Negative    Negative    Negative  Negative    Negative    Negative   08/24/2021  5:15 PM Negative  Negative   05/22/2021  2:09 PM Negative    Negative    Negative  Negative    Negative    Positive   04/04/2020  4:33 PM Negative    Negative    Negative  Negative    Negative    Negative    12/07/2019  3:38 PM Negative  Negative   05/23/2019  9:58 AM Positive    Negative  Negative    Negative   10/17/2018 10:42 AM Positive  Negative   07/18/2018 12:00 AM Negative    Negative  Negative    Negative   03/22/2018 12:00 AM **POSITIVE**    **POSITIVE**    Negative  Negative    **POSITIVE**    Negative   12/17/2017 12:00 AM Negative    Negative    Negative  **POSITIVE**    Negative    Negative   01/28/2017 12:00 AM Negative  Negative   12/18/2016 12:00 AM Negative    Negative    Negative  Negative    Negative    Negative   08/25/2016 12:00 AM **POSITIVE**    Negative  Negative    Negative   07/09/2016 12:00 AM Negative    Negative  Negative    Negative   06/10/2016 12:00 AM Negative  Negative     Hepatitis B Lab Results  Component Value Date   HEPBSAB NEG 07/09/2016   HEPBSAG NON-REACTIVE 12/17/2017   Hepatitis C Lab Results  Component Value Date   HEPCAB NON-REACTIVE 12/17/2017   Hepatitis A Lab Results  Component Value Date   HAV NON-REACTIVE 12/17/2017   Lipids: Lab Results  Component Value Date   CHOL 146 01/08/2022   TRIG 53 01/08/2022   HDL 72 01/08/2022   CHOLHDL 2.0 01/08/2022   VLDL 6 07/09/2016   LDLCALC 61 01/08/2022    Current HIV Regimen: Dovato   Assessment: Archimedes presents to clinic today for HIV follow-up after multiple missed appointments with Cathlyn; last followed with him in October 2024. States his busy work schedule is the main barrier to visiting the office. Has been adherent with Dovato ; states he has not missed doses since over the summer. No issues with [facility] delivery.  States he has been gaining weight while on Dovato . Has tried intermittent fasting and other dietary changes without noting major impact on his weight. Discussed weight gain is multifactorial but that Dovato  could theoretically contribute to this. Will trial Delstrigo and follow up with Cathlyn in January to see how the transition is going. Will check HIV RNA  along with routine annual labs.  Accepts full STI testing today. Eligible for HAV, flu, COVID, Shingles, and PCV20 vaccines; these were not discussed during visit as patient had another appointment right  after and was in a rush.   Plan: - Stop Dovato  - Start Delstrigo  - Check HIV RNA, CD4 count, CMP, CBC, lipid panel, RPR and urine/oral/rectal cytologies - Follow up with Cathlyn on 01/26/24   Alan Geralds, PharmD, CPP, BCIDP, AAHIVP Clinical Pharmacist Practitioner Infectious Diseases Clinical Pharmacist [facility] 12/09/2023, 8:56 AM

## 2023-12-09 NOTE — Progress Notes (Signed)
 Specialty Pharmacy Initial Fill Coordination Note  Steven [name] is a 33 y.o. male contacted today regarding initial fill of specialty medication(s) Doravirin-Lamivudin-Tenofov DF (Delstrigo)   Patient requested Delivery   Delivery date: 12/13/23   Verified address: [address] [name] [address]   Medication will be filled on 12/10/23.   Patient is aware of 0.00 copayment.

## 2023-12-09 NOTE — Progress Notes (Signed)
 Specialty Pharmacy Initiation Note   Steven [name] is a 33 y.o. male who will be followed by the specialty pharmacy service for RxSp HIV    Review of administration, indication, effectiveness, safety, potential side effects, storage/disposable, and missed dose instructions occurred today for patient's specialty medication(s) Doravirin-Lamivudin-Tenofov DF (Delstrigo)     Patient/Caregiver did not have any additional questions or concerns.   Patient's therapy is appropriate to: Initiate    Goals Addressed             This Visit's Progress    Achieve Undetectable HIV Viral Load < 20   On track    Patient is initiating therapy. Patient will work on increased adherence.      Comply with lab assessments   On track    Patient is initiating therapy. Patient will adhere to provider and/or lab appointments.      Improve or maintain quality of life   On track    Patient is initiating therapy. Patient will be monitored by provider to determine if a change in treatment plan is warranted.         Alan JINNY Geralds Specialty Pharmacist

## 2023-12-10 ENCOUNTER — Other Ambulatory Visit: Payer: Self-pay

## 2023-12-10 LAB — URINE CYTOLOGY ANCILLARY ONLY
Chlamydia: NEGATIVE
Comment: NEGATIVE
Comment: NORMAL
Neisseria Gonorrhea: NEGATIVE

## 2023-12-10 LAB — T-HELPER CELLS (CD4) COUNT (NOT AT ARMC)
CD4 % Helper T Cell: 33 % (ref 33–65)
CD4 T Cell Abs: 738 /uL (ref 400–1790)

## 2023-12-10 LAB — CYTOLOGY, (ORAL, ANAL, URETHRAL) ANCILLARY ONLY
Chlamydia: NEGATIVE
Chlamydia: NEGATIVE
Comment: NEGATIVE
Comment: NEGATIVE
Comment: NORMAL
Comment: NORMAL
Neisseria Gonorrhea: NEGATIVE
Neisseria Gonorrhea: NEGATIVE

## 2023-12-14 LAB — COMPREHENSIVE METABOLIC PANEL WITH GFR
AG Ratio: 2 (calc) (ref 1.0–2.5)
ALT: 25 U/L (ref 9–46)
AST: 20 U/L (ref 10–40)
Albumin: 4.7 g/dL (ref 3.6–5.1)
Alkaline phosphatase (APISO): 47 U/L (ref 36–130)
BUN: 9 mg/dL (ref 7–25)
CO2: 28 mmol/L (ref 20–32)
Calcium: 9.4 mg/dL (ref 8.6–10.3)
Chloride: 102 mmol/L (ref 98–110)
Creat: 0.96 mg/dL (ref 0.60–1.26)
Globulin: 2.4 g/dL (ref 1.9–3.7)
Glucose, Bld: 85 mg/dL (ref 65–99)
Potassium: 4.1 mmol/L (ref 3.5–5.3)
Sodium: 137 mmol/L (ref 135–146)
Total Bilirubin: 0.4 mg/dL (ref 0.2–1.2)
Total Protein: 7.1 g/dL (ref 6.1–8.1)
eGFR: 108 mL/min/1.73m2 (ref >=60)

## 2023-12-14 LAB — CBC WITH DIFFERENTIAL/PLATELET
Absolute Lymphocytes: 2350 {cells}/uL (ref 850–3900)
Absolute Monocytes: 466 {cells}/uL (ref 200–950)
Basophils Absolute: 50 {cells}/uL (ref 0–200)
Basophils Relative: 0.8 %
Eosinophils Absolute: 183 {cells}/uL (ref 15–500)
Eosinophils Relative: 2.9 %
HCT: 46.5 % (ref 39.4–51.1)
Hemoglobin: 16 g/dL (ref 13.2–17.1)
MCH: 30.6 pg (ref 27.0–33.0)
MCHC: 34.4 g/dL (ref 31.6–35.4)
MCV: 88.9 fL (ref 81.4–101.7)
MPV: 10.7 fL (ref 7.5–12.5)
Monocytes Relative: 7.4 %
Neutro Abs: 3251 {cells}/uL (ref 1500–7800)
Neutrophils Relative %: 51.6 %
Platelets: 149 Thousand/uL (ref 140–400)
RBC: 5.23 Million/uL (ref 4.20–5.80)
RDW: 13.2 % (ref 11.0–15.0)
Total Lymphocyte: 37.3 %
WBC: 6.3 Thousand/uL (ref 3.8–10.8)

## 2023-12-14 LAB — T PALLIDUM AB: T Pallidum Abs: POSITIVE — AB

## 2023-12-14 LAB — LIPID PANEL
Cholesterol: 149 mg/dL (ref <200)
HDL: 79 mg/dL (ref >=40)
LDL Cholesterol (Calc): 62 mg/dL
Non-HDL Cholesterol (Calc): 70 mg/dL (ref <130)
Total CHOL/HDL Ratio: 1.9 (calc) (ref <5.0)
Triglycerides: 29 mg/dL (ref <150)

## 2023-12-14 LAB — SYPHILIS: RPR W/REFLEX TO RPR TITER AND TREPONEMAL ANTIBODIES, TRADITIONAL SCREENING AND DIAGNOSIS ALGORITHM: RPR Ser Ql: REACTIVE — AB

## 2023-12-14 LAB — HIV-1 RNA QUANT-NO REFLEX-BLD
HIV 1 RNA Quant: 22 {copies}/mL — ABNORMAL HIGH
HIV-1 RNA Quant, Log: 1.34 {Log_copies}/mL — ABNORMAL HIGH

## 2023-12-14 LAB — RPR TITER: RPR Titer: 1:2 {titer} — ABNORMAL HIGH

## 2023-12-21 ENCOUNTER — Other Ambulatory Visit (HOSPITAL_COMMUNITY): Payer: Self-pay

## 2023-12-22 ENCOUNTER — Encounter: Payer: Self-pay | Admitting: Pharmacist

## 2023-12-22 ENCOUNTER — Telehealth: Payer: Self-pay | Admitting: Family

## 2023-12-22 NOTE — Telephone Encounter (Signed)
 Steven [name] called regarding his medication. He stated he was switched to a new medication and his stomach pain is at a 10. Pt stated he believes he may need a lower dosage. Steven [name] is scheduled 1/21 with Cathlyn and can be reached at [phone].

## 2023-12-22 NOTE — Telephone Encounter (Signed)
 LVM with patient requesting call back; also sent MyChart message. Recently transitioned from Dovato  to Delstrigo  due to some weight gain concerns. Would recommend switching back to Dovato  until he follows with Cathlyn in January. - Alaine Loughney

## 2023-12-22 NOTE — Telephone Encounter (Signed)
 Patient returned call and clarified symptoms as diarrhea and cramping after eating food. Encouraged him to try Pepto Bismol to see if this helps. He would rather try for relief first before switching back to Dovato . Reminded him that these symptoms usually dissipate after 2-4 weeks.  Alan Geralds, PharmD, CPP, BCIDP, AAHIVP Clinical Pharmacist Practitioner Infectious Diseases Clinical Pharmacist [facility]

## 2023-12-29 ENCOUNTER — Other Ambulatory Visit: Payer: Self-pay

## 2023-12-31 ENCOUNTER — Other Ambulatory Visit: Payer: Self-pay

## 2024-01-04 ENCOUNTER — Other Ambulatory Visit: Payer: Self-pay

## 2024-01-07 ENCOUNTER — Other Ambulatory Visit (HOSPITAL_COMMUNITY): Payer: Self-pay

## 2024-01-07 ENCOUNTER — Other Ambulatory Visit: Payer: Self-pay

## 2024-01-07 NOTE — Progress Notes (Signed)
 Specialty Pharmacy Refill Coordination Note  Steven [name] is a 35 y.o. male contacted today regarding refills of specialty medication(s) Doravirin-Lamivudin-Tenofov DF (Delstrigo )   Patient requested Delivery   Delivery date: 01/10/24   Verified address: [address] [place] [address]   Medication will be filled on: 01/07/24

## 2024-01-13 ENCOUNTER — Ambulatory Visit
Admission: EM | Admit: 2024-01-13 | Discharge: 2024-01-13 | Disposition: A | Discharge status: Home / Self Care | Payer: MEDICAID | Attending: Student | Admitting: Student

## 2024-01-13 ENCOUNTER — Encounter (HOSPITAL_COMMUNITY): Payer: Self-pay

## 2024-01-13 ENCOUNTER — Emergency Department (HOSPITAL_COMMUNITY): Payer: MEDICAID

## 2024-01-13 ENCOUNTER — Other Ambulatory Visit: Payer: Self-pay

## 2024-01-13 ENCOUNTER — Encounter: Payer: Self-pay | Admitting: Emergency Medicine

## 2024-01-13 ENCOUNTER — Emergency Department (HOSPITAL_COMMUNITY)
Admission: EM | Admit: 2024-01-13 | Discharge: 2024-01-13 | Disposition: A | Discharge status: Home / Self Care | Payer: MEDICAID | Attending: Emergency Medicine | Admitting: Emergency Medicine

## 2024-01-13 DIAGNOSIS — R1031 Right lower quadrant pain: Secondary | ICD-10-CM | POA: Diagnosis not present

## 2024-01-13 DIAGNOSIS — Z21 Asymptomatic human immunodeficiency virus [HIV] infection status: Secondary | ICD-10-CM | POA: Insufficient documentation

## 2024-01-13 DIAGNOSIS — N200 Calculus of kidney: Secondary | ICD-10-CM

## 2024-01-13 DIAGNOSIS — Z720 Tobacco use: Secondary | ICD-10-CM | POA: Insufficient documentation

## 2024-01-13 DIAGNOSIS — N132 Hydronephrosis with renal and ureteral calculous obstruction: Secondary | ICD-10-CM | POA: Diagnosis not present

## 2024-01-13 DIAGNOSIS — R10A1 Flank pain, right side: Secondary | ICD-10-CM | POA: Diagnosis present

## 2024-01-13 LAB — URINALYSIS, ROUTINE W REFLEX MICROSCOPIC
Bacteria, UA: NONE SEEN
Bilirubin Urine: NEGATIVE
Glucose, UA: NEGATIVE mg/dL
Ketones, ur: 5 mg/dL — AB
Leukocytes,Ua: NEGATIVE
Nitrite: NEGATIVE
Protein, ur: NEGATIVE mg/dL
Specific Gravity, Urine: 1.018 (ref 1.005–1.030)
pH: 5 (ref 5.0–8.0)

## 2024-01-13 LAB — COMPREHENSIVE METABOLIC PANEL WITH GFR
ALT: 21 U/L (ref 0–44)
AST: 26 U/L (ref 15–41)
Albumin: 4.8 g/dL (ref 3.5–5.0)
Alkaline Phosphatase: 52 U/L (ref 38–126)
Anion gap: 13 (ref 5–15)
BUN: 11 mg/dL (ref 6–20)
CO2: 21 mmol/L — ABNORMAL LOW (ref 22–32)
Calcium: 9.7 mg/dL (ref 8.9–10.3)
Chloride: 102 mmol/L (ref 98–111)
Creatinine, Ser: 1.03 mg/dL (ref 0.61–1.24)
GFR, Estimated: >60 mL/min
Glucose, Bld: 87 mg/dL (ref 70–99)
Potassium: 4.3 mmol/L (ref 3.5–5.1)
Sodium: 136 mmol/L (ref 135–145)
Total Bilirubin: 0.5 mg/dL (ref 0.0–1.2)
Total Protein: 7.6 g/dL (ref 6.5–8.1)

## 2024-01-13 LAB — CBC WITH DIFFERENTIAL/PLATELET
Abs Immature Granulocytes: 0.04 K/uL (ref 0.00–0.07)
Basophils Absolute: 0 K/uL (ref 0.0–0.1)
Basophils Relative: 1 %
Eosinophils Absolute: 0.2 K/uL (ref 0.0–0.5)
Eosinophils Relative: 4 %
HCT: 45.7 % (ref 39.0–52.0)
Hemoglobin: 16.6 g/dL (ref 13.0–17.0)
Immature Granulocytes: 1 %
Lymphocytes Relative: 45 %
Lymphs Abs: 2.7 K/uL (ref 0.7–4.0)
MCH: 30.6 pg (ref 26.0–34.0)
MCHC: 36.3 g/dL — ABNORMAL HIGH (ref 30.0–36.0)
MCV: 84.3 fL (ref 80.0–100.0)
Monocytes Absolute: 0.4 K/uL (ref 0.1–1.0)
Monocytes Relative: 7 %
Neutro Abs: 2.5 K/uL (ref 1.7–7.7)
Neutrophils Relative %: 42 %
Platelets: 192 K/uL (ref 150–400)
RBC: 5.42 MIL/uL (ref 4.22–5.81)
RDW: 12.5 % (ref 11.5–15.5)
WBC: 6 K/uL (ref 4.0–10.5)
nRBC: 0 % (ref 0.0–0.2)

## 2024-01-13 LAB — POCT URINE DIPSTICK
Bilirubin, UA: NEGATIVE
Glucose, UA: NEGATIVE mg/dL
Ketones, POC UA: NEGATIVE mg/dL
Leukocytes, UA: NEGATIVE
Nitrite, UA: NEGATIVE
POC PROTEIN,UA: NEGATIVE
Spec Grav, UA: 1.025
Urobilinogen, UA: 0.2 U/dL
pH, UA: 6

## 2024-01-13 MED ORDER — KETOROLAC TROMETHAMINE 15 MG/ML IJ SOLN
15.0000 mg | Freq: Once | INTRAMUSCULAR | Status: AC
  Administered 2024-01-13: 15 mg via INTRAVENOUS
  Filled 2024-01-13: qty 1

## 2024-01-13 MED ORDER — ONDANSETRON 4 MG PO TBDP
4.0000 mg | ORAL_TABLET | Freq: Four times a day (QID) | ORAL | 0 refills | Status: AC | PRN
  Filled 2024-01-13: qty 20, 5d supply, fill #0

## 2024-01-13 MED ORDER — TAMSULOSIN HCL 0.4 MG PO CAPS
0.4000 mg | ORAL_CAPSULE | Freq: Every day | ORAL | 0 refills | Status: AC
  Filled 2024-01-13: qty 30, 30d supply, fill #0

## 2024-01-13 MED ORDER — ACETAMINOPHEN 325 MG PO TABS
650.0000 mg | ORAL_TABLET | Freq: Once | ORAL | Status: AC
  Administered 2024-01-13: 650 mg via ORAL

## 2024-01-13 MED ORDER — MORPHINE SULFATE (PF) 4 MG/ML IV SOLN
4.0000 mg | Freq: Once | INTRAVENOUS | Status: AC
  Administered 2024-01-13: 4 mg via INTRAVENOUS
  Filled 2024-01-13: qty 1

## 2024-01-13 MED ORDER — IBUPROFEN 600 MG PO TABS: 600.0000 mg | ORAL_TABLET | Freq: Once | ORAL | Status: DC

## 2024-01-13 MED ORDER — IOHEXOL 350 MG/ML SOLN
75.0000 mL | Freq: Once | INTRAVENOUS | Status: AC | PRN
  Administered 2024-01-13: 75 mL via INTRAVENOUS

## 2024-01-13 MED ORDER — OXYCODONE-ACETAMINOPHEN 5-325 MG PO TABS
1.0000 | ORAL_TABLET | Freq: Four times a day (QID) | ORAL | 0 refills | Status: AC | PRN
  Filled 2024-01-13: qty 12, 3d supply, fill #0

## 2024-01-13 NOTE — ED Provider Notes (Signed)
 " [facility]    CSN: [id number] Arrival date & time: 01/13/24  0831      History   Chief Complaint Chief Complaint  Patient presents with   Fever   Abdominal Pain   Back Pain   Chills    HPI Steven [name] is a 34 y.o. male presenting w fever and abdominal pain.  History nephrolithiasis, syphilis, HIV.  Pt presents c/o fever, RLQ abd pain, chills, back pain x 14 days. Pt states,  There is some protein floating in my urine. I believe my appendix is messed up or I have kidney stones. My pain is a 10. I been taking left over Ibuprofen  from a dental surgery but I only have 2 left. Shows me photos of what appears to be small kidney stones or blood in his urine.  Nausea with vomiting (lats episode 1 week ago), chills. Denies cough, congestion, sore throat. Rates his abdominal pain a 12/10.   No prior history abd surgeries.   HPI  Past Medical History:  Diagnosis Date   Depression 07/09/2016   History of kidney stones    History of syphilis 07/09/2016   HIV (human immunodeficiency virus infection) (HCC) 06/29/2016   Dx 06/10/2016   Kidney stone    kidney stones    Patient Active Problem List   Diagnosis Date Noted   Tobacco abuse 01/13/2024   Depression 05/23/2021   Economic hardship 05/23/2021   High risk homosexual behavior 07/18/2018   Healthcare maintenance 08/25/2016   History of syphilis 07/09/2016   Outbursts of anger 07/09/2016   HIV (human immunodeficiency virus infection) (HCC) 06/29/2016    Past Surgical History:  Procedure Laterality Date   FACIAL LACERATION REPAIR N/A 11/16/2020   Procedure: REPAIR OF COMPLEX  LIP LACERATION;  Surgeon: Llewellyn Gerard LABOR, DO;  Location: [facility] OR;  Service: ENT;  Laterality: N/A;   FINGER SURGERY Left    ring finger   NOSE SURGERY         Home Medications    Prior to Admission medications  Medication Sig Start Date End Date Taking? Authorizing Provider  doravirin-lamivudin-tenofov df (DELSTRIGO )  100-300-300 MG TABS per tablet Take 1 tablet by mouth daily. 12/09/23   Waddell Alan PARAS, RPH-CPP  lamoTRIgine  (LAMICTAL ) 100 MG tablet Take 1 tablet (100 mg total) by mouth daily. 10/16/22   Calone, Gregory D, FNP  lamoTRIgine  (LAMICTAL ) 25 MG tablet Take 2 tablets (50 mg total) by mouth daily. 07/29/23     QUEtiapine  (SEROQUEL ) 50 MG tablet Take 1 tablet (50 mg total) by mouth at bedtime. 10/16/22   Calone, Gregory D, FNP  QUEtiapine  (SEROQUEL ) 50 MG tablet Take 1 tablet by mouth daily at bedtime 07/16/23     QUEtiapine  (SEROQUEL ) 50 MG tablet Take 1 tablet by mouth daily at bedtime 07/29/23       Family History Family History  Problem Relation Age of Onset   Mental illness Mother     Social History Social History[1]   Allergies   Patient has no known allergies.   Review of Systems Review of Systems  Gastrointestinal:  Positive for abdominal pain.     Physical Exam Triage Vital Signs ED Triage Vitals  Encounter Vitals Group     BP 01/13/24 1101 132/83     Girls Systolic BP Percentile --      Girls Diastolic BP Percentile --      Boys Systolic BP Percentile --      Boys Diastolic BP Percentile --  Pulse Rate 01/13/24 1101 70     Resp 01/13/24 1101 18     Temp 01/13/24 1101 98.8 F (37.1 C)     Temp Source 01/13/24 1101 Oral     SpO2 01/13/24 1101 97 %     Weight 01/13/24 1100 246 lb 14.6 oz (112 kg)     Height --      Head Circumference --      Peak Flow --      Pain Score 01/13/24 1059 10     Pain Loc --      Pain Education --      Exclude from Growth Chart --    No data found.  Updated Vital Signs BP 132/83 (BP Location: Left Arm)   Pulse 70   Temp 98.8 F (37.1 C) (Oral)   Resp 18   Wt 246 lb 14.6 oz (112 kg)   SpO2 97%   BMI 35.43 kg/m   Visual Acuity Right Eye Distance:   Left Eye Distance:   Bilateral Distance:    Right Eye Near:   Left Eye Near:    Bilateral Near:     Physical Exam Vitals reviewed.  Constitutional:      General: He  is not in acute distress.    Appearance: Normal appearance. He is not ill-appearing.  HENT:     Head: Normocephalic and atraumatic.     Mouth/Throat:     Mouth: Mucous membranes are moist.     Comments: Moist mucous membranes Eyes:     Extraocular Movements: Extraocular movements intact.     Pupils: Pupils are equal, round, and reactive to light.  Cardiovascular:     Rate and Rhythm: Normal rate and regular rhythm.     Heart sounds: Normal heart sounds.  Pulmonary:     Effort: Pulmonary effort is normal.     Breath sounds: Normal breath sounds. No wheezing, rhonchi or rales.  Abdominal:     General: Bowel sounds are normal. There is no distension.     Palpations: Abdomen is soft. There is no mass.     Tenderness: There is abdominal tenderness in the right lower quadrant. There is no right CVA tenderness, left CVA tenderness, guarding or rebound. Negative signs include Murphy's sign, Rovsing's sign and McBurney's sign.     Comments: Exquisitely tender to palpation in the right lower quadrant  Skin:    General: Skin is warm.     Capillary Refill: Capillary refill takes less than 2 seconds.     Comments: Good skin turgor  Neurological:     General: No focal deficit present.     Mental Status: He is alert and oriented to person, place, and time.  Psychiatric:        Mood and Affect: Mood normal.        Behavior: Behavior normal.      UC Treatments / Results  Labs (all labs ordered are listed, but only abnormal results are displayed) Labs Reviewed  POCT URINE DIPSTICK - Abnormal; Notable for the following components:      Result Value   Color, UA other (*)    Clarity, UA cloudy (*)    Blood, UA small (*)    All other components within normal limits    EKG   Radiology No results found.  Procedures Procedures (including critical care time)  Medications Ordered in UC Medications  acetaminophen  (TYLENOL ) tablet 650 mg (650 mg Oral Given 01/13/24 1134)    Initial  Impression /  Assessment and Plan / UC Course  I have reviewed the triage vital signs and the nursing notes.  Pertinent labs & imaging results that were available during my care of the patient were reviewed by me and considered in my medical decision making (see chart for details).     Patient is a pleasant 34 y.o. male presenting with RLQ pain. The patient is afebrile and nontachycardic.  Antipyretic has not been administered today.  On exam, he is exquisitely tender to palpation in the right lower quadrant, without guarding or rebound.  Urine with small blood, negative leuk, negative nitrite.  DDx is appendicitis versus nephrolithiasis.  I recommended evaluation in the emergency department.  He is stable for transport in POV. He is in agreement.   Final Clinical Impressions(s) / UC Diagnoses   Final diagnoses:  Acute right lower quadrant pain     Discharge Instructions      -Please head straight to the ER. You may have appendicitis. Alternatively, you might have a kidney stone - and sometimes these need to be removed surgically. You need imaging and bloodwork that we can't do at urgent care. If you need surgery, you would also need to be at the ER. Please head straight to the ER.     ED Prescriptions   None    PDMP not reviewed this encounter.     [1]  Social History Tobacco Use   Smoking status: Every Day    Types: Cigars    Passive exposure: Past   Smokeless tobacco: Never  Vaping Use   Vaping status: Every Day   Substances: Nicotine  Substance Use Topics   Alcohol use: Not Currently    Comment: socially   Drug use: Yes    Types: Marijuana     Arlyss Leita BRAVO, PA-C 01/13/24 1152  "

## 2024-01-13 NOTE — ED Notes (Signed)
Pt was given discharge instructions and verbalized understanding.

## 2024-01-13 NOTE — Discharge Instructions (Addendum)
-  Please head straight to the ER. You may have appendicitis. Alternatively, you might have a kidney stone - and sometimes these need to be removed surgically. You need imaging and bloodwork that we can't do at urgent care. If you need surgery, you would also need to be at the ER. Please head straight to the ER.

## 2024-01-13 NOTE — ED Provider Triage Note (Signed)
 Emergency Medicine Provider Triage Evaluation Note  Steven [name] , a 34 y.o. male  was evaluated in triage.  Pt complains of RLQ pain and R flank pain off and on for the past two weeks. He reports he is having some dysuria. Feels like he is passing balls in his urine. Subjective fevers and feeling hot.   Review of Systems  Positive:  Negative:   Physical Exam  BP (!) 144/94 (BP Location: Right Arm)   Pulse 80   Temp 98 F (36.7 C) (Oral)   Resp 18   Ht 5' 11 (1.803 m)   Wt 115.2 kg   SpO2 91%   BMI 35.43 kg/m  Gen:   Awake, no distress   Resp:  Normal effort  MSK:   Moves extremities without difficulty  Other:  RLQ tenderness to palpation. Soft. R flank tenderness.   Medical Decision Making  Medically screening exam initiated at 2:05 PM.  Appropriate orders placed.  Shaka Cardin was informed that the remainder of the evaluation will be completed by another provider, this initial triage assessment does not replace that evaluation, and the importance of remaining in the ED until their evaluation is complete.  CT and labs ordered.    Bernis Ernst, PA-C 01/13/24 1409

## 2024-01-13 NOTE — Discharge Instructions (Signed)
You were seen in the emergency department and found to have a kidney stone.  Please take ibuprofen as needed for pain.  We are sending you home with multiple medications to assist with passing the stone and for residual pain/nausea:  -Flomax-this is a medication to help pass the stone, it allows urine to exit the body more freely.  Please take this once daily with a meal.  -Percocet-this is a narcotic/controlled substance medication that has potential addicting qualities.  We recommend that you take 1-2 tablets every 6 hours as needed for severe pain.  Do not drive or operate heavy machinery when taking this medicine as it can be sedating. Do not drink alcohol or take other sedating medications when taking this medicine for safety reasons.  Keep this out of reach of small children.  Please be aware this medicine has Tylenol in it (325 mg/tab) do not exceed the maximum dose of Tylenol in a day per over the counter recommendations should you decide to supplement with Tylenol over the counter.   -Zofran-this is an antinausea medication, you may take this every 8 hours as needed for nausea and vomiting, please allow the tablet to dissolve underneath of your tongue.   We have prescribed you new medication(s) today. Discuss the medications prescribed today with your pharmacist as they can have adverse effects and interactions with your other medicines including over the counter and prescribed medications. Seek medical evaluation if you start to experience new or abnormal symptoms after taking one of these medicines, seek care immediately if you start to experience difficulty breathing, feeling of your throat closing, facial swelling, or rash as these could be indications of a more serious allergic reaction  Please follow-up with the urology group provided in your discharge instructions within 3 to 5 days.  Return to the ER for new or worsening symptoms including but not limited to worsening pain not controlled  by these medicines, inability to keep fluids down, fever, or any other concerns that you may have.

## 2024-01-13 NOTE — ED Provider Notes (Signed)
 " [employer] EMERGENCY DEPARTMENT AT [facility] Provider Note   CSN: [id number] Arrival date & time: 01/13/24  1341     Patient presents with: Flank Pain   Steven [name] is a 34 y.o. male with past medical history seen for HIV, syphilis, previous history of kidney stones who presents concern for right flank pain, nausea, vomiting, chills.  Ongoing for around 2 weeks.  Reports history of kidney stones and that this feels similar.  Denies any fever.  Endorses 10/10 pain.  Reports he is taking some ibuprofen  leftover from a dental surgery but does not have any more left.    Flank Pain       Prior to Admission medications  Medication Sig Start Date End Date Taking? Authorizing Provider  ondansetron  (ZOFRAN -ODT) 4 MG disintegrating tablet Take 1 tablet (4 mg total) by mouth every 6 (six) hours as needed for nausea or vomiting. 01/13/24  Yes Lawton Dollinger H, PA-C  oxyCODONE -acetaminophen  (PERCOCET/ROXICET) 5-325 MG tablet Take 1 tablet by mouth every 6 (six) hours as needed for severe pain (pain score 7-10). 01/13/24  Yes Karon Heckendorn H, PA-C  tamsulosin  (FLOMAX ) 0.4 MG CAPS capsule Take 1 capsule (0.4 mg total) by mouth daily. 01/13/24  Yes Aprill Banko H, PA-C  doravirin-lamivudin-tenofov df (DELSTRIGO ) 100-300-300 MG TABS per tablet Take 1 tablet by mouth daily. 12/09/23   Waddell Alan PARAS, RPH-CPP  lamoTRIgine  (LAMICTAL ) 100 MG tablet Take 1 tablet (100 mg total) by mouth daily. 10/16/22   Calone, Gregory D, FNP  lamoTRIgine  (LAMICTAL ) 25 MG tablet Take 2 tablets (50 mg total) by mouth daily. 07/29/23     QUEtiapine  (SEROQUEL ) 50 MG tablet Take 1 tablet (50 mg total) by mouth at bedtime. 10/16/22   Calone, Gregory D, FNP  QUEtiapine  (SEROQUEL ) 50 MG tablet Take 1 tablet by mouth daily at bedtime 07/16/23     QUEtiapine  (SEROQUEL ) 50 MG tablet Take 1 tablet by mouth daily at bedtime 07/29/23       Allergies: Patient has no known allergies.    Review of Systems   Genitourinary:  Positive for flank pain.  All other systems reviewed and are negative.   Updated Vital Signs BP 130/88   Pulse 62   Temp 98.4 F (36.9 C) (Oral)   Resp 18   Ht 5' 11 (1.803 m)   Wt 115.2 kg   SpO2 100%   BMI 35.43 kg/m   Physical Exam Vitals and nursing note reviewed.  Constitutional:      General: He is not in acute distress.    Appearance: Normal appearance.  HENT:     Head: Normocephalic and atraumatic.  Eyes:     General:        Right eye: No discharge.        Left eye: No discharge.  Cardiovascular:     Rate and Rhythm: Normal rate and regular rhythm.     Heart sounds: No murmur heard.    No friction rub. No gallop.  Pulmonary:     Effort: Pulmonary effort is normal.     Breath sounds: Normal breath sounds.  Abdominal:     General: Bowel sounds are normal.     Palpations: Abdomen is soft.     Comments: Right flank tenderness, positive right CVA tenderness  Skin:    General: Skin is warm and dry.     Capillary Refill: Capillary refill takes less than 2 seconds.  Neurological:     Mental Status: He is alert and  oriented to person, place, and time.  Psychiatric:        Mood and Affect: Mood normal.        Behavior: Behavior normal.     (all labs ordered are listed, but only abnormal results are displayed) Labs Reviewed  CBC WITH DIFFERENTIAL/PLATELET - Abnormal; Notable for the following components:      Result Value   MCHC 36.3 (*)    All other components within normal limits  COMPREHENSIVE METABOLIC PANEL WITH GFR - Abnormal; Notable for the following components:   CO2 21 (*)    All other components within normal limits  URINALYSIS, ROUTINE W REFLEX MICROSCOPIC - Abnormal; Notable for the following components:   Hgb urine dipstick SMALL (*)    Ketones, ur 5 (*)    All other components within normal limits    EKG: None  Radiology: CT ABDOMEN PELVIS W CONTRAST Result Date: 01/13/2024 EXAM: CT ABDOMEN AND PELVIS WITH CONTRAST  01/13/2024 04:31:00 PM TECHNIQUE: CT of the abdomen and pelvis was performed with the administration of 75 mL of iohexol  (OMNIPAQUE ) 350 MG/ML injection. Multiplanar reformatted images are provided for review. Automated exposure control, iterative reconstruction, and/or weight-based adjustment of the mA/kV was utilized to reduce the radiation dose to as low as reasonably achievable. COMPARISON: 06/24/2017 CLINICAL HISTORY: RLQ abdominal pain FINDINGS: LOWER CHEST: No acute abnormality. LIVER: The liver is unremarkable. GALLBLADDER AND BILE DUCTS: Gallbladder is unremarkable. No biliary ductal dilatation. SPLEEN: No acute abnormality. PANCREAS: No acute abnormality. ADRENAL GLANDS: No acute abnormality. KIDNEYS, URETERS AND BLADDER: Left kidney: 2.3 cm left interpolar region cyst. Per consensus, no follow-up is needed for simple Bosniak type 1 and 2 renal cysts, unless the patient has a malignancy history or risk factors. Right kidney: There is delayed perfusion in the right kidney with perinephric stranding. Mild right-sided hydronephrosis. Right ureter: Mild right-sided hydroureter due to an obstructive calculus in the mid right ureter, measuring 2 x 3 x 8 mm (AP x transverse x craniocaudal). Urinary bladder: The urinary bladder is decompressed, without focal abnormality. GI AND BOWEL: Stomach demonstrates no acute abnormality. Normal appendix. Scattered descending colonic diverticulosis. No changes of acute diverticulitis. There is no bowel obstruction. PERITONEUM AND RETROPERITONEUM: No ascites. No free air. No free pelvic fluid. VASCULATURE: Aorta is normal in caliber. LYMPH NODES: No lymphadenopathy. REPRODUCTIVE ORGANS: No prostatomegaly. 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 No acute osseous abnormality. No focal soft tissue abnormality. IMPRESSION: 1. Mild right-sided hydroureteronephrosis due to an obstructive calculus in the mid right ureter, measuring 2 x  3 x 8 mm (AP x transverse x craniocaudal). 2. Scattered descending colonic diverticulosis. No changes of acute diverticulitis. Electronically signed by: Rogelia Myers MD MD 01/13/2024 05:05 PM EST RP Workstation: [id number]     Procedures   Medications Ordered in the ED  ketorolac  (TORADOL ) 15 MG/ML injection 15 mg (15 mg Intravenous Given 01/13/24 1429)  iohexol  (OMNIPAQUE ) 350 MG/ML injection 75 mL (75 mLs Intravenous Contrast Given 01/13/24 1632)  morphine  (PF) 4 MG/ML injection 4 mg (4 mg Intravenous Given 01/13/24 1754)  ketorolac  (TORADOL ) 15 MG/ML injection 15 mg (15 mg Intravenous Given 01/13/24 1753)                                    Medical Decision Making Risk Prescription drug management.   This patient is a 34 y.o. male  who presents to the  ED for concern of flank pain.   Differential diagnoses prior to evaluation: The emergent differential diagnosis includes, but is not limited to,  AAA, renal vascular thrombosis, mesenteric ischemia, pyelonephritis, nephrolithiasis, cystitis, biliary colic, pancreatitis, PUD, appendicitis, diverticulitis, bowel obstruction . This is not an exhaustive differential.   Past Medical History / Co-morbidities / Social History: HIV, syphilis, previous history of kidney stones  Physical Exam: Physical exam performed. The pertinent findings include: Right flank tenderness, positive right CVA tenderness   Vital signs stable in ED  Lab Tests/Imaging studies: I personally interpreted labs/imaging and the pertinent results include: CBC unremarkable, CMP overall unremarkable.  UA with small hemoglobin, 5 ketones, no evidence of infection, no white blood cells or bacteria noted.  [place] interpreted CT abdomen pelvis with contrast which shows  1. Mild right-sided hydroureteronephrosis due to an obstructive calculus in the  mid right ureter, measuring 2 x 3 x 8 mm (AP x transverse x craniocaudal).  2. Scattered descending colonic diverticulosis. No  changes of acute  diverticulitis.  . I agree with the radiologist interpretation.  Medications: I ordered medication including morphine , toradol  for pain.  I have reviewed the patients home medicines and have made adjustments as needed.   Disposition: After consideration of the diagnostic results and the patients response to treatment, I feel that pain under control in the emergency department.  Patient does have a large stone, will likely need urology follow-up given the duration of time without passage, may need stent or lithotripsy, but able to control with oral medication, trial of Flomax , return precautions given, no evidence for infected stone.   emergency department workup does not suggest an emergent condition requiring admission or immediate intervention beyond what has been performed at this time. The plan is: as above. The patient is safe for discharge and has been instructed to return immediately for worsening symptoms, change in symptoms or any other concerns.   Final diagnoses:  Kidney stone    ED Discharge Orders          Ordered    ondansetron  (ZOFRAN -ODT) 4 MG disintegrating tablet  Every 6 hours PRN        01/13/24 1839    tamsulosin  (FLOMAX ) 0.4 MG CAPS capsule  Daily        01/13/24 1839    oxyCODONE -acetaminophen  (PERCOCET/ROXICET) 5-325 MG tablet  Every 6 hours PRN        01/13/24 1839               Leyland Kenna H, PA-C 01/13/24 1839    Dreama Longs, MD 01/14/24 1250  "

## 2024-01-13 NOTE — ED Triage Notes (Addendum)
 Pt presents c/o fever, LRQ abd pain, chills, back pain x 14 days. Pt states,  There is some protein floating in my urine. I believe my appendix is messed up or I have kidney stones. My pain is a 10. I been taking left over Ibuprofen  from a dental surgery but I only have 2 left.

## 2024-01-13 NOTE — ED Triage Notes (Signed)
 Pt c/o two weeks of worsening R flank pain with N/V, chills. Pt reports hx of kidney stones and this feels similar.

## 2024-01-14 ENCOUNTER — Other Ambulatory Visit (HOSPITAL_COMMUNITY): Payer: Self-pay

## 2024-01-17 ENCOUNTER — Emergency Department (HOSPITAL_COMMUNITY)
Admission: EM | Admit: 2024-01-17 | Discharge: 2024-01-17 | Discharge status: Against Medical Advice | Payer: MEDICAID | Attending: Emergency Medicine | Admitting: Emergency Medicine

## 2024-01-17 ENCOUNTER — Other Ambulatory Visit (HOSPITAL_COMMUNITY): Payer: Self-pay

## 2024-01-17 DIAGNOSIS — N132 Hydronephrosis with renal and ureteral calculous obstruction: Secondary | ICD-10-CM | POA: Diagnosis present

## 2024-01-17 DIAGNOSIS — Z5321 Procedure and treatment not carried out due to patient leaving prior to being seen by health care provider: Secondary | ICD-10-CM | POA: Insufficient documentation

## 2024-01-17 NOTE — ED Triage Notes (Signed)
 Pt seen on 1/8 and dx with kidney stone. Pt said he needs some medicine for pain, says a prescription was sent to the pharmacy for him but he doesn't have a job or a way to pay for the medications and requested some Tylenol  on arrival.

## 2024-01-26 ENCOUNTER — Ambulatory Visit: Payer: MEDICAID | Admitting: Family

## 2024-01-27 ENCOUNTER — Other Ambulatory Visit (HOSPITAL_COMMUNITY): Payer: Self-pay

## 2024-01-28 ENCOUNTER — Other Ambulatory Visit (HOSPITAL_COMMUNITY): Payer: Self-pay

## 2024-01-31 ENCOUNTER — Other Ambulatory Visit: Payer: Self-pay

## 2024-01-31 ENCOUNTER — Other Ambulatory Visit (HOSPITAL_COMMUNITY): Payer: Self-pay

## 2024-01-31 MED ORDER — QUETIAPINE FUMARATE 50 MG PO TABS
ORAL_TABLET | ORAL | 1 refills | Status: AC
  Filled 2024-01-31: qty 31, 17d supply, fill #0

## 2024-02-02 ENCOUNTER — Other Ambulatory Visit: Payer: Self-pay

## 2024-02-02 ENCOUNTER — Other Ambulatory Visit (HOSPITAL_COMMUNITY): Payer: Self-pay

## 2024-02-02 ENCOUNTER — Other Ambulatory Visit: Payer: Self-pay | Admitting: Pharmacy Technician

## 2024-02-02 NOTE — Progress Notes (Signed)
 Specialty Pharmacy Refill Coordination Note  Steven [name] is a 34 y.o. male contacted today regarding refills of specialty medication(s) Doravirin-Lamivudin-Tenofov DF (Delstrigo )   Patient requested Delivery   Delivery date: 02/03/24   Verified address: [address]  [place] [address]   Medication will be filled on: 02/02/24
